# Patient Record
Sex: Female | Born: 1980 | Race: Black or African American | Hispanic: No | Marital: Single | Smoking: Former smoker
Health system: Southern US, Community
[De-identification: ages and names within clinical notes are randomized; demographics above are authoritative.]

## PROBLEM LIST (undated history)

## (undated) ENCOUNTER — Emergency Department: Admission: EM | Payer: Managed Care, Other (non HMO) | Source: Home / Self Care

## (undated) DIAGNOSIS — E785 Hyperlipidemia, unspecified: Secondary | ICD-10-CM

## (undated) DIAGNOSIS — F419 Anxiety disorder, unspecified: Secondary | ICD-10-CM

## (undated) DIAGNOSIS — R87619 Unspecified abnormal cytological findings in specimens from cervix uteri: Secondary | ICD-10-CM

## (undated) DIAGNOSIS — F32A Depression, unspecified: Secondary | ICD-10-CM

## (undated) DIAGNOSIS — J189 Pneumonia, unspecified organism: Secondary | ICD-10-CM

## (undated) DIAGNOSIS — I10 Essential (primary) hypertension: Secondary | ICD-10-CM

## (undated) DIAGNOSIS — E559 Vitamin D deficiency, unspecified: Secondary | ICD-10-CM

## (undated) DIAGNOSIS — R7303 Prediabetes: Secondary | ICD-10-CM

## (undated) DIAGNOSIS — D509 Iron deficiency anemia, unspecified: Secondary | ICD-10-CM

## (undated) HISTORY — DX: Unspecified abnormal cytological findings in specimens from cervix uteri: R87.619

## (undated) HISTORY — PX: NO PAST SURGERIES: SHX2092

## (undated) HISTORY — DX: Essential (primary) hypertension: I10

---

## 2004-10-06 ENCOUNTER — Other Ambulatory Visit: Admission: RE | Admit: 2004-10-06 | Discharge: 2004-10-06 | Payer: Self-pay | Admitting: Family Medicine

## 2007-10-27 ENCOUNTER — Emergency Department (HOSPITAL_COMMUNITY): Admission: EM | Admit: 2007-10-27 | Discharge: 2007-10-27 | Payer: Self-pay | Admitting: Family Medicine

## 2010-01-06 ENCOUNTER — Emergency Department (HOSPITAL_COMMUNITY): Admission: EM | Admit: 2010-01-06 | Discharge: 2010-01-06 | Payer: Self-pay | Admitting: Emergency Medicine

## 2011-02-25 LAB — POCT PREGNANCY, URINE
Operator id: 247071
Preg Test, Ur: NEGATIVE

## 2014-11-19 ENCOUNTER — Ambulatory Visit: Payer: Self-pay | Admitting: Family Medicine

## 2014-11-29 ENCOUNTER — Other Ambulatory Visit: Payer: Self-pay | Admitting: Family Medicine

## 2014-11-29 MED ORDER — AMLODIPINE BESYLATE 10 MG PO TABS: 10.0000 mg | ORAL_TABLET | Freq: Every day | ORAL | Status: DC

## 2014-11-29 MED ORDER — CHLORTHALIDONE 25 MG PO TABS: 25.0000 mg | ORAL_TABLET | Freq: Every day | ORAL | Status: DC

## 2014-11-29 NOTE — Telephone Encounter (Signed)
Routing to provider  

## 2014-11-29 NOTE — Telephone Encounter (Signed)
Pt called stated she needs a refill on Chlorthalidone and Amlodipine. [facility] [place]

## 2014-12-14 ENCOUNTER — Ambulatory Visit (INDEPENDENT_AMBULATORY_CARE_PROVIDER_SITE_OTHER): Payer: 59 | Admitting: Family Medicine

## 2014-12-14 ENCOUNTER — Encounter: Payer: Self-pay | Admitting: Family Medicine

## 2014-12-14 VITALS — BP 143/87 | HR 92 | Temp 98.8°F | Ht 66.75 in | Wt 327.0 lb

## 2014-12-14 DIAGNOSIS — Z0000 Encounter for general adult medical examination without abnormal findings: Secondary | ICD-10-CM

## 2014-12-14 DIAGNOSIS — I10 Essential (primary) hypertension: Secondary | ICD-10-CM | POA: Diagnosis not present

## 2014-12-14 DIAGNOSIS — Z23 Encounter for immunization: Secondary | ICD-10-CM

## 2014-12-14 DIAGNOSIS — R87810 Cervical high risk human papillomavirus (HPV) DNA test positive: Secondary | ICD-10-CM

## 2014-12-14 MED ORDER — CHLORTHALIDONE 25 MG PO TABS: 37.5000 mg | ORAL_TABLET | Freq: Every day | ORAL | Status: DC

## 2014-12-14 MED ORDER — TETANUS-DIPHTH-ACELL PERTUSSIS 5-2.5-18.5 LF-MCG/0.5 IM SUSP
0.5000 mL | Freq: Once | INTRAMUSCULAR | Status: AC
  Administered 2014-12-14: 0.5 mL via INTRAMUSCULAR

## 2014-12-14 MED ORDER — AMLODIPINE BESYLATE 10 MG PO TABS: 10.0000 mg | ORAL_TABLET | Freq: Every day | ORAL | Status: DC

## 2014-12-14 NOTE — Progress Notes (Signed)
BP 143/87 mmHg  Pulse 92  Temp(Src) 98.8 F (37.1 C)  Ht 5' 6.75" (1.695 m)  Wt 327 lb (148.326 kg)  BMI 51.63 kg/m2  SpO2 99%  LMP 11/25/2014 (Approximate)   Subjective:    Patient ID: Nancy Garza, female    DOB: Mar 15, 1981, 34 y.o.   MRN: [id number]  HPI: Nancy Garza is a 34 y.o. female  Chief Complaint  Patient presents with  . Annual Exam  abnormal pap smear; HPV positive; had LEEP in August 2013; none since then She is losing weight; up to 357 pounds and goes to the gym four days per week; drinking water now Tries to bake things; used to indulge in fried chicken, now just a special occasion now, maybe just on a Sunday She cannot recall her last tetanus  Depression screen PHQ 2/9 12/14/2014  Decreased Interest 0  Down, Depressed, Hopeless 0  PHQ - 2 Score 0   Relevant past medical, surgical, family and social history reviewed and updated as indicated. Interim medical history since our last visit reviewed. Allergies and medications reviewed and updated.  Review of Systems  Constitutional: Negative for fever and unexpected weight change.  HENT: Negative for sore throat.   Respiratory: Negative for shortness of breath.   Cardiovascular: Negative for chest pain.  Gastrointestinal: Negative for nausea, diarrhea and blood in stool.  Endocrine: Negative for cold intolerance, heat intolerance, polydipsia, polyphagia and polyuria.  Genitourinary: Negative for dysuria, frequency, hematuria, vaginal bleeding, vaginal discharge and vaginal pain.  Neurological: Negative for headaches.  Hematological: Does not bruise/bleed easily.   Per HPI unless specifically indicated above     Objective:    BP 143/87 mmHg  Pulse 92  Temp(Src) 98.8 F (37.1 C)  Ht 5' 6.75" (1.695 m)  Wt 327 lb (148.326 kg)  BMI 51.63 kg/m2  SpO2 99%  LMP 11/25/2014 (Approximate)  Wt Readings from Last 3 Encounters:  12/14/14 327 lb (148.326 kg)  10/05/14 347 lb (157.398 kg)     Physical Exam  Constitutional: She appears well-developed and well-nourished.  HENT:  Head: Normocephalic and atraumatic.  Eyes: Conjunctivae and EOM are normal. Right eye exhibits no hordeolum. Left eye exhibits no hordeolum. No scleral icterus.  Neck: Carotid bruit is not present. No thyromegaly present.  Cardiovascular: Normal rate, regular rhythm, S1 normal, S2 normal and normal heart sounds.   No extrasystoles are present.  Pulmonary/Chest: Effort normal and breath sounds normal. No respiratory distress. Right breast exhibits no inverted nipple, no mass, no nipple discharge, no skin change and no tenderness. Left breast exhibits no inverted nipple, no mass, no nipple discharge, no skin change and no tenderness. Breasts are symmetrical.  Abdominal: Soft. Normal appearance and bowel sounds are normal. She exhibits no distension, no abdominal bruit, no pulsatile midline mass and no mass. There is no hepatosplenomegaly. There is no tenderness. No hernia.  Genitourinary: Uterus normal. Pelvic exam was performed with patient prone. There is no rash or lesion on the right labia. There is no rash or lesion on the left labia. Cervix exhibits no motion tenderness. Right adnexum displays no mass, no tenderness and no fullness. Left adnexum displays no mass, no tenderness and no fullness.  Musculoskeletal: Normal range of motion. She exhibits no edema.  Lymphadenopathy:       Head (right side): No submandibular adenopathy present.       Head (left side): No submandibular adenopathy present.    She has no cervical adenopathy.    She  has no axillary adenopathy.  Neurological: She is alert. She displays no tremor. No cranial nerve deficit. She exhibits normal muscle tone. Gait normal.  Skin: Skin is warm and dry. No bruising and no ecchymosis noted. No cyanosis. No pallor.  Psychiatric: Her speech is normal and behavior is normal. Thought content normal. Her mood appears not anxious. She does not exhibit a  depressed mood.      Assessment & Plan:   Problem List Items Addressed This Visit      Cardiovascular and Mediastinum   Benign hypertension   Relevant Medications   chlorthalidone (HYGROTON) 25 MG tablet   amLODipine (NORVASC) 10 MG tablet     Other   Morbid obesity    Praised patient for her weight loss efforts, encouragement given       Other Visit Diagnoses    Health maintenance examination    -  Primary    healthy living encouraged; age-appropriate guidance, counseling    Relevant Medications    Tdap (BOOSTRIX) injection 0.5 mL (Completed)    Need for prophylactic vaccination with combined diphtheria-tetanus-pertussis (DTP) vaccine        vaccine given; tetanus good for up to 10 years; pertussis good for life per current ACIP guidelines    Relevant Medications    Tdap (BOOSTRIX) injection 0.5 mL (Completed)    Cervical high risk HPV (human papillomavirus) test positive        thin prep collected    Relevant Orders    Pap liquid-based and HPV (high risk)       Follow up plan: Return in about 1 month (around 01/14/2015) for high blood pressure and fasting labs; return in 12+ months for physical.  Meds ordered this encounter  Medications  . Tdap (BOOSTRIX) injection 0.5 mL    Sig:   . DISCONTD: chlorthalidone (HYGROTON) 25 MG tablet    Sig: Take 1.5 tablets (37.5 mg total) by mouth daily.    Dispense:  45 tablet    Refill:  2  . chlorthalidone (HYGROTON) 25 MG tablet    Sig: Take 1.5 tablets (37.5 mg total) by mouth daily.    Dispense:  135 tablet    Refill:  1  . amLODipine (NORVASC) 10 MG tablet    Sig: Take 1 tablet (10 mg total) by mouth daily.    Dispense:  90 tablet    Refill:  3

## 2014-12-14 NOTE — Patient Instructions (Addendum)
You received the tetanus-diphtheria-pertussis vaccine today The tetanus-diphtheria portion is good for up to 10 years The pertussis component should be good for life Let's increase the chlorthalidone to 1.5 pills daily (37.5 mg) Return for recheck of blood pressure and fasting labs in 1 month Return in 12 months for next physical  Health Maintenance Adopting a healthy lifestyle and getting preventive care can go a long way to promote health and wellness. Talk with your health care provider about what schedule of regular examinations is right for you. This is a good chance for you to check in with your provider about disease prevention and staying healthy. In between checkups, there are plenty of things you can do on your own. Experts have done a lot of research about which lifestyle changes and preventive measures are most likely to keep you healthy. Ask your health care provider for more information. WEIGHT AND DIET  Eat a healthy diet  Be sure to include plenty of vegetables, fruits, low-fat dairy products, and lean protein.  Do not eat a lot of foods high in solid fats, added sugars, or salt.  Get regular exercise. This is one of the most important things you can do for your health.  Most adults should exercise for at least 150 minutes each week. The exercise should increase your heart rate and make you sweat (moderate-intensity exercise).  Most adults should also do strengthening exercises at least twice a week. This is in addition to the moderate-intensity exercise.  Maintain a healthy weight  Body mass index (BMI) is a measurement that can be used to identify possible weight problems. It estimates body fat based on height and weight. Your health care provider can help determine your BMI and help you achieve or maintain a healthy weight.  For females 68 years of age and older:   A BMI below 18.5 is considered underweight.  A BMI of 18.5 to 24.9 is normal.  A BMI of 25 to 29.9  is considered overweight.  A BMI of 30 and above is considered obese.  Watch levels of cholesterol and blood lipids  You should start having your blood tested for lipids and cholesterol at 33 years of age, then have this test every 5 years.  You may need to have your cholesterol levels checked more often if:  Your lipid or cholesterol levels are high.  You are older than 33 years of age.  You are at high risk for heart disease.  CANCER SCREENING   Lung Cancer  Lung cancer screening is recommended for adults 73-76 years old who are at high risk for lung cancer because of a history of smoking.  A yearly low-dose CT scan of the lungs is recommended for people who:  Currently smoke.  Have quit within the past 15 years.  Have at least a 30-pack-year history of smoking. A pack year is smoking an average of one pack of cigarettes a day for 1 year.  Yearly screening should continue until it has been 15 years since you quit.  Yearly screening should stop if you develop a health problem that would prevent you from having lung cancer treatment.  Breast Cancer  Practice breast self-awareness. This means understanding how your breasts normally appear and feel.  It also means doing regular breast self-exams. Let your health care provider know about any changes, no matter how small.  If you are in your 20s or 30s, you should have a clinical breast exam (CBE) by a health care provider  every 1-3 years as part of a regular health exam.  If you are 31 or older, have a CBE every year. Also consider having a breast X-ray (mammogram) every year.  If you have a family history of breast cancer, talk to your health care provider about genetic screening.  If you are at high risk for breast cancer, talk to your health care provider about having an MRI and a mammogram every year.  Breast cancer gene (BRCA) assessment is recommended for women who have family members with BRCA-related cancers.  BRCA-related cancers include:  Breast.  Ovarian.  Tubal.  Peritoneal cancers.  Results of the assessment will determine the need for genetic counseling and BRCA1 and BRCA2 testing. Cervical Cancer Routine pelvic examinations to screen for cervical cancer are no longer recommended for nonpregnant women who are considered low risk for cancer of the pelvic organs (ovaries, uterus, and vagina) and who do not have symptoms. A pelvic examination may be necessary if you have symptoms including those associated with pelvic infections. Ask your health care provider if a screening pelvic exam is right for you.   The Pap test is the screening test for cervical cancer for women who are considered at risk.  If you had a hysterectomy for a problem that was not cancer or a condition that could lead to cancer, then you no longer need Pap tests.  If you are older than 65 years, and you have had normal Pap tests for the past 10 years, you no longer need to have Pap tests.  If you have had past treatment for cervical cancer or a condition that could lead to cancer, you need Pap tests and screening for cancer for at least 20 years after your treatment.  If you no longer get a Pap test, assess your risk factors if they change (such as having a new sexual partner). This can affect whether you should start being screened again.  Some women have medical problems that increase their chance of getting cervical cancer. If this is the case for you, your health care provider may recommend more frequent screening and Pap tests.  The human papillomavirus (HPV) test is another test that may be used for cervical cancer screening. The HPV test looks for the virus that can cause cell changes in the cervix. The cells collected during the Pap test can be tested for HPV.  The HPV test can be used to screen women 81 years of age and older. Getting tested for HPV can extend the interval between normal Pap tests from three to  five years.  An HPV test also should be used to screen women of any age who have unclear Pap test results.  After 33 years of age, women should have HPV testing as often as Pap tests.  Colorectal Cancer  This type of cancer can be detected and often prevented.  Routine colorectal cancer screening usually begins at 33 years of age and continues through 33 years of age.  Your health care provider may recommend screening at an earlier age if you have risk factors for colon cancer.  Your health care provider may also recommend using home test kits to check for hidden blood in the stool.  A small camera at the end of a tube can be used to examine your colon directly (sigmoidoscopy or colonoscopy). This is done to check for the earliest forms of colorectal cancer.  Routine screening usually begins at age 72.  Direct examination of the colon should  be repeated every 5-10 years through 33 years of age. However, you may need to be screened more often if early forms of precancerous polyps or small growths are found. Skin Cancer  Check your skin from head to toe regularly.  Tell your health care provider about any new moles or changes in moles, especially if there is a change in a mole's shape or color.  Also tell your health care provider if you have a mole that is larger than the size of a pencil eraser.  Always use sunscreen. Apply sunscreen liberally and repeatedly throughout the day.  Protect yourself by wearing long sleeves, pants, a wide-brimmed hat, and sunglasses whenever you are outside. HEART DISEASE, DIABETES, AND HIGH BLOOD PRESSURE   Have your blood pressure checked at least every 1-2 years. High blood pressure causes heart disease and increases the risk of stroke.  If you are between 62 years and 52 years old, ask your health care provider if you should take aspirin to prevent strokes.  Have regular diabetes screenings. This involves taking a blood sample to check your  fasting blood sugar level.  If you are at a normal weight and have a low risk for diabetes, have this test once every three years after 33 years of age.  If you are overweight and have a high risk for diabetes, consider being tested at a younger age or more often. PREVENTING INFECTION  Hepatitis B  If you have a higher risk for hepatitis B, you should be screened for this virus. You are considered at high risk for hepatitis B if:  You were born in a country where hepatitis B is common. Ask your health care provider which countries are considered high risk.  Your parents were born in a high-risk country, and you have not been immunized against hepatitis B (hepatitis B vaccine).  You have HIV or AIDS.  You use needles to inject street drugs.  You live with someone who has hepatitis B.  You have had sex with someone who has hepatitis B.  You get hemodialysis treatment.  You take certain medicines for conditions, including cancer, organ transplantation, and autoimmune conditions. Hepatitis C  Blood testing is recommended for:  Everyone born from [birth year] through 1964.  Anyone with known risk factors for hepatitis C. Sexually transmitted infections (STIs)  You should be screened for sexually transmitted infections (STIs) including gonorrhea and chlamydia if:  You are sexually active and are younger than 33 years of age.  You are older than 33 years of age and your health care provider tells you that you are at risk for this type of infection.  Your sexual activity has changed since you were last screened and you are at an increased risk for chlamydia or gonorrhea. Ask your health care provider if you are at risk.  If you do not have HIV, but are at risk, it may be recommended that you take a prescription medicine daily to prevent HIV infection. This is called pre-exposure prophylaxis (PrEP). You are considered at risk if:  You are sexually active and do not regularly use condoms or  know the HIV status of your partner(s).  You take drugs by injection.  You are sexually active with a partner who has HIV. Talk with your health care provider about whether you are at high risk of being infected with HIV. If you choose to begin PrEP, you should first be tested for HIV. You should then be tested every 3 months for as  long as you are taking PrEP.  PREGNANCY   If you are premenopausal and you may become pregnant, ask your health care provider about preconception counseling.  If you may become pregnant, take 400 to 800 micrograms (mcg) of folic acid every day.  If you want to prevent pregnancy, talk to your health care provider about birth control (contraception). OSTEOPOROSIS AND MENOPAUSE   Osteoporosis is a disease in which the bones lose minerals and strength with aging. This can result in serious bone fractures. Your risk for osteoporosis can be identified using a bone density scan.  If you are 24 years of age or older, or if you are at risk for osteoporosis and fractures, ask your health care provider if you should be screened.  Ask your health care provider whether you should take a calcium or vitamin D supplement to lower your risk for osteoporosis.  Menopause may have certain physical symptoms and risks.  Hormone replacement therapy may reduce some of these symptoms and risks. Talk to your health care provider about whether hormone replacement therapy is right for you.  HOME CARE INSTRUCTIONS   Schedule regular health, dental, and eye exams.  Stay current with your immunizations.   Do not use any tobacco products including cigarettes, chewing tobacco, or electronic cigarettes.  If you are pregnant, do not drink alcohol.  If you are breastfeeding, limit how much and how often you drink alcohol.  Limit alcohol intake to no more than 1 drink per day for nonpregnant women. One drink equals 12 ounces of beer, 5 ounces of wine, or 1 ounces of hard liquor.  Do  not use street drugs.  Do not share needles.  Ask your health care provider for help if you need support or information about quitting drugs.  Tell your health care provider if you often feel depressed.  Tell your health care provider if you have ever been abused or do not feel safe at home. Document Released: 12/01/2010 Document Revised: 10/02/2013 Document Reviewed: 04/19/2013 [facility] Patient Information 2015 [place], [place]. This information is not intended to replace advice given to you by your health care provider. Make sure you discuss any questions you have with your health care provider.

## 2014-12-16 DIAGNOSIS — I10 Essential (primary) hypertension: Secondary | ICD-10-CM | POA: Insufficient documentation

## 2014-12-16 NOTE — Assessment & Plan Note (Signed)
Praised patient for her weight loss efforts, encouragement given

## 2014-12-19 LAB — PAP LB AND HPV HIGH-RISK
HPV, high-risk: NEGATIVE
PAP Smear Comment: 0

## 2015-01-01 ENCOUNTER — Other Ambulatory Visit: Payer: Self-pay | Admitting: Family Medicine

## 2015-01-01 MED ORDER — CHLORTHALIDONE 25 MG PO TABS: 37.5000 mg | ORAL_TABLET | Freq: Every day | ORAL | Status: DC

## 2015-01-01 MED ORDER — AMLODIPINE BESYLATE 10 MG PO TABS: 10.0000 mg | ORAL_TABLET | Freq: Every day | ORAL | Status: DC

## 2015-01-01 NOTE — Telephone Encounter (Signed)
Pt called needs refills Amlodipine and hygroton. Pharm is [facility]. [facility] stated [facility]es with no response. Thanks.

## 2015-01-01 NOTE — Telephone Encounter (Signed)
She had rx's sent to local pharmacy, but they need [facility] with [facility] supply.

## 2015-01-02 ENCOUNTER — Telehealth: Payer: Self-pay

## 2015-01-02 NOTE — Telephone Encounter (Signed)
Pt called stated her last visit was supposed to be coded as a physical but it was coded as  R87.810. Could you please look into this for this pt and call her @ [phone]. Thanks.

## 2015-01-18 ENCOUNTER — Ambulatory Visit: Payer: 59 | Admitting: Family Medicine

## 2015-01-25 ENCOUNTER — Encounter: Payer: Self-pay | Admitting: Family Medicine

## 2015-01-25 ENCOUNTER — Ambulatory Visit (INDEPENDENT_AMBULATORY_CARE_PROVIDER_SITE_OTHER): Payer: 59 | Admitting: Family Medicine

## 2015-01-25 VITALS — BP 141/83 | HR 83 | Temp 97.7°F | Wt 314.0 lb

## 2015-01-25 DIAGNOSIS — Z0000 Encounter for general adult medical examination without abnormal findings: Secondary | ICD-10-CM | POA: Insufficient documentation

## 2015-01-25 DIAGNOSIS — I10 Essential (primary) hypertension: Secondary | ICD-10-CM | POA: Diagnosis not present

## 2015-01-25 NOTE — Progress Notes (Signed)
BP 141/83 mmHg  Pulse 83  Temp(Src) 97.7 F (36.5 C)  Wt 314 lb (142.429 kg)  SpO2 100%  LMP 01/04/2015 (Approximate)   Subjective:    Patient ID: Nancy Garza, female    DOB: 08-23-1980, 34 y.o.   MRN: [id number]  HPI: Nancy Garza is a 34 y.o. female  Chief Complaint  Patient presents with  . Hypertension  . Obesity    She has lost 13 pounds since her last appointment here!   She is eating right, trying to cut down on things; not depriving herself She doesn't eat fried chicken like she used to; she is allowing herself to have a little something; learning that it's how much, and really limiting portions; she not telling people she is on a diet She is working out 3-4 times a week; no limitations, no chest pain Had a rough day yesterday, all she does is work, works 8-5, gets off at 7 sometimes if she does overtime; goes to [employer], gets ready for the next day, like a constant cycle; she thought this is mental and working on herself physically and mentally  Relevant past medical, surgical, family and social history reviewed and updated as indicated. Interim medical history since our last visit reviewed. Allergies and medications reviewed and updated.  Review of Systems  Constitutional: Negative for fever and unexpected weight change (the weight change is all expected and hard-earned).  Respiratory: Negative for shortness of breath.   Cardiovascular: Negative for chest pain and leg swelling.  Endocrine: Negative for polydipsia.  Psychiatric/Behavioral: The patient is not nervous/anxious and is not hyperactive.    Per HPI unless specifically indicated above     Objective:    BP 141/83 mmHg  Pulse 83  Temp(Src) 97.7 F (36.5 C)  Wt 314 lb (142.429 kg)  SpO2 100%  LMP 01/04/2015 (Approximate)  Wt Readings from Last 3 Encounters:  01/25/15 314 lb (142.429 kg)  12/14/14 327 lb (148.326 kg)  10/05/14 347 lb (157.398 kg)    Physical Exam  Constitutional: She  appears well-developed and well-nourished.  Morbidly obese  HENT:  Mouth/Throat: Mucous membranes are normal.  Eyes: EOM are normal. No scleral icterus.  Cardiovascular: Normal rate and regular rhythm.   Pulmonary/Chest: Effort normal and breath sounds normal.  Psychiatric: She has a normal mood and affect. Her behavior is normal.      Assessment & Plan:   Problem List Items Addressed This Visit      Cardiovascular and Mediastinum   Benign hypertension    Continue current meds; weight loss will definitely help; monitor BP at home and if over 140, then call and we'll increase the chlorthalidone from 37.5 to 50 mg daily, but I think she can do this on her own        Other   Morbid obesity    So excited at her efforts for eating better and losing weight and exercising; continue weight loss journey, physical and mental process discussed; weight loss should also help lower pressures      Preventative health care - Primary    Just entered for lab orders      Relevant Orders   Comprehensive metabolic panel   CBC with Differential/Platelet   TSH   Lipid Panel w/o Chol/HDL Ratio      Follow up plan: Return in about 3 months (around 04/27/2015) for HTN.  Orders Placed This Encounter  Procedures  . Comprehensive metabolic panel  . CBC with Differential/Platelet  .  TSH  . Lipid Panel w/o Chol/HDL Ratio

## 2015-01-25 NOTE — Assessment & Plan Note (Signed)
Continue current meds; weight loss will definitely help; monitor BP at home and if over 140, then call and we'll increase the chlorthalidone from 37.5 to 50 mg daily, but I think she can do this on her own

## 2015-01-25 NOTE — Patient Instructions (Signed)
I am so proud of your efforts We'll check labs today Check your blood pressure maybe once or twice a week, and call me if not consistently under 140 Keep trying to follow the DASH guidelines Return in 3 months, but call sooner if needed

## 2015-01-25 NOTE — Assessment & Plan Note (Signed)
Just entered for lab orders

## 2015-01-25 NOTE — Assessment & Plan Note (Signed)
So excited at her efforts for eating better and losing weight and exercising; continue weight loss journey, physical and mental process discussed; weight loss should also help lower pressures

## 2015-01-26 LAB — CBC WITH DIFFERENTIAL/PLATELET
BASOS: 0 %
Basophils Absolute: 0 10*3/uL (ref 0.0–0.2)
EOS (ABSOLUTE): 0.1 10*3/uL (ref 0.0–0.4)
EOS: 2 %
HEMATOCRIT: 37.4 % (ref 34.0–46.6)
HEMOGLOBIN: 12.5 g/dL (ref 11.1–15.9)
IMMATURE GRANS (ABS): 0 10*3/uL (ref 0.0–0.1)
Immature Granulocytes: 0 %
LYMPHS: 33 %
Lymphocytes Absolute: 2 10*3/uL (ref 0.7–3.1)
MCH: 29.5 pg (ref 26.6–33.0)
MCHC: 33.4 g/dL (ref 31.5–35.7)
MCV: 88 fL (ref 79–97)
Monocytes Absolute: 0.4 10*3/uL (ref 0.1–0.9)
Monocytes: 7 %
NEUTROS ABS: 3.5 10*3/uL (ref 1.4–7.0)
Neutrophils: 58 %
Platelets: 406 10*3/uL — ABNORMAL HIGH (ref 150–379)
RBC: 4.24 x10E6/uL (ref 3.77–5.28)
RDW: 16.3 % — ABNORMAL HIGH (ref 12.3–15.4)
WBC: 6 10*3/uL (ref 3.4–10.8)

## 2015-01-26 LAB — COMPREHENSIVE METABOLIC PANEL
A/G RATIO: 1.4 (ref 1.1–2.5)
ALT: 20 IU/L (ref 0–32)
AST: 19 IU/L (ref 0–40)
Albumin: 4.4 g/dL (ref 3.5–5.5)
Alkaline Phosphatase: 53 IU/L (ref 39–117)
BUN/Creatinine Ratio: 16 (ref 8–20)
BUN: 14 mg/dL (ref 6–20)
Bilirubin Total: 0.4 mg/dL (ref 0.0–1.2)
CALCIUM: 9.8 mg/dL (ref 8.7–10.2)
CO2: 28 mmol/L (ref 18–29)
Chloride: 92 mmol/L — ABNORMAL LOW (ref 97–108)
Creatinine, Ser: 0.87 mg/dL (ref 0.57–1.00)
GFR, EST AFRICAN AMERICAN: 102 mL/min/{1.73_m2} (ref >59)
GFR, EST NON AFRICAN AMERICAN: 88 mL/min/{1.73_m2} (ref >59)
GLOBULIN, TOTAL: 3.1 g/dL (ref 1.5–4.5)
Glucose: 92 mg/dL (ref 65–99)
POTASSIUM: 3.7 mmol/L (ref 3.5–5.2)
Sodium: 137 mmol/L (ref 134–144)
TOTAL PROTEIN: 7.5 g/dL (ref 6.0–8.5)

## 2015-01-26 LAB — LIPID PANEL W/O CHOL/HDL RATIO
Cholesterol, Total: 215 mg/dL — ABNORMAL HIGH (ref 100–199)
HDL: 33 mg/dL — AB (ref >39)
LDL CALC: 154 mg/dL — AB (ref 0–99)
Triglycerides: 139 mg/dL (ref 0–149)
VLDL CHOLESTEROL CAL: 28 mg/dL (ref 5–40)

## 2015-01-26 LAB — TSH: TSH: 2.11 u[IU]/mL (ref 0.450–4.500)

## 2015-01-28 ENCOUNTER — Encounter: Payer: Self-pay | Admitting: Family Medicine

## 2015-01-29 ENCOUNTER — Telehealth: Payer: Self-pay | Admitting: Family Medicine

## 2015-01-29 NOTE — Telephone Encounter (Signed)
Pt called stated pharmacy has not received RX for 30 day supply of Chlorthalidone and amlodopine. [facility] [phone]. Thanks. Please send ASAP.

## 2015-01-30 NOTE — Telephone Encounter (Signed)
I called in rx's to [facility].

## 2015-01-30 NOTE — Telephone Encounter (Signed)
Per our records, [facility] did reciece rx's. Left message for patient to call.

## 2015-02-06 ENCOUNTER — Other Ambulatory Visit: Payer: Self-pay | Admitting: Family Medicine

## 2015-03-18 ENCOUNTER — Other Ambulatory Visit: Payer: Self-pay | Admitting: Family Medicine

## 2015-03-18 NOTE — Telephone Encounter (Signed)
Your patient 

## 2015-03-18 NOTE — Telephone Encounter (Signed)
Next appt late Nov; Rxs approved

## 2015-05-03 ENCOUNTER — Ambulatory Visit: Payer: 59 | Admitting: Family Medicine

## 2015-06-11 ENCOUNTER — Telehealth: Payer: Self-pay | Admitting: Family Medicine

## 2015-06-11 NOTE — Telephone Encounter (Signed)
Spoke with patient and stated that she will call us back to schedule f/u appt with fasting labs because she will have to look at her work schedule first, thanks.

## 2015-06-11 NOTE — Telephone Encounter (Signed)
Please let Mahlon GammonMunirah K Allmon know that I'd like to see patient for an appointment here in the office for:  Obesity, hypertension Please schedule a visit with me  in the next: few weeks Fasting?  Yes please Thank you, Dr. Sherie DonLada She no showed for her appt in December

## 2015-06-18 ENCOUNTER — Encounter: Payer: Self-pay | Admitting: Family Medicine

## 2015-12-11 ENCOUNTER — Telehealth: Payer: Self-pay | Admitting: Family Medicine

## 2015-12-11 NOTE — Telephone Encounter (Signed)
I'm going to suggest she has an appt, either with me here or urgent care or GYN; I can't really diagnose this over the phone; sorry

## 2015-12-11 NOTE — Telephone Encounter (Signed)
Pt called with concerns: pt states when she wipes she is seeing blood. It should not be time for her menstrual. Pt is scard and would like to be advised.

## 2015-12-11 NOTE — Telephone Encounter (Signed)
Called to get more info.  She states it is not coming from rectum and denies any pain not even burning with urination.  She states it is only when she wipes and is bright red.  She put a pad on and it is not on the pad she states it has been on and off since Monday? She is not suppost to start period until the 16th

## 2015-12-11 NOTE — Telephone Encounter (Signed)
Pt will be seen Monday for an appt

## 2015-12-16 ENCOUNTER — Ambulatory Visit: Payer: 59 | Admitting: Family Medicine

## 2016-01-07 ENCOUNTER — Encounter: Payer: 59 | Admitting: Family Medicine

## 2016-04-13 ENCOUNTER — Encounter: Payer: 59 | Admitting: Family Medicine

## 2017-01-18 ENCOUNTER — Encounter: Payer: 59 | Admitting: Family Medicine

## 2017-01-20 ENCOUNTER — Encounter: Payer: 59 | Admitting: Family Medicine

## 2017-01-27 ENCOUNTER — Encounter: Payer: Self-pay | Admitting: Family Medicine

## 2017-01-27 ENCOUNTER — Ambulatory Visit (INDEPENDENT_AMBULATORY_CARE_PROVIDER_SITE_OTHER): Payer: 59 | Admitting: Family Medicine

## 2017-01-27 VITALS — BP 168/112 | HR 97 | Ht 68.0 in | Wt 359.0 lb

## 2017-01-27 DIAGNOSIS — Z1329 Encounter for screening for other suspected endocrine disorder: Secondary | ICD-10-CM | POA: Diagnosis not present

## 2017-01-27 DIAGNOSIS — R635 Abnormal weight gain: Secondary | ICD-10-CM | POA: Diagnosis not present

## 2017-01-27 DIAGNOSIS — Z0000 Encounter for general adult medical examination without abnormal findings: Secondary | ICD-10-CM

## 2017-01-27 DIAGNOSIS — Z114 Encounter for screening for human immunodeficiency virus [HIV]: Secondary | ICD-10-CM

## 2017-01-27 DIAGNOSIS — Z13220 Encounter for screening for lipoid disorders: Secondary | ICD-10-CM | POA: Diagnosis not present

## 2017-01-27 DIAGNOSIS — I10 Essential (primary) hypertension: Secondary | ICD-10-CM

## 2017-01-27 DIAGNOSIS — Z131 Encounter for screening for diabetes mellitus: Secondary | ICD-10-CM

## 2017-01-27 LAB — URINALYSIS, ROUTINE W REFLEX MICROSCOPIC
Bilirubin, UA: NEGATIVE
GLUCOSE, UA: NEGATIVE
Ketones, UA: NEGATIVE
LEUKOCYTES UA: NEGATIVE
Nitrite, UA: NEGATIVE
RBC, UA: NEGATIVE
Specific Gravity, UA: 1.015 (ref 1.005–1.030)
UUROB: 0.2 mg/dL (ref 0.2–1.0)
pH, UA: 7 (ref 5.0–7.5)

## 2017-01-27 LAB — MICROSCOPIC EXAMINATION: BACTERIA UA: NONE SEEN

## 2017-01-27 MED ORDER — AMLODIPINE BESYLATE 10 MG PO TABS: 10.0000 mg | ORAL_TABLET | Freq: Every day | ORAL | 0 refills | Status: DC

## 2017-01-27 MED ORDER — CHLORTHALIDONE 25 MG PO TABS: ORAL_TABLET | ORAL | 0 refills | Status: DC

## 2017-01-27 NOTE — Progress Notes (Signed)
BP (!) 168/112   Pulse 97   Ht 5\' 8"  (1.727 m)   Wt (!) 359 lb (162.8 kg)   LMP 01/21/2017 (Exact Date)   SpO2 98%   BMI 54.59 kg/m    Subjective:    Patient ID: Nancy Garza, female    DOB: May 25, 1981, 36 y.o.   MRN: [id number]  HPI: Nancy Garza is a 36 y.o. female presenting on 01/27/2017 for comprehensive medical examination. Current medical complaints include:weight concerns. A year or so ago she was regularly exercising and eating a very healthy diet, but has since fallen off track and gained about 60 lb. Wanting very much to make a change, and has a friend on phentermine who is doing well.   Also notes hx of HTN and leg swelling. Never came back for her follow up back in 2016, but was previously under good control on amlodipine 10 mg and hygroton 25mg . Knows BPs have been high since being off medicines. Also aware she needs to make some lifestyle changes.   Fasting for labs today.   She currently lives with: fiance  Depression Screen done today and results listed below:  Depression screen [facility] 2/9 01/27/2017 12/14/2014  Decreased Interest 0 0  Down, Depressed, Hopeless 0 0  PHQ - 2 Score 0 0    The patient does not have a history of falls. I did not complete a risk assessment for falls. A plan of care for falls was not documented.   Past Medical History:  Past Medical History:  Diagnosis Date  . Abnormal Pap smear of cervix    LEEP procedure  . Hypertension     Surgical History:  No past surgical history on file.  Medications:  No current outpatient prescriptions on file prior to visit.   No current facility-administered medications on file prior to visit.     Allergies:  No Known Allergies  Social History:  Social History   Social History  . Marital status: Single    Spouse name: N/A  . Number of children: N/A  . Years of education: N/A   Occupational History  . Not on file.   Social History Main Topics  . Smoking status: Current Some  Day Smoker  . Smokeless tobacco: Never Used  . Alcohol use No     Comment: on the weekends  . Drug use: Yes    Types: Marijuana     Comment: history of marijuana use  . Sexual activity: Not on file   Other Topics Concern  . Not on file   Social History Narrative  . No narrative on file   History  Smoking Status  . Current Some Day Smoker  Smokeless Tobacco  . Never Used   History  Alcohol Use No    Comment: on the weekends    Family History:  Family History  Problem Relation Age of Onset  . Diabetes Mother   . Hypertension Mother   . Stroke Mother   . COPD Mother   . Hypertension Father   . Diabetes Maternal Grandmother   . Hypertension Maternal Grandmother   . Stroke Maternal Grandmother   . Diabetes Paternal Grandmother   . Hypertension Paternal Grandmother   . Stroke Paternal Grandmother     Past medical history, surgical history, medications, allergies, family history and social history reviewed with patient today and changes made to appropriate areas of the chart.   Review of Systems - General ROS: positive for  - weight gain  Psychological ROS: negative Ophthalmic ROS: negative ENT ROS: negative Breast ROS: negative for breast lumps Respiratory ROS: no cough, shortness of breath, or wheezing Cardiovascular ROS: no chest pain or dyspnea on exertion Gastrointestinal ROS: no abdominal pain, change in bowel habits, or black or bloody stools Genito-Urinary ROS: no dysuria, trouble voiding, or hematuria Musculoskeletal ROS: negative Neurological ROS: no TIA or stroke symptoms Dermatological ROS: negative All other ROS negative except what is listed above and in the HPI.      Objective:    BP (!) 168/112   Pulse 97   Ht 5\' 8"  (1.727 m)   Wt (!) 359 lb (162.8 kg)   LMP 01/21/2017 (Exact Date)   SpO2 98%   BMI 54.59 kg/m   Wt Readings from Last 3 Encounters:  01/27/17 (!) 359 lb (162.8 kg)  01/25/15 (!) 314 lb (142.4 kg)  12/14/14 (!) 327 lb (148.3  kg)    Physical Exam  Constitutional: She is oriented to person, place, and time. She appears well-developed. No distress.  HENT:  Head: Atraumatic.  Right Ear: External ear normal.  Left Ear: External ear normal.  Nose: Nose normal.  Mouth/Throat: Oropharynx is clear and moist. No oropharyngeal exudate.  Eyes: Pupils are equal, round, and reactive to light. Conjunctivae are normal. No scleral icterus.  Neck: Normal range of motion. Neck supple. No thyromegaly present.  Cardiovascular: Normal rate, regular rhythm, normal heart sounds and intact distal pulses.   Pulmonary/Chest: Effort normal and breath sounds normal. No respiratory distress. Right breast exhibits no mass, no nipple discharge, no skin change and no tenderness. Left breast exhibits no mass, no skin change and no tenderness.  Abdominal: Soft. Bowel sounds are normal. She exhibits no mass. There is no tenderness.  Musculoskeletal: Normal range of motion. She exhibits no edema or tenderness.  Lymphadenopathy:    She has no axillary adenopathy.  Neurological: She is alert and oriented to person, place, and time. No cranial nerve deficit.  Skin: Skin is warm and dry. No rash noted.  Psychiatric: She has a normal mood and affect. Her behavior is normal.  Nursing note and vitals reviewed.  Results for orders placed or performed in visit on 01/27/17  Microscopic Examination  Result Value Ref Range   WBC, UA 0-5 0 - 5 /hpf   RBC, UA 0-2 0 - 2 /hpf   Epithelial Cells (non renal) 0-10 0 - 10 /hpf   Bacteria, UA None seen None seen/Few  CBC with Differential/Platelet  Result Value Ref Range   WBC 5.5 3.4 - 10.8 x10E3/uL   RBC 4.10 3.77 - 5.28 x10E6/uL   Hemoglobin 11.0 (L) 11.1 - 15.9 g/dL   Hematocrit 16.1 (L) 09.6 - 46.6 %   MCV 82 79 - 97 fL   MCH 26.8 26.6 - 33.0 pg   MCHC 32.6 31.5 - 35.7 g/dL   RDW 04.5 (H) 40.9 - 81.1 %   Platelets 437 (H) 150 - 379 x10E3/uL   Neutrophils 52 Not Estab. %   Lymphs 35 Not Estab. %    Monocytes 6 Not Estab. %   Eos 6 Not Estab. %   Basos 1 Not Estab. %   Neutrophils Absolute 2.8 1.4 - 7.0 x10E3/uL   Lymphocytes Absolute 1.9 0.7 - 3.1 x10E3/uL   Monocytes Absolute 0.3 0.1 - 0.9 x10E3/uL   EOS (ABSOLUTE) 0.3 0.0 - 0.4 x10E3/uL   Basophils Absolute 0.0 0.0 - 0.2 x10E3/uL   Immature Granulocytes 0 Not Estab. %   Immature  Grans (Abs) 0.0 0.0 - 0.1 x10E3/uL  Comprehensive metabolic panel  Result Value Ref Range   Glucose 91 65 - 99 mg/dL   BUN 15 6 - 20 mg/dL   Creatinine, Ser 1.61 0.57 - 1.00 mg/dL   GFR calc non Af Amer 94 >59 mL/min/1.73   GFR calc Af Amer 108 >59 mL/min/1.73   BUN/Creatinine Ratio 18 9 - 23   Sodium 138 134 - 144 mmol/L   Potassium 4.4 3.5 - 5.2 mmol/L   Chloride 99 96 - 106 mmol/L   CO2 26 20 - 29 mmol/L   Calcium 9.8 8.7 - 10.2 mg/dL   Total Protein 7.6 6.0 - 8.5 g/dL   Albumin 4.2 3.5 - 5.5 g/dL   Globulin, Total 3.4 1.5 - 4.5 g/dL   Albumin/Globulin Ratio 1.2 1.2 - 2.2   Bilirubin Total <0.2 0.0 - 1.2 mg/dL   Alkaline Phosphatase 60 39 - 117 IU/L   AST 18 0 - 40 IU/L   ALT 19 0 - 32 IU/L  Lipid panel  Result Value Ref Range   Cholesterol, Total 174 100 - 199 mg/dL   Triglycerides 096 0 - 149 mg/dL   HDL 38 (L) >04 mg/dL   VLDL Cholesterol Cal 28 5 - 40 mg/dL   LDL Calculated 540 (H) 0 - 99 mg/dL   Chol/HDL Ratio 4.6 (H) 0.0 - 4.4 ratio  TSH  Result Value Ref Range   TSH 2.690 0.450 - 4.500 uIU/mL  Urinalysis, Routine w reflex microscopic  Result Value Ref Range   Specific Gravity, UA 1.015 1.005 - 1.030   pH, UA 7.0 5.0 - 7.5   Color, UA Yellow Yellow   Appearance Ur Clear Clear   Leukocytes, UA Negative Negative   Protein, UA 1+ (A) Negative/Trace   Glucose, UA Negative Negative   Ketones, UA Negative Negative   RBC, UA Negative Negative   Bilirubin, UA Negative Negative   Urobilinogen, Ur 0.2 0.2 - 1.0 mg/dL   Nitrite, UA Negative Negative   Microscopic Examination See below:   HIV antibody  Result Value Ref Range    HIV Screen 4th Generation wRfx Non Reactive Non Reactive      Assessment & Plan:   Problem List Items Addressed This Visit      Cardiovascular and Mediastinum   Benign hypertension    Restart previous regimen, DASH diet, weight loss. Compression stockings and elevation for leg swelling       Relevant Medications   amLODipine (NORVASC) 10 MG tablet   chlorthalidone (HYGROTON) 25 MG tablet   Other Relevant Orders   CBC with Differential/Platelet (Completed)    Other Visit Diagnoses    Annual physical exam    -  Primary   Await fasting lab results   Screening for diabetes mellitus (DM)       Relevant Orders   Comprehensive metabolic panel (Completed)   Urinalysis, Routine w reflex microscopic (Completed)   Screening cholesterol level       Relevant Orders   Lipid panel (Completed)   Thyroid disorder screen       Relevant Orders   TSH (Completed)   Encounter for screening for HIV       Relevant Orders   HIV antibody (Completed)   Weight gain       Long discussion about lifestyle changes. Offered referral to lifestyle center or OBGYN for phentermine. Pt opting to try on her own first     Wanting to think about a Lifestyle  Center referral   Follow up plan: Return in about 4 weeks (around 02/24/2017) for BP check.   LABORATORY TESTING:  - Pap smear: up to date, hx of abnormal pap but last pap came back normal in 2016 with negative HPV  IMMUNIZATIONS:   - Tdap: Tetanus vaccination status reviewed: last tetanus booster within 10 years. - Influenza: Postponed to flu season  PATIENT COUNSELING:   Advised to take 1 mg of folate supplement per day if capable of pregnancy.   Sexuality: Discussed sexually transmitted diseases, partner selection, use of condoms, avoidance of unintended pregnancy  and contraceptive alternatives.   Advised to avoid cigarette smoking.  I discussed with the patient that most people either abstain from alcohol or drink within safe limits  (<=14/week and <=4 drinks/occasion for males, <=7/weeks and <= 3 drinks/occasion for females) and that the risk for alcohol disorders and other health effects rises proportionally with the number of drinks per week and how often a drinker exceeds daily limits.  Discussed cessation/primary prevention of drug use and availability of treatment for abuse.   Diet: Encouraged to adjust caloric intake to maintain  or achieve ideal body weight, to reduce intake of dietary saturated fat and total fat, to limit sodium intake by avoiding high sodium foods and not adding table salt, and to maintain adequate dietary potassium and calcium preferably from fresh fruits, vegetables, and low-fat dairy products.    stressed the importance of regular exercise  Injury prevention: Discussed safety belts, safety helmets, smoke detector, smoking near bedding or upholstery.   Dental health: Discussed importance of regular tooth brushing, flossing, and dental visits.    NEXT PREVENTATIVE PHYSICAL DUE IN 1 YEAR. Return in about 4 weeks (around 02/24/2017) for BP check.

## 2017-01-28 LAB — COMPREHENSIVE METABOLIC PANEL
A/G RATIO: 1.2 (ref 1.2–2.2)
ALT: 19 IU/L (ref 0–32)
AST: 18 IU/L (ref 0–40)
Albumin: 4.2 g/dL (ref 3.5–5.5)
Alkaline Phosphatase: 60 IU/L (ref 39–117)
BUN/Creatinine Ratio: 18 (ref 9–23)
BUN: 15 mg/dL (ref 6–20)
Bilirubin Total: <0.2 mg/dL (ref 0.0–1.2)
CALCIUM: 9.8 mg/dL (ref 8.7–10.2)
CO2: 26 mmol/L (ref 20–29)
Chloride: 99 mmol/L (ref 96–106)
Creatinine, Ser: 0.82 mg/dL (ref 0.57–1.00)
GFR calc Af Amer: 108 mL/min/{1.73_m2} (ref >59)
GFR, EST NON AFRICAN AMERICAN: 94 mL/min/{1.73_m2} (ref >59)
Globulin, Total: 3.4 g/dL (ref 1.5–4.5)
Glucose: 91 mg/dL (ref 65–99)
POTASSIUM: 4.4 mmol/L (ref 3.5–5.2)
Sodium: 138 mmol/L (ref 134–144)
Total Protein: 7.6 g/dL (ref 6.0–8.5)

## 2017-01-28 LAB — LIPID PANEL
CHOL/HDL RATIO: 4.6 ratio — AB (ref 0.0–4.4)
CHOLESTEROL TOTAL: 174 mg/dL (ref 100–199)
HDL: 38 mg/dL — ABNORMAL LOW (ref >39)
LDL Calculated: 108 mg/dL — ABNORMAL HIGH (ref 0–99)
TRIGLYCERIDES: 140 mg/dL (ref 0–149)
VLDL Cholesterol Cal: 28 mg/dL (ref 5–40)

## 2017-01-28 LAB — CBC WITH DIFFERENTIAL/PLATELET
Basophils Absolute: 0 10*3/uL (ref 0.0–0.2)
Basos: 1 %
EOS (ABSOLUTE): 0.3 10*3/uL (ref 0.0–0.4)
Eos: 6 %
Hematocrit: 33.7 % — ABNORMAL LOW (ref 34.0–46.6)
Hemoglobin: 11 g/dL — ABNORMAL LOW (ref 11.1–15.9)
IMMATURE GRANULOCYTES: 0 %
Immature Grans (Abs): 0 10*3/uL (ref 0.0–0.1)
Lymphocytes Absolute: 1.9 10*3/uL (ref 0.7–3.1)
Lymphs: 35 %
MCH: 26.8 pg (ref 26.6–33.0)
MCHC: 32.6 g/dL (ref 31.5–35.7)
MCV: 82 fL (ref 79–97)
MONOS ABS: 0.3 10*3/uL (ref 0.1–0.9)
Monocytes: 6 %
NEUTROS PCT: 52 %
Neutrophils Absolute: 2.8 10*3/uL (ref 1.4–7.0)
PLATELETS: 437 10*3/uL — AB (ref 150–379)
RBC: 4.1 x10E6/uL (ref 3.77–5.28)
RDW: 17.5 % — AB (ref 12.3–15.4)
WBC: 5.5 10*3/uL (ref 3.4–10.8)

## 2017-01-28 LAB — TSH: TSH: 2.69 u[IU]/mL (ref 0.450–4.500)

## 2017-01-28 LAB — HIV ANTIBODY (ROUTINE TESTING W REFLEX): HIV Screen 4th Generation wRfx: NONREACTIVE

## 2017-01-29 ENCOUNTER — Telehealth: Payer: Self-pay | Admitting: Family Medicine

## 2017-01-29 NOTE — Telephone Encounter (Addendum)
Called patient. No answer. Will try later.  

## 2017-01-29 NOTE — Telephone Encounter (Signed)
Please call and let her know all of her labs came back normal other than a very mildly elevated cholesterol which can be improved with all the lifestyle modifications we discussed

## 2017-01-29 NOTE — Patient Instructions (Signed)
Follow-up for B/P check

## 2017-01-29 NOTE — Telephone Encounter (Signed)
Spoke with patient. Gave her the information per Rachel's note.  She said she is going to be working on the [employer] Rachel discussed at the visit regarding lifestyle changes to help with her mildly elevated cholesterol.  Just FYI  Thanks

## 2017-01-29 NOTE — Assessment & Plan Note (Signed)
Restart previous regimen, DASH diet, weight loss. Compression stockings and elevation for leg swelling

## 2017-03-01 ENCOUNTER — Ambulatory Visit: Payer: 59 | Admitting: Family Medicine

## 2017-03-12 ENCOUNTER — Ambulatory Visit: Payer: 59 | Admitting: Family Medicine

## 2017-03-17 ENCOUNTER — Ambulatory Visit: Payer: Self-pay | Admitting: Family Medicine

## 2017-03-23 ENCOUNTER — Other Ambulatory Visit: Payer: Self-pay | Admitting: Family Medicine

## 2017-03-23 NOTE — Telephone Encounter (Signed)
Copied from CRM #[id number]. Topic: General - Other >> Mar 23, 2017  2:34 PM Louie BunPalacios Medina, Rosey Batheresa D wrote: Patient needs medication refill but is having personal problems and want to know if they can be refill without her coming in to the office. Please call patient back, thanks.

## 2017-03-24 ENCOUNTER — Ambulatory Visit: Payer: Self-pay | Admitting: Family Medicine

## 2017-03-25 MED ORDER — AMLODIPINE BESYLATE 10 MG PO TABS: 10.0000 mg | ORAL_TABLET | Freq: Every day | ORAL | 1 refills | Status: DC

## 2017-03-25 MED ORDER — CHLORTHALIDONE 25 MG PO TABS: ORAL_TABLET | ORAL | 1 refills | Status: DC

## 2017-03-25 NOTE — Telephone Encounter (Signed)
Routing to provider.  90 day supply requested.  Moving to [place].

## 2017-03-25 NOTE — Telephone Encounter (Signed)
Amoodipine 10 ml  Besylate  Chlorchalidone 25 ml wants to see if can get 90 day supply. she is moving to [place] leaving Saturday 10/27 and needs to have medication until can establish a dr there.

## 2017-03-25 NOTE — Telephone Encounter (Signed)
Rxs sent

## 2017-03-26 NOTE — Telephone Encounter (Signed)
Pt is aware.  

## 2018-06-09 ENCOUNTER — Other Ambulatory Visit: Payer: Self-pay

## 2018-06-09 ENCOUNTER — Emergency Department
Admission: EM | Admit: 2018-06-09 | Discharge: 2018-06-09 | Disposition: A | Discharge status: Home / Self Care | Payer: Managed Care, Other (non HMO) | Attending: Emergency Medicine | Admitting: Emergency Medicine

## 2018-06-09 ENCOUNTER — Encounter: Payer: Self-pay | Admitting: *Deleted

## 2018-06-09 DIAGNOSIS — E669 Obesity, unspecified: Secondary | ICD-10-CM | POA: Insufficient documentation

## 2018-06-09 DIAGNOSIS — F17210 Nicotine dependence, cigarettes, uncomplicated: Secondary | ICD-10-CM | POA: Diagnosis not present

## 2018-06-09 DIAGNOSIS — I10 Essential (primary) hypertension: Secondary | ICD-10-CM

## 2018-06-09 DIAGNOSIS — M25551 Pain in right hip: Secondary | ICD-10-CM | POA: Diagnosis present

## 2018-06-09 DIAGNOSIS — M5431 Sciatica, right side: Secondary | ICD-10-CM | POA: Diagnosis not present

## 2018-06-09 MED ORDER — METHYLPREDNISOLONE 4 MG PO TABS: ORAL_TABLET | ORAL | 0 refills | Status: DC

## 2018-06-09 MED ORDER — AMLODIPINE BESYLATE 10 MG PO TABS: 10.0000 mg | ORAL_TABLET | Freq: Every day | ORAL | 0 refills | Status: DC

## 2018-06-09 NOTE — ED Triage Notes (Addendum)
PT to ED reporting right hip pain since last week. Pt remembers last week she felt a sudden sharp pain in his right hip after moving her right leg. Pt reports the pain in her hip has traveled down into her right calf, shin and foot. Foot is not numb but pt reports it has been feeling "weird." No swelling noted. Pt verbalized concern of blood clot. No smoking, No birth control, no long trips or extended periods of sitting.   Pt has chronic HTN and has not been taking medications since September. Pt reports insurance recently kicked in and she will now be able to  Fill BP medications but does not have an appointment yet. Pt was taking amlodipine and is requesting if the doctor could give her some pills here until she can follow up.

## 2018-06-09 NOTE — ED Provider Notes (Signed)
[facility] Emergency Department Provider Note  ____________________________________________  Time seen: Approximately 4:12 PM  I have reviewed the triage vital signs and the nursing notes.   HISTORY  Chief Complaint Hip Pain    HPI Nancy Garza is a 38 y.o. female with a history of HTN not on medications, obesity, presenting for right hip pain.  The patient reports that last week, she was crossing her right leg over the left leg when she had an acute sharp pain in the right hip.  Since then, she has continued to have pain in the hip, which radiates down the buttock, thigh, with some associated numbness on the medial aspect of the lower tibia.  She has not had any saddle anesthesia, urinary or fecal incontinence or retention, or difficulty walking.  He denies any low back pain.  In addition, the patient is found to be markedly hypertensive with a blood pressure of 177/95 today; she states that she has not taken any of her blood pressure medications due to insurance issues but is planning to make an appoint with her primary care physician next week.  She denies any associated symptoms.  Past Medical History:  Diagnosis Date  . Abnormal Pap smear of cervix    LEEP procedure  . Hypertension     Patient Active Problem List   Diagnosis Date Noted  . Preventative health care 01/25/2015  . Morbid obesity (HCC) 12/16/2014  . Benign hypertension 12/16/2014    History reviewed. No pertinent surgical history.  Current Outpatient Rx  . Order #: [id number] Class: Print  . Order #: [id number] Class: Normal  . Order #: [id number] Class: Print    Allergies Patient has no known allergies.  Family History  Problem Relation Age of Onset  . Diabetes Mother   . Hypertension Mother   . Stroke Mother   . COPD Mother   . Hypertension Father   . Diabetes Maternal Grandmother   . Hypertension Maternal Grandmother   . Stroke Maternal Grandmother   . Diabetes Paternal  Grandmother   . Hypertension Paternal Grandmother   . Stroke Paternal Grandmother     Social History Social History   Tobacco Use  . Smoking status: Current Some Day Smoker  . Smokeless tobacco: Never Used  Substance Use Topics  . Alcohol use: No    Comment: on the weekends  . Drug use: Yes    Types: Marijuana    Comment: history of marijuana use    Review of Systems Constitutional: No fever/chills.  Lightheadedness or syncope.  No trauma. Eyes: No visual changes. ENT: No sore throat. No congestion or rhinorrhea. Cardiovascular: Denies chest pain. Denies palpitations.  Positive hypertension. Respiratory: Denies shortness of breath.  No cough. Gastrointestinal: No abdominal pain.  No nausea, no vomiting.  No diarrhea.  No constipation. Genitourinary: Negative for dysuria. Musculoskeletal: Negative for back pain.  Acute episode of right hip pain, gluteal pain and posterior thigh pain. Skin: Negative for rash. Neurological: Negative for headaches. No focal , tingling or weakness.  Numbness on the medial aspect of the  the distal tibia, mild.    ____________________________________________   PHYSICAL EXAM:  VITAL SIGNS: ED Triage Vitals  Enc Vitals Group     BP 06/09/18 1208 (!) 177/95     Pulse Rate 06/09/18 1206 (!) 103     Resp --      Temp 06/09/18 1206 98.6 F (37 C)     Temp Source 06/09/18 1206 Oral     SpO2 06/09/18  1206 99 %     Weight 06/09/18 1206 (!) 334 lb (151.5 kg)     Height 06/09/18 1206 5\' 7"  (1.702 m)     Head Circumference --      Peak Flow --      Pain Score 06/09/18 1212 5     Pain Loc --      Pain Edu? --      Excl. in GC? --     Constitutional: Alert and oriented. Answers questions appropriately. Eyes: Conjunctivae are normal.  EOMI. No scleral icterus. Head: Atraumatic. Nose: No congestion/rhinnorhea. Mouth/Throat: Mucous membranes are moist.  Neck: No stridor.  Supple.  No JVD.  No meningismus. Cardiovascular: Normal rate, regular  rhythm. No murmurs, rubs or gallops.  Respiratory: Normal respiratory effort.  No accessory muscle use or retractions. Lungs CTAB.  No wheezes, rales or ronchi. Gastrointestinal: Orbitally obese.  Soft, nontender and nondistended.  No guarding or rebound.  No peritoneal signs. Musculoskeletal: No midline C, T or L-spine tenderness to palpation, step-offs or deformities.  The patient does have reproducible tenderness over the right buttock.  She has full range of motion of the right hip, knee and ankle without significant pain.  She has normal DP and PT pulses in the right leg.  SHe does have isolated decrease sensation to light touch on the medial aspect of the distal tibia around the sock area without any overlying skin changes.  No LE edema. No ttp in the calves or palpable cords.  Negative Homan's sign.  Gait without ataxia or difficulty walking. Neurologic:  A&Ox3.  Speech is clear.  Face and smile are symmetric.  EOMI.  Moves all extremities well. Skin:  Skin is warm, dry and intact. No rash noted. Psychiatric: Mood and affect are normal. Speech and behavior are normal.  Normal judgement.  ____________________________________________   LABS (all labs ordered are listed, but only abnormal results are displayed)  Labs Reviewed - No data to display ____________________________________________  EKG ED ECG REPORT I, Anne-Caroline Sharma CovertNorman, the attending physician, personally viewed and interpreted this ECG.   Date: 06/09/2018  EKG Time: 1610  Rate: 94  Rhythm: normal sinus rhythm  Axis: leftward  Intervals:none  ST&T Change: No STEMI  ____________________________________________  RADIOLOGY  No results found.  ____________________________________________   PROCEDURES  Procedure(s) performed: None  Procedures  Critical Care performed: No ____________________________________________   INITIAL IMPRESSION / ASSESSMENT AND PLAN / ED COURSE  Pertinent labs & imaging results  that were available during my care of the patient were reviewed by me and considered in my medical decision making (see chart for details).  38 y.o. female with morbid obesity presenting with an acute onset of right hip pain after crossing her legs.  Overall, the patient symptoms are most concerning for nerve impingement or inflammation, even possibly sciatica although the patient does not have any low back pain.  I do not suspect spinal cord compression or cauda equina.  The patient does have hypertension, so aortic pathology is considered but very unlikely.  I will plan to treat the patient with a Medrol Dosepak, cryotherapy and heat therapy as needed, and continued Motrin and Tylenol for her pain.  She will follow-up with her primary care physician next week for reevaluation.  The patient is concerned she might have a DVT, and I am not seeing any evidence of this.  I have encouraged her to have a follow-up ultrasound if her symptoms are not improving by next week.  The patient will  be given a month supply of amlodipine here, but I have given her instructions about recording her daily blood pressures and bring in the record with her to her primary care physician's office.  I have also warned her that the prednisone and the Medrol Dosepak may elevate her blood sugars, although she states she has never been diagnosed with diabetes in the past.  This time, the patient is safe for discharge home.  Return precautions as well as follow-up instructions were discussed peer  ____________________________________________  FINAL CLINICAL IMPRESSION(S) / ED DIAGNOSES  Final diagnoses:  Essential hypertension  Sciatica of right side         NEW MEDICATIONS STARTED DURING THIS VISIT:  New Prescriptions   METHYLPREDNISOLONE (MEDROL) 4 MG TABLET    Day 1: 8 mg PO before breakfast, 4 mg after lunch and after dinner, and 8 mg at bedtime Day 2: 4 mg PO before breakfast, after lunch, and after dinner and 8 mg at  bedtime Day 3: 4 mg PO before breakfast, after lunch, after dinner, and at bedtime Day 4: 4 mg PO before breakfast, after lunch, and at bedtime Day 5: 4 mg PO before breakfast and at bedtime Day 6: 4 mg PO before breakfast      Rockne Menghini, MD 06/09/18 1617

## 2018-06-09 NOTE — Discharge Instructions (Addendum)
Restart your amlodipine, and monitor your daily blood pressures and heart rate.  Please bring the record of your daily readings to your primary care physician's appointment next week.  Please take the steroid medication to decrease inflammation and help your hip and leg pain.  Please continue Motrin and Tylenol as needed for your pain.  In addition, you may use a heating pad or ice on your hip or low back for 15 minutes every 2 hours to decrease pain.  Continue to stay active, but do not "overdo it" to prevent pain.  Return to the emergency department if you develop severe pain, lightheadedness or fainting, changes in your bowel bladder function, numbness tingling or weakness, or any other symptoms concerning to you.

## 2018-06-09 NOTE — ED Notes (Signed)
Pt alert and oriented X4, active, cooperative, pt in NAD. RR even and unlabored, color WNL.  Pt informed to return if any life threatening symptoms occur.  Discharge and followup instructions reviewed. Ambulates safely. 

## 2018-09-05 ENCOUNTER — Telehealth: Payer: Self-pay | Admitting: Family Medicine

## 2018-09-05 NOTE — Telephone Encounter (Signed)
That works thank you  Copied from [employer] [id number]. Topic: Appointment Scheduling - Scheduling Inquiry for Clinic >> Sep 05, 2018  9:07 AM Nancy Garza wrote: Reason for CRM: Nancy Garza called and stated that she would like a call back about scheduling. Nancy Garza was seen in 2018 but no pcp in chart. Please advise. >> Sep 05, 2018  3:56 PM Nancy Garza wrote: Nancy Garza with Nancy Garza and she stated that she is out of BP meds and she also had intercourse and it was painful. I have scheduled Nancy Garza for in office visit Thursday is this ok?

## 2018-09-08 ENCOUNTER — Ambulatory Visit: Payer: Managed Care, Other (non HMO) | Admitting: Family Medicine

## 2018-09-08 ENCOUNTER — Other Ambulatory Visit (HOSPITAL_COMMUNITY)
Admission: RE | Admit: 2018-09-08 | Discharge: 2018-09-08 | Disposition: A | Discharge status: Home / Self Care | Payer: Managed Care, Other (non HMO) | Source: Ambulatory Visit | Attending: Family Medicine | Admitting: Family Medicine

## 2018-09-08 ENCOUNTER — Other Ambulatory Visit: Payer: Self-pay

## 2018-09-08 ENCOUNTER — Encounter: Payer: Self-pay | Admitting: Family Medicine

## 2018-09-08 VITALS — BP 170/133 | HR 91 | Temp 98.4°F | Ht 67.0 in | Wt 338.0 lb

## 2018-09-08 DIAGNOSIS — N941 Unspecified dyspareunia: Secondary | ICD-10-CM | POA: Insufficient documentation

## 2018-09-08 DIAGNOSIS — I10 Essential (primary) hypertension: Secondary | ICD-10-CM

## 2018-09-08 DIAGNOSIS — N760 Acute vaginitis: Secondary | ICD-10-CM | POA: Diagnosis not present

## 2018-09-08 DIAGNOSIS — B9689 Other specified bacterial agents as the cause of diseases classified elsewhere: Secondary | ICD-10-CM

## 2018-09-08 DIAGNOSIS — R102 Pelvic and perineal pain: Secondary | ICD-10-CM | POA: Diagnosis not present

## 2018-09-08 LAB — UA/M W/RFLX CULTURE, ROUTINE
Bilirubin, UA: NEGATIVE
Glucose, UA: NEGATIVE
Ketones, UA: NEGATIVE
Leukocytes,UA: NEGATIVE
Nitrite, UA: NEGATIVE
RBC, UA: NEGATIVE
Specific Gravity, UA: 1.02 (ref 1.005–1.030)
Urobilinogen, Ur: 0.2 mg/dL (ref 0.2–1.0)
pH, UA: 6 (ref 5.0–7.5)

## 2018-09-08 LAB — MICROSCOPIC EXAMINATION: RBC: NONE SEEN /hpf (ref 0–2)

## 2018-09-08 LAB — WET PREP FOR TRICH, YEAST, CLUE
Clue Cell Exam: POSITIVE — AB
Trichomonas Exam: NEGATIVE
Yeast Exam: NEGATIVE

## 2018-09-08 MED ORDER — AMLODIPINE BESYLATE 10 MG PO TABS: 10.0000 mg | ORAL_TABLET | Freq: Every day | ORAL | 0 refills | Status: DC

## 2018-09-08 MED ORDER — CLINDAMYCIN HCL 150 MG PO CAPS: 150.0000 mg | ORAL_CAPSULE | Freq: Two times a day (BID) | ORAL | 0 refills | Status: DC

## 2018-09-08 MED ORDER — CHLORTHALIDONE 25 MG PO TABS: ORAL_TABLET | ORAL | 0 refills | Status: DC

## 2018-09-08 NOTE — Patient Instructions (Signed)
Probiotics - get one with "female health" or mention of lactobacillus if you can  Boric acid supplements (vaginal suppositories) - can look online for these

## 2018-09-08 NOTE — Progress Notes (Signed)
BP (!) 170/133   Pulse 91   Temp 98.4 F (36.9 C) (Oral)   Ht 5\' 7"  (1.702 m)   Wt (!) 338 lb (153.3 kg)   SpO2 100%   BMI 52.94 kg/m    Subjective:    Patient ID: [name], female    DOB: 15-Jan-1981, 38 y.o.   MRN: [id number]  HPI: Nancy Garza is a 38 y.o. female  Chief Complaint  Patient presents with  . Hypertension    amlodipine, hygroton refill  . Vaginal Pain    pt states has had paind and felt a bump on her vagina left side. ongoing x 4 days. pt states that the bump seems like is draning   Here today for HTN f/u and vaginal issues.   Has been off of her BP medications for about a month or so. Was previously on amlodipine and chlorthalidone. Notes it was still elevated even while on it. Does have some headaches, but no CP, SOB, palpitations, dizziness.   Had a painful episode of intercourse last week where she felt swollen afterward and now having dysuria. Felt like she was holding something in her vagina. Several days later she noted a lot of white discharge that resembled pus. No odor, lesions, bleeding, fevers, pelvic pain, abdominal pain, N/V/D. Not trying anything OTC for sxs. No known exposures to STIs.   Relevant past medical, surgical, family and social history reviewed and updated as indicated. Interim medical history since our last visit reviewed. Allergies and medications reviewed and updated.  Review of Systems  Per HPI unless specifically indicated above     Objective:    BP (!) 170/133   Pulse 91   Temp 98.4 F (36.9 C) (Oral)   Ht 5\' 7"  (1.702 m)   Wt (!) 338 lb (153.3 kg)   SpO2 100%   BMI 52.94 kg/m   Wt Readings from Last 3 Encounters:  09/08/18 (!) 338 lb (153.3 kg)  06/09/18 (!) 334 lb (151.5 kg)  01/27/17 (!) 359 lb (162.8 kg)    Physical Exam Vitals signs and nursing note reviewed.  Constitutional:      Appearance: Normal appearance. She is not ill-appearing.  HENT:     Head: Atraumatic.  Eyes:   Extraocular Movements: Extraocular movements intact.     Conjunctiva/sclera: Conjunctivae normal.  Neck:     Musculoskeletal: Normal range of motion and neck supple.  Cardiovascular:     Rate and Rhythm: Normal rate and regular rhythm.     Heart sounds: Normal heart sounds.  Pulmonary:     Effort: Pulmonary effort is normal.     Breath sounds: Normal breath sounds.  Abdominal:     General: Bowel sounds are normal.     Palpations: Abdomen is soft.     Tenderness: There is no abdominal tenderness. There is no right CVA tenderness or left CVA tenderness.  Genitourinary:    General: Normal vulva.     Vagina: Vaginal discharge present.  Musculoskeletal: Normal range of motion.  Skin:    General: Skin is warm and dry.  Neurological:     Mental Status: She is alert and oriented to person, place, and time.  Psychiatric:        Mood and Affect: Mood normal.        Thought Content: Thought content normal.        Judgment: Judgment normal.     Results for orders placed or performed in visit on 09/08/18  WET  PREP FOR TRICH, YEAST, CLUE  Result Value Ref Range   Trichomonas Exam Negative Negative   Yeast Exam Negative Negative   Clue Cell Exam Positive (A) Negative  GC/Chlamydia Probe Amp  Result Value Ref Range   Chlamydia trachomatis, NAA Negative Negative   Neisseria Gonorrhoeae by PCR Negative Negative  Microscopic Examination  Result Value Ref Range   WBC, UA 0-5 0 - 5 /hpf   RBC None seen 0 - 2 /hpf   Epithelial Cells (non renal) 0-10 0 - 10 /hpf   Bacteria, UA Few (A) None seen/Few  UA/M w/rflx Culture, Routine  Result Value Ref Range   Specific Gravity, UA 1.020 1.005 - 1.030   pH, UA 6.0 5.0 - 7.5   Color, UA Yellow Yellow   Appearance Ur Clear Clear   Leukocytes,UA Negative Negative   Protein,UA 1+ (A) Negative/Trace   Glucose, UA Negative Negative   Ketones, UA Negative Negative   RBC, UA Negative Negative   Bilirubin, UA Negative Negative   Urobilinogen, Ur 0.2  0.2 - 1.0 mg/dL   Nitrite, UA Negative Negative   Microscopic Examination See below:       Assessment & Plan:   Problem List Items Addressed This Visit      Cardiovascular and Mediastinum   Benign hypertension - Primary    Restart medications, f/u in 2 weeks for recheck and adjustments. DASH diet, exercise reviewed. Log home readings as able      Relevant Medications   chlorthalidone (HYGROTON) 25 MG tablet   amLODipine (NORVASC) 10 MG tablet    Other Visit Diagnoses    BV (bacterial vaginosis)       Tx with clindamycin, probiotics, and good vaginal hygiene. F/u if not improving   Relevant Medications   clindamycin (CLEOCIN) 150 MG capsule   Other Relevant Orders   WET PREP FOR TRICH, YEAST, CLUE (Completed)   GC/Chlamydia Probe Amp (Completed)   Vaginal pain       Relevant Orders   UA/M w/rflx Culture, Routine (Completed)   Dyspareunia in female       Await gc chlamydia testing, pap smear done   Relevant Orders   Cytology - PAP       Follow up plan: Return in about 2 weeks (around 09/22/2018) for Virtual HTN f/u.

## 2018-09-10 LAB — GC/CHLAMYDIA PROBE AMP
Chlamydia trachomatis, NAA: NEGATIVE
Neisseria Gonorrhoeae by PCR: NEGATIVE

## 2018-09-12 ENCOUNTER — Telehealth: Payer: Self-pay | Admitting: General Practice

## 2018-09-12 NOTE — Telephone Encounter (Signed)
Reviewed lab results and physician's note with patient. She will phone at a later date for the pap results.

## 2018-09-12 NOTE — Assessment & Plan Note (Signed)
Restart medications, f/u in 2 weeks for recheck and adjustments. DASH diet, exercise reviewed. Log home readings as able

## 2018-09-13 LAB — CYTOLOGY - PAP
Adequacy: ABSENT
Diagnosis: NEGATIVE
HPV: NOT DETECTED

## 2018-09-22 ENCOUNTER — Ambulatory Visit: Payer: Managed Care, Other (non HMO) | Admitting: Family Medicine

## 2018-10-06 ENCOUNTER — Other Ambulatory Visit: Payer: Self-pay | Admitting: Family Medicine

## 2018-10-06 ENCOUNTER — Ambulatory Visit (INDEPENDENT_AMBULATORY_CARE_PROVIDER_SITE_OTHER): Payer: Managed Care, Other (non HMO) | Admitting: Family Medicine

## 2018-10-06 ENCOUNTER — Encounter: Payer: Self-pay | Admitting: Family Medicine

## 2018-10-06 ENCOUNTER — Other Ambulatory Visit: Payer: Self-pay

## 2018-10-06 VITALS — BP 138/90 | HR 87

## 2018-10-06 DIAGNOSIS — I10 Essential (primary) hypertension: Secondary | ICD-10-CM

## 2018-10-06 MED ORDER — METOPROLOL SUCCINATE ER 25 MG PO TB24: 25.0000 mg | ORAL_TABLET | Freq: Every day | ORAL | 0 refills | Status: DC

## 2018-10-06 NOTE — Assessment & Plan Note (Signed)
Add metoprolol, continue current regimen. Work hard on [employer], exercise, weight loss. Continue monitoring at home and call with persistent abnormal readings. F/u for recheck in 2 weeks

## 2018-10-06 NOTE — Progress Notes (Signed)
BP 138/90   Pulse 87   LMP 09/23/2018 (Exact Date)    Subjective:    Patient ID: Nancy Garza, female    DOB: 1980-06-10, 38 y.o.   MRN: [id number]  HPI: Nancy ShirtsMunirah K Lad is a 38 y.o. female  Chief Complaint  Patient presents with  . Hypertension    . This visit was completed via WebEx due to the restrictions of the COVID-19 pandemic. All issues as above were discussed and addressed. Physical exam was done as above through visual confirmation on WebEx. If it was felt that the patient should be evaluated in the office, they were directed there. The patient verbally consented to this visit. . Location of the patient: home . Location of the provider: work . Those involved with this call:  . Provider: Roosvelt Maserachel , PA-C . CMA: Wilhemena DurieBrittany Russell, CMA . Front Desk/Registration: Harriet PhoJoliza Johnson  . Time spent on call: 15 minutes with patient face to face via video conference. More than 50% of this time was spent in counseling and coordination of care. 5 minutes total spent in review of patient's record and preparation of their chart. I verified patient identity using two factors (patient name and date of birth). Patient consents verbally to being seen via telemedicine visit today.   Here today for 2 week BP f/u after restarting previous BP regimen of amlodipine and hygroton. Tolerating medicines well and taking faithfully. Home BPs have been 150s-160s/90s-106 with heart rates in the 90s, today is first normal reading she's had. Denies CP, SOB, HAs, dizziness. States she has not changed her diet and has not been active lately due to quarantine for COVID. Just went and bought healthy groceries and plans to make some major changes.   Relevant past medical, surgical, family and social history reviewed and updated as indicated. Interim medical history since our last visit reviewed. Allergies and medications reviewed and updated.  Review of Systems  Per HPI unless specifically indicated  above     Objective:    BP 138/90   Pulse 87   LMP 09/23/2018 (Exact Date)   Wt Readings from Last 3 Encounters:  09/08/18 (!) 338 lb (153.3 kg)  06/09/18 (!) 334 lb (151.5 kg)  01/27/17 (!) 359 lb (162.8 kg)    Physical Exam Vitals signs and nursing note reviewed.  Constitutional:      General: She is not in acute distress.    Appearance: Normal appearance.  HENT:     Head: Atraumatic.     Right Ear: External ear normal.     Left Ear: External ear normal.     Nose: Nose normal. No congestion.     Mouth/Throat:     Mouth: Mucous membranes are moist.     Pharynx: Oropharynx is clear. No posterior oropharyngeal erythema.  Eyes:     Extraocular Movements: Extraocular movements intact.     Conjunctiva/sclera: Conjunctivae normal.  Neck:     Musculoskeletal: Normal range of motion.  Cardiovascular:     Comments: Unable to assess via virtual visit Pulmonary:     Effort: Pulmonary effort is normal. No respiratory distress.  Musculoskeletal: Normal range of motion.  Skin:    General: Skin is dry.     Findings: No erythema.  Neurological:     Mental Status: She is alert and oriented to person, place, and time.  Psychiatric:        Mood and Affect: Mood normal.        Thought Content: Thought content normal.  Judgment: Judgment normal.     Results for orders placed or performed in visit on 09/08/18  WET PREP FOR TRICH, YEAST, CLUE  Result Value Ref Range   Trichomonas Exam Negative Negative   Yeast Exam Negative Negative   Clue Cell Exam Positive (A) Negative  GC/Chlamydia Probe Amp  Result Value Ref Range   Chlamydia trachomatis, NAA Negative Negative   Neisseria Gonorrhoeae by PCR Negative Negative  Microscopic Examination  Result Value Ref Range   WBC, UA 0-5 0 - 5 /hpf   RBC None seen 0 - 2 /hpf   Epithelial Cells (non renal) 0-10 0 - 10 /hpf   Bacteria, UA Few (A) None seen/Few  UA/M w/rflx Culture, Routine  Result Value Ref Range   Specific Gravity,  UA 1.020 1.005 - 1.030   pH, UA 6.0 5.0 - 7.5   Color, UA Yellow Yellow   Appearance Ur Clear Clear   Leukocytes,UA Negative Negative   Protein,UA 1+ (A) Negative/Trace   Glucose, UA Negative Negative   Ketones, UA Negative Negative   RBC, UA Negative Negative   Bilirubin, UA Negative Negative   Urobilinogen, Ur 0.2 0.2 - 1.0 mg/dL   Nitrite, UA Negative Negative   Microscopic Examination See below:   Cytology - PAP  Result Value Ref Range   Adequacy      Satisfactory for evaluation  endocervical/transformation zone component ABSENT.   Diagnosis      NEGATIVE FOR INTRAEPITHELIAL LESIONS OR MALIGNANCY.   Diagnosis SHIFT IN [place] SUGGESTIVE OF BACTERIAL VAGINOSIS.    HPV NOT DETECTED    Material Submitted CervicoVaginal Pap [ThinPrep Imaged]    CYTOLOGY - PAP PAP RESULT       Assessment & Plan:   Problem List Items Addressed This Visit      Cardiovascular and Mediastinum   Benign hypertension - Primary    Add metoprolol, continue current regimen. Work hard on [employer], exercise, weight loss. Continue monitoring at home and call with persistent abnormal readings. F/u for recheck in 2 weeks      Relevant Medications   metoprolol succinate (TOPROL-XL) 25 MG 24 hr tablet     Other   Morbid obesity (HCC)    Discussed good diet and exercise changes. Pt devoted to making these changes. Continue to monitor          Follow up plan: Return in about 2 weeks (around 10/20/2018) for BP.

## 2018-10-06 NOTE — Assessment & Plan Note (Signed)
Discussed good diet and exercise changes. Pt devoted to making these changes. Continue to monitor

## 2018-10-12 ENCOUNTER — Telehealth: Payer: Self-pay | Admitting: Family Medicine

## 2018-10-12 MED ORDER — AMLODIPINE BESYLATE 10 MG PO TABS: ORAL_TABLET | ORAL | 0 refills | Status: DC

## 2018-10-12 NOTE — Telephone Encounter (Signed)
Rx sent for 90 day supply  Copied from CRM [id number]. Topic: General - Other >> Oct 12, 2018  9:58 AM Herby Abraham C wrote: Reason for CRM: pt received a  day supply of her medication amLODipine (NORVASC) 10 MG tablet . Pt says that her pharmacy will not fill for her because her insurance will only cover a 90 day supply. Pt says that she is completely out of her medication. Pt would like to know if a 90 day supply could be sent in to pharmacy as soon as possible.

## 2018-10-12 NOTE — Telephone Encounter (Signed)
Called pt to let her know that prescriptions were called in, no answer, left voicemail.

## 2018-10-20 ENCOUNTER — Encounter: Payer: Self-pay | Admitting: Family Medicine

## 2018-10-20 ENCOUNTER — Ambulatory Visit (INDEPENDENT_AMBULATORY_CARE_PROVIDER_SITE_OTHER): Payer: Managed Care, Other (non HMO) | Admitting: Family Medicine

## 2018-10-20 ENCOUNTER — Other Ambulatory Visit: Payer: Self-pay

## 2018-10-20 VITALS — BP 146/91 | HR 92

## 2018-10-20 DIAGNOSIS — I10 Essential (primary) hypertension: Secondary | ICD-10-CM | POA: Diagnosis not present

## 2018-10-20 MED ORDER — METOPROLOL SUCCINATE ER 50 MG PO TB24: 50.0000 mg | ORAL_TABLET | Freq: Every day | ORAL | 0 refills | Status: DC

## 2018-10-20 NOTE — Progress Notes (Signed)
BP (!) 146/91 Comment: pt reported- virtual visit  Pulse 92   LMP 09/23/2018 (Exact Date)    Subjective:    Patient ID: Nancy Garza, female    DOB: 1980/11/14, 38 y.o.   MRN: [id number]  HPI: Nancy Garza is a 38 y.o. female  Chief Complaint  Patient presents with  . Hypertension    2 week f/up    . This visit was completed via telephone due to the restrictions of the COVID-19 pandemic. All issues as above were discussed and addressed but no physical exam was performed. If it was felt that the patient should be evaluated in the office, they were directed there. The patient verbally consented to this visit. Patient was unable to complete an audio/visual visit due to Technical difficulties,Lack of internet. Due to the catastrophic nature of the COVID-19 pandemic, this visit was done through audio contact only. . Location of the patient: home . Location of the provider: work . Those involved with this call:  . Provider: Roosvelt Maser, PA-C . CMA: Wilhemena Durie, CMA . Front Desk/Registration: Harriet Pho  . Time spent on call: 15 minutes on the phone discussing health concerns. 5 minutes total spent in review of patient's record and preparation of their chart. I verified patient identity using two factors (patient name and date of birth). Patient consents verbally to being seen via telemedicine visit today.   Here today for 2 week BP f/u after adding 25 mg metoprolol XL. Significant improvement on the medicine per patient, readings around 140/90 with HR of 75. Working on lifestyle changes with diet and exercise. Feeling well, no CP, SOB, dizziness, HAs, side effects noted.   Relevant past medical, surgical, family and social history reviewed and updated as indicated. Interim medical history since our last visit reviewed. Allergies and medications reviewed and updated.  Review of Systems  Per HPI unless specifically indicated above     Objective:    BP (!) 146/91  Comment: pt reported- virtual visit  Pulse 92   LMP 09/23/2018 (Exact Date)   Wt Readings from Last 3 Encounters:  09/08/18 (!) 338 lb (153.3 kg)  06/09/18 (!) 334 lb (151.5 kg)  01/27/17 (!) 359 lb (162.8 kg)    Physical Exam  Unable to perform PE due to technical difficulties with video technology  Results for orders placed or performed in visit on 09/08/18  WET PREP FOR TRICH, YEAST, CLUE  Result Value Ref Range   Trichomonas Exam Negative Negative   Yeast Exam Negative Negative   Clue Cell Exam Positive (A) Negative  GC/Chlamydia Probe Amp  Result Value Ref Range   Chlamydia trachomatis, NAA Negative Negative   Neisseria Gonorrhoeae by PCR Negative Negative  Microscopic Examination  Result Value Ref Range   WBC, UA 0-5 0 - 5 /hpf   RBC None seen 0 - 2 /hpf   Epithelial Cells (non renal) 0-10 0 - 10 /hpf   Bacteria, UA Few (A) None seen/Few  UA/M w/rflx Culture, Routine  Result Value Ref Range   Specific Gravity, UA 1.020 1.005 - 1.030   pH, UA 6.0 5.0 - 7.5   Color, UA Yellow Yellow   Appearance Ur Clear Clear   Leukocytes,UA Negative Negative   Protein,UA 1+ (A) Negative/Trace   Glucose, UA Negative Negative   Ketones, UA Negative Negative   RBC, UA Negative Negative   Bilirubin, UA Negative Negative   Urobilinogen, Ur 0.2 0.2 - 1.0 mg/dL   Nitrite, UA Negative Negative  Microscopic Examination See below:   Cytology - PAP  Result Value Ref Range   Adequacy      Satisfactory for evaluation  endocervical/transformation zone component ABSENT.   Diagnosis      NEGATIVE FOR INTRAEPITHELIAL LESIONS OR MALIGNANCY.   Diagnosis SHIFT IN [place] SUGGESTIVE OF BACTERIAL VAGINOSIS.    HPV NOT DETECTED    Material Submitted CervicoVaginal Pap [ThinPrep Imaged]    CYTOLOGY - PAP PAP RESULT       Assessment & Plan:   Problem List Items Addressed This Visit      Cardiovascular and Mediastinum   Benign hypertension - Primary    Improved but not quite to goal, will  increase metoprolol to 50 mg XL and continue good diet and exercise changes. Monitor home readings and f/u in 1 month for recheck      Relevant Medications   metoprolol succinate (TOPROL-XL) 50 MG 24 hr tablet       Follow up plan: Return in about 4 weeks (around 11/17/2018) for HTN.

## 2018-10-20 NOTE — Assessment & Plan Note (Signed)
Improved but not quite to goal, will increase metoprolol to 50 mg XL and continue good diet and exercise changes. Monitor home readings and f/u in 1 month for recheck

## 2018-11-15 ENCOUNTER — Other Ambulatory Visit: Payer: Self-pay | Admitting: Family Medicine

## 2018-11-16 ENCOUNTER — Other Ambulatory Visit: Payer: Self-pay

## 2018-11-16 MED ORDER — CHLORTHALIDONE 25 MG PO TABS: 25.0000 mg | ORAL_TABLET | Freq: Every day | ORAL | 0 refills | Status: DC

## 2018-11-16 NOTE — Telephone Encounter (Signed)
Pharmacy sent a fax stating that the patient's insurance requires a 90 day supply. Please advise.

## 2018-11-16 NOTE — Telephone Encounter (Signed)
90 day supply sent  Copied from [place] [id number]. Topic: General - Other >> Nov 16, 2018 10:54 AM Carolyn Stare wrote: Pt insurance company said the below med has to be a 90 day supply   chlorthalidone (HYGROTON) 25 MG tablet  Pharmacy  [facility]

## 2018-11-24 ENCOUNTER — Ambulatory Visit: Payer: Self-pay | Admitting: Family Medicine

## 2018-12-06 ENCOUNTER — Other Ambulatory Visit: Payer: Self-pay | Admitting: Family Medicine

## 2018-12-09 ENCOUNTER — Other Ambulatory Visit: Payer: Self-pay

## 2018-12-09 ENCOUNTER — Encounter: Payer: Self-pay | Admitting: Family Medicine

## 2018-12-09 ENCOUNTER — Other Ambulatory Visit: Payer: Self-pay | Admitting: Family Medicine

## 2018-12-09 ENCOUNTER — Ambulatory Visit (INDEPENDENT_AMBULATORY_CARE_PROVIDER_SITE_OTHER): Payer: Managed Care, Other (non HMO) | Admitting: Family Medicine

## 2018-12-09 VITALS — BP 131/82 | HR 76 | Temp 98.7°F | Ht 67.0 in | Wt 344.0 lb

## 2018-12-09 DIAGNOSIS — I10 Essential (primary) hypertension: Secondary | ICD-10-CM | POA: Diagnosis not present

## 2018-12-09 DIAGNOSIS — M7989 Other specified soft tissue disorders: Secondary | ICD-10-CM

## 2018-12-09 MED ORDER — METOPROLOL SUCCINATE ER 100 MG PO TB24: 100.0000 mg | ORAL_TABLET | Freq: Every day | ORAL | 1 refills | Status: DC

## 2018-12-09 MED ORDER — AMLODIPINE BESYLATE 5 MG PO TABS: 5.0000 mg | ORAL_TABLET | Freq: Every day | ORAL | 1 refills | Status: DC

## 2018-12-09 NOTE — Assessment & Plan Note (Signed)
Decrease amlodipine to 5 mg and increase metoprolol to 100 mg XL dose. Continue chlorthalidone 25 mg. Monitor home readings closely and continue excellent lifestyle habit modifications/weight loss. F/u in 1 month for recheck and bmp

## 2018-12-09 NOTE — Assessment & Plan Note (Signed)
Continue walking program that she's recently started, eating better. Down 10 lb, congratulated success

## 2018-12-09 NOTE — Progress Notes (Signed)
BP 131/82 Comment: left lower arm  Pulse 76   Temp 98.7 F (37.1 C) (Oral)   Ht 5\' 7"  (1.702 m)   Wt (!) 344 lb (156 kg)   BMI 53.88 kg/m    Subjective:    Patient ID: Nancy Garza, female    DOB: 05-06-1981, 38 y.o.   MRN: [id number]  HPI: Nancy Garza is a 38 y.o. female  Chief Complaint  Patient presents with  . Hypertension    f/u   Here today for 1 month HTN f/u. Home BP readings have been around 130s/80s on average. Does note that her ankles have been swelling since increasing the amlodipine to 10 mg. Otherwise, tolerating medicines well. Almost a month into eating well, and just started working out this week. Has lost about 10 lb already. Denies CP, SOB, dizziness, HAs.   Relevant past medical, surgical, family and social history reviewed and updated as indicated. Interim medical history since our last visit reviewed. Allergies and medications reviewed and updated.  Review of Systems  Per HPI unless specifically indicated above     Objective:    BP 131/82 Comment: left lower arm  Pulse 76   Temp 98.7 F (37.1 C) (Oral)   Ht 5\' 7"  (1.702 m)   Wt (!) 344 lb (156 kg)   BMI 53.88 kg/m   Wt Readings from Last 3 Encounters:  12/09/18 (!) 344 lb (156 kg)  09/08/18 (!) 338 lb (153.3 kg)  06/09/18 (!) 334 lb (151.5 kg)    Physical Exam Vitals signs and nursing note reviewed.  Constitutional:      Appearance: Normal appearance. She is not ill-appearing.  HENT:     Head: Atraumatic.  Eyes:     Extraocular Movements: Extraocular movements intact.     Conjunctiva/sclera: Conjunctivae normal.  Neck:     Musculoskeletal: Normal range of motion and neck supple.  Cardiovascular:     Rate and Rhythm: Normal rate and regular rhythm.     Heart sounds: Normal heart sounds.  Pulmonary:     Effort: Pulmonary effort is normal.     Breath sounds: Normal breath sounds.  Musculoskeletal: Normal range of motion.        General: Swelling (trace edema b/l ankles)  present.  Skin:    General: Skin is warm and dry.  Neurological:     Mental Status: She is alert and oriented to person, place, and time.  Psychiatric:        Mood and Affect: Mood normal.        Thought Content: Thought content normal.        Judgment: Judgment normal.     Results for orders placed or performed in visit on 09/08/18  WET PREP FOR [place], YEAST, CLUE   Specimen: Urine   URINE  Result Value Ref Range   Trichomonas Exam Negative Negative   Yeast Exam Negative Negative   Clue Cell Exam Positive (A) Negative  GC/Chlamydia Probe Amp   Specimen: Vaginal Fluid   UR  Result Value Ref Range   Chlamydia trachomatis, NAA Negative Negative   Neisseria Gonorrhoeae by PCR Negative Negative  Microscopic Examination   URINE  Result Value Ref Range   WBC, UA 0-5 0 - 5 /hpf   RBC None seen 0 - 2 /hpf   Epithelial Cells (non renal) 0-10 0 - 10 /hpf   Bacteria, UA Few (A) None seen/Few  UA/M w/rflx Culture, Routine   Specimen: Urine   URINE  Result Value Ref Range   Specific Gravity, UA 1.020 1.005 - 1.030   pH, UA 6.0 5.0 - 7.5   Color, UA Yellow Yellow   Appearance Ur Clear Clear   Leukocytes,UA Negative Negative   Protein,UA 1+ (A) Negative/Trace   Glucose, UA Negative Negative   Ketones, UA Negative Negative   RBC, UA Negative Negative   Bilirubin, UA Negative Negative   Urobilinogen, Ur 0.2 0.2 - 1.0 mg/dL   Nitrite, UA Negative Negative   Microscopic Examination See below:   Cytology - PAP  Result Value Ref Range   Adequacy      Satisfactory for evaluation  endocervical/transformation zone component ABSENT.   Diagnosis      NEGATIVE FOR INTRAEPITHELIAL LESIONS OR MALIGNANCY.   Diagnosis SHIFT IN [place] SUGGESTIVE OF BACTERIAL VAGINOSIS.    HPV NOT DETECTED    Material Submitted CervicoVaginal Pap [ThinPrep Imaged]    CYTOLOGY - PAP PAP RESULT       Assessment & Plan:   Problem List Items Addressed This Visit      Cardiovascular and Mediastinum    Benign hypertension - Primary    Decrease amlodipine to 5 mg and increase metoprolol to 100 mg XL dose. Continue chlorthalidone 25 mg. Monitor home readings closely and continue excellent lifestyle habit modifications/weight loss. F/u in 1 month for recheck and bmp      Relevant Medications   amLODipine (NORVASC) 5 MG tablet   metoprolol succinate (TOPROL-XL) 100 MG 24 hr tablet     Other   Morbid obesity (HCC)    Continue walking program that she's recently started, eating better. Down 10 lb, congratulated success       Other Visit Diagnoses    Leg swelling       Suspect related to the 10 mg amlodipine. Will reduce to 5 mg and monitor for improvement. Salt restriction, push fluids, compression stockings prn       Follow up plan: Return in about 4 weeks (around 01/06/2019) for HTN, bmp.

## 2019-01-23 ENCOUNTER — Telehealth: Payer: Self-pay | Admitting: Family Medicine

## 2019-01-23 NOTE — Telephone Encounter (Signed)
Pt states she was treated for BV a few weeks ago and is exp some of the same sx.  Pt is not sure if infection was entirely cleared with 1st abx treatment. Please let pt know if she needs to come in for re-check or if she can simply take another round of abx.    [phone]

## 2019-01-23 NOTE — Telephone Encounter (Signed)
Pt called back but did not want to schedule an appt. She would just like meds to be sent in. Advised pt that generally we see the pt for a recheck but pt declined and would like a call back getting more antibiotics called in.

## 2019-01-23 NOTE — Telephone Encounter (Signed)
I have not seen her for this issue since 08/2018 so she may have gone somewhere else several weeks ago but it was not here. She will need to come in for evaluation prior to any medications. It looks like she is already scheduled for a BP recheck soon if she would like to do both things in one visit or she can make another appointment.

## 2019-01-23 NOTE — Telephone Encounter (Signed)
LVM for pt to call back.

## 2019-01-24 ENCOUNTER — Other Ambulatory Visit: Payer: Self-pay | Admitting: Family Medicine

## 2019-01-24 ENCOUNTER — Ambulatory Visit (INDEPENDENT_AMBULATORY_CARE_PROVIDER_SITE_OTHER): Payer: Managed Care, Other (non HMO) | Admitting: Family Medicine

## 2019-01-24 ENCOUNTER — Other Ambulatory Visit: Payer: Self-pay

## 2019-01-24 ENCOUNTER — Encounter: Payer: Self-pay | Admitting: Family Medicine

## 2019-01-24 VITALS — BP 137/86 | HR 64 | Temp 99.6°F | Ht 66.0 in | Wt 343.0 lb

## 2019-01-24 DIAGNOSIS — I10 Essential (primary) hypertension: Secondary | ICD-10-CM

## 2019-01-24 DIAGNOSIS — Z720 Tobacco use: Secondary | ICD-10-CM

## 2019-01-24 DIAGNOSIS — N898 Other specified noninflammatory disorders of vagina: Secondary | ICD-10-CM | POA: Diagnosis not present

## 2019-01-24 DIAGNOSIS — N760 Acute vaginitis: Secondary | ICD-10-CM

## 2019-01-24 DIAGNOSIS — B9689 Other specified bacterial agents as the cause of diseases classified elsewhere: Secondary | ICD-10-CM

## 2019-01-24 LAB — UA/M W/RFLX CULTURE, ROUTINE
Bilirubin, UA: NEGATIVE
Glucose, UA: NEGATIVE
Ketones, UA: NEGATIVE
Leukocytes,UA: NEGATIVE
Nitrite, UA: NEGATIVE
Protein,UA: NEGATIVE
RBC, UA: NEGATIVE
Specific Gravity, UA: 1.015 (ref 1.005–1.030)
Urobilinogen, Ur: 0.2 mg/dL (ref 0.2–1.0)
pH, UA: 6 (ref 5.0–7.5)

## 2019-01-24 LAB — MICROALBUMIN, URINE WAIVED
Creatinine, Urine Waived: 100 mg/dL (ref 10–300)
Microalb, Ur Waived: 10 mg/L (ref 0–19)
Microalb/Creat Ratio: <30 mg/g (ref <30)

## 2019-01-24 LAB — WET PREP FOR TRICH, YEAST, CLUE
Clue Cell Exam: POSITIVE — AB
Trichomonas Exam: NEGATIVE
Yeast Exam: NEGATIVE

## 2019-01-24 LAB — BAYER DCA HB A1C WAIVED: HB A1C (BAYER DCA - WAIVED): 5.7 % (ref <7.0)

## 2019-01-24 MED ORDER — METRONIDAZOLE 500 MG PO TABS: 500.0000 mg | ORAL_TABLET | Freq: Two times a day (BID) | ORAL | 0 refills | Status: DC

## 2019-01-24 NOTE — Progress Notes (Signed)
BP 137/86   Pulse 64   Temp 99.6 F (37.6 C) (Oral)   Ht 5\' 6"  (1.676 m)   Wt (!) 343 lb (155.6 kg)   SpO2 100%   BMI 55.36 kg/m    Subjective:    Patient ID: Nancy Garza, female    DOB: Jan 17, 1981, 38 y.o.   MRN: [id number]  HPI: Nancy Garza is a 38 y.o. female  Chief Complaint  Patient presents with  . Vaginal issues    strong smell during intercurse last Saturday   VAGINAL SMELL Duration: 1x 3-4 days ago, during intercourse Discharge description: thin white about a week ago  Pruritus: no Dysuria: no Malodorous: yes Urinary frequency: no Fevers: no Abdominal pain: no  Sexual activity: practicing safe sex History of sexually transmitted diseases: no Recent antibiotic use: no Context: better  Treatments attempted: none  HYPERTENSION Hypertension status: stable  Satisfied with current treatment? yes Duration of hypertension: chronic BP monitoring frequency:  not checking BP medication side effects:  no Medication compliance: excellent compliance Previous BP meds: amlodipine, chlorthalidone, metoprol Aspirin: no Recurrent headaches: no Visual changes: no Palpitations: no Dyspnea: no Chest pain: no Lower extremity edema: no Dizzy/lightheaded: no  Relevant past medical, surgical, family and social history reviewed and updated as indicated. Interim medical history since our last visit reviewed. Allergies and medications reviewed and updated.  Review of Systems  Constitutional: Negative.   Respiratory: Negative.   Cardiovascular: Negative.   Gastrointestinal: Negative.   Genitourinary: Positive for vaginal discharge. Negative for decreased urine volume, difficulty urinating, dyspareunia, dysuria, enuresis, flank pain, frequency, genital sores, hematuria, menstrual problem, pelvic pain, urgency, vaginal bleeding and vaginal pain.  Neurological: Negative.   Psychiatric/Behavioral: Negative.     Per HPI unless specifically indicated above   Objective:    BP 137/86   Pulse 64   Temp 99.6 F (37.6 C) (Oral)   Ht 5\' 6"  (1.676 m)   Wt (!) 343 lb (155.6 kg)   SpO2 100%   BMI 55.36 kg/m   Wt Readings from Last 3 Encounters:  01/24/19 (!) 343 lb (155.6 kg)  12/09/18 (!) 344 lb (156 kg)  09/08/18 (!) 338 lb (153.3 kg)    Physical Exam Vitals signs and nursing note reviewed.  Constitutional:      General: She is not in acute distress.    Appearance: Normal appearance. She is not ill-appearing, toxic-appearing or diaphoretic.  HENT:     Head: Normocephalic and atraumatic.     Right Ear: External ear normal.     Left Ear: External ear normal.     Nose: Nose normal.     Mouth/Throat:     Mouth: Mucous membranes are moist.     Pharynx: Oropharynx is clear.  Eyes:     General: No scleral icterus.       Right eye: No discharge.        Left eye: No discharge.     Extraocular Movements: Extraocular movements intact.     Conjunctiva/sclera: Conjunctivae normal.     Pupils: Pupils are equal, round, and reactive to light.  Neck:     Musculoskeletal: Normal range of motion and neck supple.  Cardiovascular:     Rate and Rhythm: Normal rate and regular rhythm.     Pulses: Normal pulses.     Heart sounds: Normal heart sounds. No murmur. No friction rub. No gallop.   Pulmonary:     Effort: Pulmonary effort is normal. No respiratory distress.  Breath sounds: Normal breath sounds. No stridor. No wheezing, rhonchi or rales.  Chest:     Chest wall: No tenderness.  Musculoskeletal: Normal range of motion.  Skin:    General: Skin is warm and dry.     Capillary Refill: Capillary refill takes less than 2 seconds.     Coloration: Skin is not jaundiced or pale.     Findings: No bruising, erythema, lesion or rash.  Neurological:     General: No focal deficit present.     Mental Status: She is alert and oriented to person, place, and time. Mental status is at baseline.  Psychiatric:        Mood and Affect: Mood normal.         Behavior: Behavior normal.        Thought Content: Thought content normal.        Judgment: Judgment normal.     Results for orders placed or performed in visit on 09/08/18  WET PREP FOR [place], YEAST, CLUE   Specimen: Urine   URINE  Result Value Ref Range   Trichomonas Exam Negative Negative   Yeast Exam Negative Negative   Clue Cell Exam Positive (A) Negative  GC/Chlamydia Probe Amp   Specimen: Vaginal Fluid   UR  Result Value Ref Range   Chlamydia trachomatis, NAA Negative Negative   Neisseria Gonorrhoeae by PCR Negative Negative  Microscopic Examination   URINE  Result Value Ref Range   WBC, UA 0-5 0 - 5 /hpf   RBC None seen 0 - 2 /hpf   Epithelial Cells (non renal) 0-10 0 - 10 /hpf   Bacteria, UA Few (A) None seen/Few  UA/M w/rflx Culture, Routine   Specimen: Urine   URINE  Result Value Ref Range   Specific Gravity, UA 1.020 1.005 - 1.030   pH, UA 6.0 5.0 - 7.5   Color, UA Yellow Yellow   Appearance Ur Clear Clear   Leukocytes,UA Negative Negative   Protein,UA 1+ (A) Negative/Trace   Glucose, UA Negative Negative   Ketones, UA Negative Negative   RBC, UA Negative Negative   Bilirubin, UA Negative Negative   Urobilinogen, Ur 0.2 0.2 - 1.0 mg/dL   Nitrite, UA Negative Negative   Microscopic Examination See below:   Cytology - PAP  Result Value Ref Range   Adequacy      Satisfactory for evaluation  endocervical/transformation zone component ABSENT.   Diagnosis      NEGATIVE FOR INTRAEPITHELIAL LESIONS OR MALIGNANCY.   Diagnosis SHIFT IN [place] SUGGESTIVE OF BACTERIAL VAGINOSIS.    HPV NOT DETECTED    Material Submitted CervicoVaginal Pap [ThinPrep Imaged]    CYTOLOGY - PAP PAP RESULT       Assessment & Plan:   Problem List Items Addressed This Visit      Cardiovascular and Mediastinum   Benign hypertension    Continue current regimen. Checking labs today. Will get her back for physical before she runs out of meds. Not due for refill today.       Relevant Orders   Comprehensive metabolic panel   Microalbumin, Urine Waived   CBC with Differential/Platelet   Bayer DCA Hb A1c Waived     Other   Morbid obesity ([place])    Congratulated patient on losing another pound! Will check labs today. Await results.       Relevant Orders   Comprehensive metabolic panel   Lipid Panel w/o Chol/HDL Ratio   TSH   Bayer DCA Hb A1c  Waived    Other Visit Diagnoses    Vaginal odor    -  Primary   + clue cells   Relevant Orders   UA/M w/rflx Culture, Routine   WET PREP FOR TRICH, YEAST, CLUE   Tobacco abuse       Labs drawn today. Await results.    Relevant Orders   CBC with Differential/Platelet   BV (bacterial vaginosis)       Will treat with flagyl. Call with any concerns.    Relevant Medications   metroNIDAZOLE (FLAGYL) 500 MG tablet       Follow up plan: Return Before 10/10 for physical with Fleet Contras.

## 2019-01-24 NOTE — Telephone Encounter (Signed)
Requested medication (s) are due for refill today: yes  Requested medication (s) are on the active medication list: yes  Last refill:  12/09/2018  Future visit scheduled: yes  Notes to clinic:  The original prescription was discontinued on 12/09/2018 by Volney American, PA-C for the following reason: Dose change. Renewing this prescription may not be appropriate   Requested Prescriptions  Pending Prescriptions Disp Refills   amLODipine (NORVASC) 10 MG tablet [Pharmacy Med Name: AMLODIPINE BESYLATE 10MG  TABLETS] 90 tablet 0    Sig: TAKE 1 TABLET(10 MG) BY MOUTH DAILY     Cardiovascular:  Calcium Channel Blockers Passed - 01/24/2019  2:55 PM      Passed - Last BP in normal range    BP Readings from Last 1 Encounters:  01/24/19 137/86         Passed - Valid encounter within last 6 months    Recent Outpatient Visits          Today Vaginal odor   [place], [place], DO   1 month ago Benign hypertension   [place], [place], [place]   3 months ago Benign hypertension   [place], [place], [place]   3 months ago Benign hypertension   [place], [place], [place]   4 months ago Benign hypertension   [place], Lilia [place], [place]      Future Appointments            In 1 month Orene Desanctis, [place], [place], [place]

## 2019-01-24 NOTE — Assessment & Plan Note (Signed)
Continue current regimen. Checking labs today. Will get her back for physical before she runs out of meds. Not due for refill today.

## 2019-01-24 NOTE — Progress Notes (Deleted)
There were no vitals taken for this visit.   Subjective:    Patient ID: Nancy Garza, female    DOB: 03/08/1981, 38 y.o.   MRN: [id number]  HPI: Nancy Garza is a 38 y.o. female presenting on 01/24/2019 for comprehensive medical examination. Current medical complaints include:  HYPERTENSION Hypertension status: {Blank single:19197::"controlled","uncontrolled","better","worse","exacerbated","stable"}  Satisfied with current treatment? {Blank single:19197::"yes","no"} Duration of hypertension: {Blank single:19197::"chronic","months","years"} BP monitoring frequency:  {Blank single:19197::"not checking","rarely","daily","weekly","monthly","a few times a day","a few times a week","a few times a month"} BP range:  BP medication side effects:  {Blank single:19197::"yes","no"} Medication compliance: {Blank single:19197::"excellent compliance","good compliance","fair compliance","poor compliance"} Previous BP meds:{Blank multiple:19196::"none","amlodipine","amlodipine/benazepril","atenolol","benazepril","benazepril/HCTZ","bisoprolol (bystolic)","carvedilol","chlorthalidone","clonidine","diltiazem","exforge HCT","HCTZ","irbesartan (avapro)","labetalol","lisinopril","lisinopril-HCTZ","losartan (cozaar)","methyldopa","nifedipine","olmesartan (benicar)","olmesartan-HCTZ","quinapril","ramipril","spironalactone","tekturna","valsartan","valsartan-HCTZ","verapamil"} Aspirin: {Blank single:19197::"yes","no"} Recurrent headaches: {Blank single:19197::"yes","no"} Visual changes: {Blank single:19197::"yes","no"} Palpitations: {Blank single:19197::"yes","no"} Dyspnea: {Blank single:19197::"yes","no"} Chest pain: {Blank single:19197::"yes","no"} Lower extremity edema: {Blank single:19197::"yes","no"} Dizzy/lightheaded: {Blank single:19197::"yes","no"}  Menopausal Symptoms: {Blank single:19197::"yes","no"}  Depression Screen done today and results listed below:  Depression screen [facility] 2/9 01/27/2017  12/14/2014  Decreased Interest 0 0  Down, Depressed, Hopeless 0 0  PHQ - 2 Score 0 0    Past Medical History:  Past Medical History:  Diagnosis Date  . Abnormal Pap smear of cervix    LEEP procedure  . Hypertension     Surgical History:  No past surgical history on file.  Medications:  Current Outpatient Medications on File Prior to Visit  Medication Sig  . amLODipine (NORVASC) 5 MG tablet Take 1 tablet (5 mg total) by mouth daily. KEEP UPCOMING APPT  . chlorthalidone (HYGROTON) 25 MG tablet Take 1 tablet (25 mg total) by mouth daily.  . metoprolol succinate (TOPROL-XL) 100 MG 24 hr tablet Take 1 tablet (100 mg total) by mouth daily. KEEP UPCOMING APPT   No current facility-administered medications on file prior to visit.     Allergies:  No Known Allergies  Social History:  Social History   Socioeconomic History  . Marital status: Single    Spouse name: Not on file  . Number of children: Not on file  . Years of education: Not on file  . Highest education level: Not on file  Occupational History  . Not on file  Social Needs  . Financial resource strain: Not on file  . Food insecurity    Worry: Not on file    Inability: Not on file  . Transportation needs    Medical: Not on file    Non-medical: Not on file  Tobacco Use  . Smoking status: Current Every Day Smoker  . Smokeless tobacco: Never Used  Substance and Sexual Activity  . Alcohol use: Yes    Alcohol/week: 3.0 standard drinks    Types: 3 Shots of liquor per week    Comment: on the weekends  . Drug use: Yes    Types: Marijuana    Comment: history of marijuana use  . Sexual activity: Not on file  Lifestyle  . Physical activity    Days per week: Not on file    Minutes per session: Not on file  . Stress: Not on file  Relationships  . Social Musician on phone: Not on file    Gets together: Not on file    Attends religious service: Not on file    Active member of club or organization: Not  on file    Attends meetings of clubs or organizations: Not on file    Relationship status: Not on file  . Intimate partner violence    Fear  of current or ex partner: Not on file    Emotionally abused: Not on file    Physically abused: Not on file    Forced sexual activity: Not on file  Other Topics Concern  . Not on file  Social History Narrative  . Not on file   Social History   Tobacco Use  Smoking Status Current Every Day Smoker  Smokeless Tobacco Never Used   Social History   Substance and Sexual Activity  Alcohol Use Yes  . Alcohol/week: 3.0 standard drinks  . Types: 3 Shots of liquor per week   Comment: on the weekends    Family History:  Family History  Problem Relation Age of Onset  . Diabetes Mother   . Hypertension Mother   . Stroke Mother   . COPD Mother   . Hypertension Father   . Diabetes Maternal Grandmother   . Hypertension Maternal Grandmother   . Stroke Maternal Grandmother   . Diabetes Paternal Grandmother   . Hypertension Paternal Grandmother   . Stroke Paternal Grandmother     Past medical history, surgical history, medications, allergies, family history and social history reviewed with patient today and changes made to appropriate areas of the chart.   ROS  All other ROS negative except what is listed above and in the HPI.      Objective:    There were no vitals taken for this visit.  Wt Readings from Last 3 Encounters:  12/09/18 (!) 344 lb (156 kg)  09/08/18 (!) 338 lb (153.3 kg)  06/09/18 (!) 334 lb (151.5 kg)    Physical Exam  Results for orders placed or performed in visit on 09/08/18  WET PREP FOR TRICH, YEAST, CLUE   Specimen: Urine   URINE  Result Value Ref Range   Trichomonas Exam Negative Negative   Yeast Exam Negative Negative   Clue Cell Exam Positive (A) Negative  GC/Chlamydia Probe Amp   Specimen: Vaginal Fluid   UR  Result Value Ref Range   Chlamydia trachomatis, NAA Negative Negative   Neisseria Gonorrhoeae  by PCR Negative Negative  Microscopic Examination   URINE  Result Value Ref Range   WBC, UA 0-5 0 - 5 /hpf   RBC None seen 0 - 2 /hpf   Epithelial Cells (non renal) 0-10 0 - 10 /hpf   Bacteria, UA Few (A) None seen/Few  UA/M w/rflx Culture, Routine   Specimen: Urine   URINE  Result Value Ref Range   Specific Gravity, UA 1.020 1.005 - 1.030   pH, UA 6.0 5.0 - 7.5   Color, UA Yellow Yellow   Appearance Ur Clear Clear   Leukocytes,UA Negative Negative   Protein,UA 1+ (A) Negative/Trace   Glucose, UA Negative Negative   Ketones, UA Negative Negative   RBC, UA Negative Negative   Bilirubin, UA Negative Negative   Urobilinogen, Ur 0.2 0.2 - 1.0 mg/dL   Nitrite, UA Negative Negative   Microscopic Examination See below:   Cytology - PAP  Result Value Ref Range   Adequacy      Satisfactory for evaluation  endocervical/transformation zone component ABSENT.   Diagnosis      NEGATIVE FOR INTRAEPITHELIAL LESIONS OR MALIGNANCY.   Diagnosis SHIFT IN [place] SUGGESTIVE OF BACTERIAL VAGINOSIS.    HPV NOT DETECTED    Material Submitted CervicoVaginal Pap [ThinPrep Imaged]    CYTOLOGY - PAP PAP RESULT       Assessment & Plan:   Problem List Items Addressed This Visit  Cardiovascular and Mediastinum   Benign hypertension    Other Visit Diagnoses    Routine general medical examination at a health care facility    -  Primary       Follow up plan: No follow-ups on file.   LABORATORY TESTING:  - Pap smear: up to date  IMMUNIZATIONS:   - Tdap: Tetanus vaccination status reviewed: last tetanus booster within 10 years. - Influenza: {Blank single:19197::"Up to date","Administered today","Postponed to flu season","Refused","Given elsewhere"} - Pneumovax: {Blank single:19197::"Up to date","Administered today","Not applicable","Refused","Given elsewhere"}  PATIENT COUNSELING:   Advised to take 1 mg of folate supplement per day if capable of pregnancy.   Sexuality: Discussed  sexually transmitted diseases, partner selection, use of condoms, avoidance of unintended pregnancy  and contraceptive alternatives.   Advised to avoid cigarette smoking.  I discussed with the patient that most people either abstain from alcohol or drink within safe limits (<=14/week and <=4 drinks/occasion for males, <=7/weeks and <= 3 drinks/occasion for females) and that the risk for alcohol disorders and other health effects rises proportionally with the number of drinks per week and how often a drinker exceeds daily limits.  Discussed cessation/primary prevention of drug use and availability of treatment for abuse.   Diet: Encouraged to adjust caloric intake to maintain  or achieve ideal body weight, to reduce intake of dietary saturated fat and total fat, to limit sodium intake by avoiding high sodium foods and not adding table salt, and to maintain adequate dietary potassium and calcium preferably from fresh fruits, vegetables, and low-fat dairy products.    stressed the importance of regular exercise  Injury prevention: Discussed safety belts, safety helmets, smoke detector, smoking near bedding or upholstery.   Dental health: Discussed importance of regular tooth brushing, flossing, and dental visits.    NEXT PREVENTATIVE PHYSICAL DUE IN 1 YEAR. No follow-ups on file.

## 2019-01-24 NOTE — Assessment & Plan Note (Signed)
Congratulated patient on losing another pound! Will check labs today. Await results.

## 2019-01-25 ENCOUNTER — Telehealth: Payer: Self-pay | Admitting: Family Medicine

## 2019-01-25 DIAGNOSIS — D649 Anemia, unspecified: Secondary | ICD-10-CM | POA: Insufficient documentation

## 2019-01-25 LAB — CBC WITH DIFFERENTIAL/PLATELET
Basophils Absolute: 0.1 10*3/uL (ref 0.0–0.2)
Basos: 1 %
EOS (ABSOLUTE): 0.2 10*3/uL (ref 0.0–0.4)
Eos: 3 %
Hematocrit: 30 % — ABNORMAL LOW (ref 34.0–46.6)
Hemoglobin: 8.9 g/dL — ABNORMAL LOW (ref 11.1–15.9)
Immature Grans (Abs): 0 10*3/uL (ref 0.0–0.1)
Immature Granulocytes: 0 %
Lymphocytes Absolute: 2.2 10*3/uL (ref 0.7–3.1)
Lymphs: 34 %
MCH: 22.1 pg — ABNORMAL LOW (ref 26.6–33.0)
MCHC: 29.7 g/dL — ABNORMAL LOW (ref 31.5–35.7)
MCV: 75 fL — ABNORMAL LOW (ref 79–97)
Monocytes Absolute: 0.6 10*3/uL (ref 0.1–0.9)
Monocytes: 9 %
Neutrophils Absolute: 3.4 10*3/uL (ref 1.4–7.0)
Neutrophils: 53 %
Platelets: 397 10*3/uL (ref 150–450)
RBC: 4.02 x10E6/uL (ref 3.77–5.28)
RDW: 18.6 % — ABNORMAL HIGH (ref 11.7–15.4)
WBC: 6.4 10*3/uL (ref 3.4–10.8)

## 2019-01-25 LAB — COMPREHENSIVE METABOLIC PANEL
ALT: 12 IU/L (ref 0–32)
AST: 17 IU/L (ref 0–40)
Albumin/Globulin Ratio: 1.3 (ref 1.2–2.2)
Albumin: 4.2 g/dL (ref 3.8–4.8)
Alkaline Phosphatase: 49 IU/L (ref 39–117)
BUN/Creatinine Ratio: 18 (ref 9–23)
BUN: 13 mg/dL (ref 6–20)
Bilirubin Total: 0.2 mg/dL (ref 0.0–1.2)
CO2: 27 mmol/L (ref 20–29)
Calcium: 9.1 mg/dL (ref 8.7–10.2)
Chloride: 92 mmol/L — ABNORMAL LOW (ref 96–106)
Creatinine, Ser: 0.71 mg/dL (ref 0.57–1.00)
GFR calc Af Amer: 127 mL/min/{1.73_m2} (ref >59)
GFR calc non Af Amer: 110 mL/min/{1.73_m2} (ref >59)
Globulin, Total: 3.3 g/dL (ref 1.5–4.5)
Glucose: 92 mg/dL (ref 65–99)
Potassium: 3.8 mmol/L (ref 3.5–5.2)
Sodium: 135 mmol/L (ref 134–144)
Total Protein: 7.5 g/dL (ref 6.0–8.5)

## 2019-01-25 LAB — TSH: TSH: 1.44 u[IU]/mL (ref 0.450–4.500)

## 2019-01-25 LAB — LIPID PANEL W/O CHOL/HDL RATIO
Cholesterol, Total: 185 mg/dL (ref 100–199)
HDL: 36 mg/dL — ABNORMAL LOW (ref >39)
LDL Calculated: 115 mg/dL — ABNORMAL HIGH (ref 0–99)
Triglycerides: 170 mg/dL — ABNORMAL HIGH (ref 0–149)
VLDL Cholesterol Cal: 34 mg/dL (ref 5–40)

## 2019-01-25 MED ORDER — FERROUS SULFATE 324 (65 FE) MG PO TBEC: 1.0000 | DELAYED_RELEASE_TABLET | Freq: Three times a day (TID) | ORAL | 3 refills | Status: DC

## 2019-01-25 NOTE — Telephone Encounter (Signed)
Please let her know that her labs look good, but she's really anemic! I'd like her to start taking an iron pill 3x a day and then we'll recheck it when she comes back to see Apolonio Schneiders

## 2019-01-25 NOTE — Telephone Encounter (Signed)
Patient notified

## 2019-01-27 ENCOUNTER — Ambulatory Visit: Payer: Managed Care, Other (non HMO) | Admitting: Family Medicine

## 2019-02-01 ENCOUNTER — Telehealth: Payer: Self-pay

## 2019-02-01 NOTE — Telephone Encounter (Signed)
Refill on chlorthalidone 25mg 

## 2019-02-02 MED ORDER — CHLORTHALIDONE 25 MG PO TABS: 25.0000 mg | ORAL_TABLET | Freq: Every day | ORAL | 0 refills | Status: DC

## 2019-02-02 NOTE — Telephone Encounter (Signed)
Rx sent 

## 2019-03-03 ENCOUNTER — Ambulatory Visit (INDEPENDENT_AMBULATORY_CARE_PROVIDER_SITE_OTHER): Payer: Managed Care, Other (non HMO) | Admitting: Family Medicine

## 2019-03-03 ENCOUNTER — Encounter: Payer: Self-pay | Admitting: Family Medicine

## 2019-03-03 ENCOUNTER — Other Ambulatory Visit: Payer: Self-pay

## 2019-03-03 VITALS — BP 129/83 | HR 74 | Temp 98.5°F | Ht 66.77 in | Wt 344.0 lb

## 2019-03-03 DIAGNOSIS — I10 Essential (primary) hypertension: Secondary | ICD-10-CM | POA: Diagnosis not present

## 2019-03-03 DIAGNOSIS — Z23 Encounter for immunization: Secondary | ICD-10-CM | POA: Diagnosis not present

## 2019-03-03 DIAGNOSIS — Z0000 Encounter for general adult medical examination without abnormal findings: Secondary | ICD-10-CM

## 2019-03-03 DIAGNOSIS — D509 Iron deficiency anemia, unspecified: Secondary | ICD-10-CM | POA: Diagnosis not present

## 2019-03-03 LAB — UA/M W/RFLX CULTURE, ROUTINE
Bilirubin, UA: NEGATIVE
Glucose, UA: NEGATIVE
Ketones, UA: NEGATIVE
Leukocytes,UA: NEGATIVE
Nitrite, UA: NEGATIVE
Protein,UA: NEGATIVE
RBC, UA: NEGATIVE
Specific Gravity, UA: 1.02 (ref 1.005–1.030)
Urobilinogen, Ur: 0.2 mg/dL (ref 0.2–1.0)
pH, UA: 6.5 (ref 5.0–7.5)

## 2019-03-03 NOTE — Progress Notes (Signed)
BP 129/83   Pulse 74   Temp 98.5 F (36.9 C)   Ht 5' 6.77" (1.696 m)   Wt (!) 344 lb (156 kg)   SpO2 98%   BMI 54.25 kg/m    Subjective:    Patient ID: Nancy Garza, female    DOB: [birth year], 38 y.o.   MRN: [id number]  HPI: Nancy Garza is a 38 y.o. female presenting on 03/03/2019 for comprehensive medical examination. Current medical complaints include:see below  Has not been checking home BPs lately, was previously getting 130s/80s when checked. Tolerating her medication well without issue. Notes some improvement in her swelling lately. Denies CP, HAs, dizziness.   Has been feeling fatigued, down and depressed, having some swelling and discoloration, some mild SOB. Was found in August to be anemic and since has been on OTC iron supplementation. Notes sxs did improve some since starting this regimen. No known hx of anemia, bleeding or bruising sources noted, known kidney or liver dz.   She currently lives with: Menopausal Symptoms: no  Depression Screen done today and results listed below:  Depression screen [facility] 2/9 03/03/2019 01/27/2017 12/14/2014  Decreased Interest 0 0 0  Down, Depressed, Hopeless 0 0 0  PHQ - 2 Score 0 0 0    The patient does not have a history of falls. I did complete a risk assessment for falls. A plan of care for falls was documented.   Past Medical History:  Past Medical History:  Diagnosis Date  . Abnormal Pap smear of cervix    LEEP procedure  . Hypertension     Surgical History:  History reviewed. No pertinent surgical history.  Medications:  Current Outpatient Medications on File Prior to Visit  Medication Sig  . amLODipine (NORVASC) 5 MG tablet Take 1 tablet (5 mg total) by mouth daily. KEEP UPCOMING APPT  . chlorthalidone (HYGROTON) 25 MG tablet Take 1 tablet (25 mg total) by mouth daily.  . ferrous sulfate 324 (65 Fe) MG TBEC Take 1 tablet (325 mg total) by mouth 3 (three) times daily.  . metoprolol succinate (TOPROL-XL)  100 MG 24 hr tablet Take 1 tablet (100 mg total) by mouth daily. KEEP UPCOMING APPT   No current facility-administered medications on file prior to visit.     Allergies:  No Known Allergies  Social History:  Social History   Socioeconomic History  . Marital status: Single    Spouse name: Not on file  . Number of children: Not on file  . Years of education: Not on file  . Highest education level: Not on file  Occupational History  . Not on file  Social Needs  . Financial resource strain: Not on file  . Food insecurity    Worry: Not on file    Inability: Not on file  . Transportation needs    Medical: Not on file    Non-medical: Not on file  Tobacco Use  . Smoking status: Current Every Day Smoker  . Smokeless tobacco: Never Used  Substance and Sexual Activity  . Alcohol use: Yes    Alcohol/week: 3.0 standard drinks    Types: 3 Shots of liquor per week    Comment: on the weekends  . Drug use: Yes    Types: Marijuana    Comment: history of marijuana use  . Sexual activity: Not on file  Lifestyle  . Physical activity    Days per week: Not on file    Minutes per session: Not on  file  . Stress: Not on file  Relationships  . Social Musician on phone: Not on file    Gets together: Not on file    Attends religious service: Not on file    Active member of club or organization: Not on file    Attends meetings of clubs or organizations: Not on file    Relationship status: Not on file  . Intimate partner violence    Fear of current or ex partner: Not on file    Emotionally abused: Not on file    Physically abused: Not on file    Forced sexual activity: Not on file  Other Topics Concern  . Not on file  Social History Narrative  . Not on file   Social History   Tobacco Use  Smoking Status Current Every Day Smoker  Smokeless Tobacco Never Used   Social History   Substance and Sexual Activity  Alcohol Use Yes  . Alcohol/week: 3.0 standard drinks  .  Types: 3 Shots of liquor per week   Comment: on the weekends    Family History:  Family History  Problem Relation Age of Onset  . Diabetes Mother   . Hypertension Mother   . Stroke Mother   . COPD Mother   . Hypertension Father   . Diabetes Maternal Grandmother   . Hypertension Maternal Grandmother   . Stroke Maternal Grandmother   . Diabetes Paternal Grandmother   . Hypertension Paternal Grandmother   . Stroke Paternal Grandmother     Past medical history, surgical history, medications, allergies, family history and social history reviewed with patient today and changes made to appropriate areas of the chart.   Review of Systems - General ROS: positive for  - fatigue Psychological ROS: negative Ophthalmic ROS: negative ENT ROS: negative Allergy and Immunology ROS: negative Hematological and Lymphatic ROS: positive for - fatigue Endocrine ROS: negative Breast ROS: negative for breast lumps Respiratory ROS: no cough, shortness of breath, or wheezing Cardiovascular ROS: no chest pain or dyspnea on exertion Gastrointestinal ROS: no abdominal pain, change in bowel habits, or black or bloody stools Genito-Urinary ROS: no dysuria, trouble voiding, or hematuria Musculoskeletal ROS: negative Neurological ROS: no TIA or stroke symptoms Dermatological ROS: negative All other ROS negative except what is listed above and in the HPI.      Objective:    BP 129/83   Pulse 74   Temp 98.5 F (36.9 C)   Ht 5' 6.77" (1.696 m)   Wt (!) 344 lb (156 kg)   SpO2 98%   BMI 54.25 kg/m   Wt Readings from Last 3 Encounters:  03/03/19 (!) 344 lb (156 kg)  01/24/19 (!) 343 lb (155.6 kg)  12/09/18 (!) 344 lb (156 kg)    Physical Exam Vitals signs and nursing note reviewed.  Constitutional:      General: She is not in acute distress.    Appearance: She is well-developed.  HENT:     Head: Atraumatic.     Right Ear: External ear normal.     Left Ear: External ear normal.     Nose: Nose  normal.     Mouth/Throat:     Pharynx: No oropharyngeal exudate.  Eyes:     General: No scleral icterus.    Conjunctiva/sclera: Conjunctivae normal.     Pupils: Pupils are equal, round, and reactive to light.  Neck:     Musculoskeletal: Normal range of motion and neck supple.  Thyroid: No thyromegaly.  Cardiovascular:     Rate and Rhythm: Normal rate and regular rhythm.     Heart sounds: Normal heart sounds.  Pulmonary:     Effort: Pulmonary effort is normal. No respiratory distress.     Breath sounds: Normal breath sounds.  Chest:     Breasts:        Right: No mass, skin change or tenderness.        Left: No mass, nipple discharge or tenderness.  Abdominal:     General: Bowel sounds are normal.     Palpations: Abdomen is soft. There is no mass.     Tenderness: There is no abdominal tenderness.  Genitourinary:    Comments: GU exam declined today Musculoskeletal: Normal range of motion.        General: No tenderness.  Lymphadenopathy:     Cervical: No cervical adenopathy.     Upper Body:     Right upper body: No axillary adenopathy.     Left upper body: No axillary adenopathy.  Skin:    General: Skin is warm and dry.     Findings: No rash.  Neurological:     Mental Status: She is alert and oriented to person, place, and time.     Cranial Nerves: No cranial nerve deficit.  Psychiatric:        Behavior: Behavior normal.     Results for orders placed or performed in visit on 03/03/19  CBC with Differential/Platelet  Result Value Ref Range   WBC 6.7 3.4 - 10.8 x10E3/uL   RBC 4.21 3.77 - 5.28 x10E6/uL   Hemoglobin 9.6 (L) 11.1 - 15.9 g/dL   Hematocrit 16.133.0 (L) 09.634.0 - 46.6 %   MCV 78 (L) 79 - 97 fL   MCH 22.8 (L) 26.6 - 33.0 pg   MCHC 29.1 (L) 31.5 - 35.7 g/dL   RDW 04.521.5 (H) 40.911.7 - 81.115.4 %   Platelets 427 150 - 450 x10E3/uL   Neutrophils 53 Not Estab. %   Lymphs 33 Not Estab. %   Monocytes 7 Not Estab. %   Eos 6 Not Estab. %   Basos 1 Not Estab. %   Neutrophils  Absolute 3.6 1.4 - 7.0 x10E3/uL   Lymphocytes Absolute 2.2 0.7 - 3.1 x10E3/uL   Monocytes Absolute 0.4 0.1 - 0.9 x10E3/uL   EOS (ABSOLUTE) 0.4 0.0 - 0.4 x10E3/uL   Basophils Absolute 0.0 0.0 - 0.2 x10E3/uL   Immature Granulocytes 0 Not Estab. %   Immature Grans (Abs) 0.0 0.0 - 0.1 x10E3/uL  Comprehensive metabolic panel  Result Value Ref Range   Glucose 86 65 - 99 mg/dL   BUN 16 6 - 20 mg/dL   Creatinine, Ser 9.140.84 0.57 - 1.00 mg/dL   GFR calc non Af Amer 90 >59 mL/min/1.73   GFR calc Af Amer 103 >59 mL/min/1.73   BUN/Creatinine Ratio 19 9 - 23   Sodium 138 134 - 144 mmol/L   Potassium 4.4 3.5 - 5.2 mmol/L   Chloride 97 96 - 106 mmol/L   CO2 26 20 - 29 mmol/L   Calcium 9.4 8.7 - 10.2 mg/dL   Total Protein 7.4 6.0 - 8.5 g/dL   Albumin 4.1 3.8 - 4.8 g/dL   Globulin, Total 3.3 1.5 - 4.5 g/dL   Albumin/Globulin Ratio 1.2 1.2 - 2.2   Bilirubin Total <0.2 0.0 - 1.2 mg/dL   Alkaline Phosphatase 59 39 - 117 IU/L   AST 15 0 - 40 IU/L   ALT  13 0 - 32 IU/L  Lipid Panel w/o Chol/HDL Ratio  Result Value Ref Range   Cholesterol, Total 192 100 - 199 mg/dL   Triglycerides 409 0 - 149 mg/dL   HDL 37 (L) >81 mg/dL   VLDL Cholesterol Cal 24 5 - 40 mg/dL   LDL Chol Calc (NIH) 191 (H) 0 - 99 mg/dL  UA/M w/rflx Culture, Routine   Specimen: Urine   URINE  Result Value Ref Range   Specific Gravity, UA 1.020 1.005 - 1.030   pH, UA 6.5 5.0 - 7.5   Color, UA Yellow Yellow   Appearance Ur Clear Clear   Leukocytes,UA Negative Negative   Protein,UA Negative Negative/Trace   Glucose, UA Negative Negative   Ketones, UA Negative Negative   RBC, UA Negative Negative   Bilirubin, UA Negative Negative   Urobilinogen, Ur 0.2 0.2 - 1.0 mg/dL   Nitrite, UA Negative Negative  TSH  Result Value Ref Range   TSH 2.900 0.450 - 4.500 uIU/mL      Assessment & Plan:   Problem List Items Addressed This Visit      Cardiovascular and Mediastinum   Benign hypertension    BPs stable and WNL, continue  current regimen. Continue working on [employer], weight loss      Relevant Orders   CBC with Differential/Platelet (Completed)   Comprehensive metabolic panel (Completed)   UA/M w/rflx Culture, Routine (Completed)   TSH (Completed)     Other   Iron deficiency anemia    Will recheck CBC and adjust as needed. Has symptomatically improved on OTC supplementation. Continue working on increasing good dietary intake of iron       Other Visit Diagnoses    Annual physical exam    -  Primary   Relevant Orders   Lipid Panel w/o Chol/HDL Ratio (Completed)   Flu vaccine need       Relevant Orders   Flu Vaccine QUAD 36+ mos IM (Completed)       Follow up plan: Return in about 6 months (around 09/01/2019) for 6 month f/u.   LABORATORY TESTING:  - Pap smear: up to date  IMMUNIZATIONS:   - Tdap: Tetanus vaccination status reviewed: last tetanus booster within 10 years. - Influenza: Administered today  PATIENT COUNSELING:   Advised to take 1 mg of folate supplement per day if capable of pregnancy.   Sexuality: Discussed sexually transmitted diseases, partner selection, use of condoms, avoidance of unintended pregnancy  and contraceptive alternatives.   Advised to avoid cigarette smoking.  I discussed with the patient that most people either abstain from alcohol or drink within safe limits (<=14/week and <=4 drinks/occasion for males, <=7/weeks and <= 3 drinks/occasion for females) and that the risk for alcohol disorders and other health effects rises proportionally with the number of drinks per week and how often a drinker exceeds daily limits.  Discussed cessation/primary prevention of drug use and availability of treatment for abuse.   Diet: Encouraged to adjust caloric intake to maintain  or achieve ideal body weight, to reduce intake of dietary saturated fat and total fat, to limit sodium intake by avoiding high sodium foods and not adding table salt, and to maintain adequate dietary  potassium and calcium preferably from fresh fruits, vegetables, and low-fat dairy products.    stressed the importance of regular exercise  Injury prevention: Discussed safety belts, safety helmets, smoke detector, smoking near bedding or upholstery.   Dental health: Discussed importance of regular tooth brushing, flossing,  and dental visits.    NEXT PREVENTATIVE PHYSICAL DUE IN 1 YEAR. Return in about 6 months (around 09/01/2019) for 6 month f/u.

## 2019-03-03 NOTE — Patient Instructions (Addendum)
Hibiclens solution 1-2 times daily to affected area, can do warm compresses and neosporin. Pick up antibiotic if not getting better    Hidradenitis Suppurativa Hidradenitis suppurativa is a long-term (chronic) skin disease. It is similar to a severe form of acne, but it affects areas of the body where acne would be unusual, especially areas of the body where skin rubs against skin and becomes moist. These include:  Underarms.  Groin.  Genital area.  Buttocks.  Upper thighs.  Breasts. Hidradenitis suppurativa may start out as small lumps or pimples caused by blocked sweat glands or hair follicles. Pimples may develop into deep sores that break open (rupture) and drain pus. Over time, affected areas of skin may thicken and become scarred. This condition is rare and does not spread from person to person (non-contagious). What are the causes? The exact cause of this condition is not known. It may be related to:  Female and female hormones.  An overactive disease-fighting system (immune system). The immune system may over-react to blocked hair follicles or sweat glands and cause swelling and pus-filled sores. What increases the risk? You are more likely to develop this condition if you:  Are female.  Are 63-48 years old.  Have a family history of hidradenitis suppurativa.  Have a personal history of acne.  Are overweight.  Smoke.  Take the medicine lithium. What are the signs or symptoms? The first symptoms are usually painful bumps in the skin, similar to pimples. The condition may get worse over time (progress), or it may only cause mild symptoms. If the disease progresses, symptoms may include:  Skin bumps getting bigger and growing deeper into the skin.  Bumps rupturing and draining pus.  Itchy, infected skin.  Skin getting thicker and scarred.  Tunnels under the skin (fistulas) where pus drains from a bump.  Pain during daily activities, such as pain during walking  if your groin area is affected.  Emotional problems, such as stress or depression. This condition may affect your appearance and your ability or willingness to wear certain clothes or do certain activities. How is this diagnosed? This condition is diagnosed by a health care provider who specializes in skin diseases (dermatologist). You may be diagnosed based on:  Your symptoms and medical history.  A physical exam.  Testing a pus sample for infection.  Blood tests. How is this treated? Your treatment will depend on how severe your symptoms are. The same treatment will not work for everybody with this condition. You may need to try several treatments to find what works best for you. Treatment may include:  Cleaning and bandaging (dressing) your wounds as needed.  Lifestyle changes, such as new skin care routines.  Taking medicines, such as: ? Antibiotics. ? Acne medicines. ? Medicines to reduce the activity of the immune system. ? A diabetes medicine (metformin). ? Birth control pills, for women. ? Steroids to reduce swelling and pain.  Working with a mental health care provider, if you experience emotional distress due to this condition. If you have severe symptoms that do not get better with medicine, you may need surgery. Surgery may involve:  Using a laser to clear the skin and remove hair follicles.  Opening and draining deep sores.  Removing the areas of skin that are diseased and scarred. Follow these instructions at home: Medicines   Take over-the-counter and prescription medicines only as told by your health care provider.  If you were prescribed an antibiotic medicine, take it as told by  your health care provider. Do not stop taking the antibiotic even if your condition improves. Skin care  If you have open wounds, cover them with a clean dressing as told by your health care provider. Keep wounds clean by washing them gently with soap and water when you  bathe.  Do not shave the areas where you get hidradenitis suppurativa.  Do not wear deodorant.  Wear loose-fitting clothes.  Try to avoid getting overheated or sweaty. If you get sweaty or wet, change into clean, dry clothes as soon as you can.  To help relieve pain and itchiness, cover sore areas with a warm, clean washcloth (warm compress) for 5-10 minutes as often as needed.  If told by your health care provider, take a bleach bath twice a week: ? Fill your bathtub halfway with water. ? Pour in  cup of unscented household bleach. ? Soak in the tub for 5-10 minutes. ? Only soak from the neck down. Avoid water on your face and hair. ? Shower to rinse off the bleach from your skin. General instructions  Learn as much as you can about your disease so that you have an active role in your treatment. Work closely with your health care provider to find treatments that work for you.  If you are overweight, work with your health care provider to lose weight as recommended.  Do not use any products that contain nicotine or tobacco, such as cigarettes and e-cigarettes. If you need help quitting, ask your health care provider.  If you struggle with living with this condition, talk with your health care provider or work with a mental health care provider as recommended.  Keep all follow-up visits as told by your health care provider. This is important. Where to find more information  Hidradenitis Wilson.: https://www.hs-foundation.org/ Contact a health care provider if you have:  A flare-up of hidradenitis suppurativa.  A fever or chills.  Trouble controlling your symptoms at home.  Trouble doing your daily activities because of your symptoms.  Trouble dealing with emotional problems related to your condition. Summary  Hidradenitis suppurativa is a long-term (chronic) skin disease. It is similar to a severe form of acne, but it affects areas of the body where  acne would be unusual.  The first symptoms are usually painful bumps in the skin, similar to pimples. The condition may get worse over time (progress), or it may only cause mild symptoms.  If you have open wounds, cover them with a clean dressing as told by your health care provider. Keep wounds clean by washing them gently with soap and water when you bathe.  Besides skin care, treatment may include medicines, laser treatment, and surgery. This information is not intended to replace advice given to you by your health care provider. Make sure you discuss any questions you have with your health care provider. Document Released: 12/31/2003 Document Revised: 05/26/2017 Document Reviewed: 05/26/2017 Elsevier Patient Education  2020 [employer].

## 2019-03-04 LAB — CBC WITH DIFFERENTIAL/PLATELET
Basophils Absolute: 0 10*3/uL (ref 0.0–0.2)
Basos: 1 %
EOS (ABSOLUTE): 0.4 10*3/uL (ref 0.0–0.4)
Eos: 6 %
Hematocrit: 33 % — ABNORMAL LOW (ref 34.0–46.6)
Hemoglobin: 9.6 g/dL — ABNORMAL LOW (ref 11.1–15.9)
Immature Grans (Abs): 0 10*3/uL (ref 0.0–0.1)
Immature Granulocytes: 0 %
Lymphocytes Absolute: 2.2 10*3/uL (ref 0.7–3.1)
Lymphs: 33 %
MCH: 22.8 pg — ABNORMAL LOW (ref 26.6–33.0)
MCHC: 29.1 g/dL — ABNORMAL LOW (ref 31.5–35.7)
MCV: 78 fL — ABNORMAL LOW (ref 79–97)
Monocytes Absolute: 0.4 10*3/uL (ref 0.1–0.9)
Monocytes: 7 %
Neutrophils Absolute: 3.6 10*3/uL (ref 1.4–7.0)
Neutrophils: 53 %
Platelets: 427 10*3/uL (ref 150–450)
RBC: 4.21 x10E6/uL (ref 3.77–5.28)
RDW: 21.5 % — ABNORMAL HIGH (ref 11.7–15.4)
WBC: 6.7 10*3/uL (ref 3.4–10.8)

## 2019-03-04 LAB — LIPID PANEL W/O CHOL/HDL RATIO
Cholesterol, Total: 192 mg/dL (ref 100–199)
HDL: 37 mg/dL — ABNORMAL LOW (ref >39)
LDL Chol Calc (NIH): 131 mg/dL — ABNORMAL HIGH (ref 0–99)
Triglycerides: 133 mg/dL (ref 0–149)
VLDL Cholesterol Cal: 24 mg/dL (ref 5–40)

## 2019-03-04 LAB — COMPREHENSIVE METABOLIC PANEL
ALT: 13 IU/L (ref 0–32)
AST: 15 IU/L (ref 0–40)
Albumin/Globulin Ratio: 1.2 (ref 1.2–2.2)
Albumin: 4.1 g/dL (ref 3.8–4.8)
Alkaline Phosphatase: 59 IU/L (ref 39–117)
BUN/Creatinine Ratio: 19 (ref 9–23)
BUN: 16 mg/dL (ref 6–20)
Bilirubin Total: <0.2 mg/dL (ref 0.0–1.2)
CO2: 26 mmol/L (ref 20–29)
Calcium: 9.4 mg/dL (ref 8.7–10.2)
Chloride: 97 mmol/L (ref 96–106)
Creatinine, Ser: 0.84 mg/dL (ref 0.57–1.00)
GFR calc Af Amer: 103 mL/min/{1.73_m2} (ref >59)
GFR calc non Af Amer: 90 mL/min/{1.73_m2} (ref >59)
Globulin, Total: 3.3 g/dL (ref 1.5–4.5)
Glucose: 86 mg/dL (ref 65–99)
Potassium: 4.4 mmol/L (ref 3.5–5.2)
Sodium: 138 mmol/L (ref 134–144)
Total Protein: 7.4 g/dL (ref 6.0–8.5)

## 2019-03-04 LAB — TSH: TSH: 2.9 u[IU]/mL (ref 0.450–4.500)

## 2019-03-08 DIAGNOSIS — D509 Iron deficiency anemia, unspecified: Secondary | ICD-10-CM | POA: Insufficient documentation

## 2019-03-08 NOTE — Assessment & Plan Note (Signed)
BPs stable and WNL, continue current regimen. Continue working on [employer], weight loss

## 2019-03-08 NOTE — Assessment & Plan Note (Signed)
Will recheck CBC and adjust as needed. Has symptomatically improved on OTC supplementation. Continue working on increasing good dietary intake of iron

## 2019-04-17 ENCOUNTER — Telehealth: Payer: Self-pay | Admitting: Family Medicine

## 2019-04-17 NOTE — Telephone Encounter (Signed)
  Requesting Flagyl- Patient stated vaginal odor came back and would like a callback once medication has been sent to pharmacy.)

## 2019-04-17 NOTE — Telephone Encounter (Signed)
Copied from [place] [id number]. Topic: Quick Communication - Rx Refill/Question >> Apr 17, 2019  9:56 AM Rainey Pines A wrote: Medication: metroNIDAZOLE (FLAGYL) 500 MG tablet (Patient stated vaginal odor came back and would like a callback once medication has been sent to pharmacy.)  Has the patient contacted their pharmacy? {Yes (Agent: If no, request that the patient contact the pharmacy for the refill.) (Agent: If yes, when and what did the pharmacy advise?)Contact PCP  Preferred Pharmacy (with phone number or street name): [id number] - [place], [address] [place] [phone] (Phone) [phone] (Fax)    Agent: Please be advised that RX refills may take up to 3 business days. We ask that you follow-up with your pharmacy.

## 2019-04-18 NOTE — Telephone Encounter (Signed)
Called pt scheduled for tomorrow °

## 2019-04-18 NOTE — Telephone Encounter (Signed)
Needs appt, looks like she hasn't been seen for BV since August and will need to be re-tested

## 2019-04-19 ENCOUNTER — Encounter: Payer: Self-pay | Admitting: Family Medicine

## 2019-04-19 ENCOUNTER — Ambulatory Visit (INDEPENDENT_AMBULATORY_CARE_PROVIDER_SITE_OTHER): Payer: Managed Care, Other (non HMO) | Admitting: Family Medicine

## 2019-04-19 ENCOUNTER — Other Ambulatory Visit: Payer: Self-pay

## 2019-04-19 VITALS — BP 141/82 | HR 58 | Temp 98.4°F | Ht 66.7 in | Wt 338.0 lb

## 2019-04-19 DIAGNOSIS — N760 Acute vaginitis: Secondary | ICD-10-CM | POA: Diagnosis not present

## 2019-04-19 DIAGNOSIS — B9689 Other specified bacterial agents as the cause of diseases classified elsewhere: Secondary | ICD-10-CM

## 2019-04-19 LAB — WET PREP FOR TRICH, YEAST, CLUE
Clue Cell Exam: POSITIVE — AB
Trichomonas Exam: NEGATIVE
Yeast Exam: NEGATIVE

## 2019-04-19 MED ORDER — CLINDAMYCIN HCL 150 MG PO CAPS: 150.0000 mg | ORAL_CAPSULE | Freq: Two times a day (BID) | ORAL | 0 refills | Status: DC

## 2019-04-19 NOTE — Progress Notes (Signed)
BP (!) 141/82   Pulse (!) 58   Temp 98.4 F (36.9 C) (Oral)   Ht 5' 6.7" (1.694 m)   Wt (!) 338 lb (153.3 kg)   SpO2 100%   BMI 53.42 kg/m    Subjective:    Patient ID: Nancy Garza, female    DOB: 12-06-1980, 38 y.o.   MRN: [id number]  HPI: Nancy Garza is a 38 y.o. female  Chief Complaint  Patient presents with  . Vaginal problem    pt states that she has had vaginal odor when she has sexual intercourse since last Sunday   Vaginal odor and discharge x 4 days. Seems to have flared up after having intercourse at that time. Recent hx of BV infection, states this feels similar. Denies dysuria, rashes, concern for STIs, abdominal pain, fevers. Not trying anything OTC for sxs.   Relevant past medical, surgical, family and social history reviewed and updated as indicated. Interim medical history since our last visit reviewed. Allergies and medications reviewed and updated.  Review of Systems  Per HPI unless specifically indicated above     Objective:    BP (!) 141/82   Pulse (!) 58   Temp 98.4 F (36.9 C) (Oral)   Ht 5' 6.7" (1.694 m)   Wt (!) 338 lb (153.3 kg)   SpO2 100%   BMI 53.42 kg/m   Wt Readings from Last 3 Encounters:  04/19/19 (!) 338 lb (153.3 kg)  03/03/19 (!) 344 lb (156 kg)  01/24/19 (!) 343 lb (155.6 kg)    Physical Exam Vitals signs and nursing note reviewed.  Constitutional:      Appearance: Normal appearance. She is not ill-appearing.  HENT:     Head: Atraumatic.  Eyes:     Extraocular Movements: Extraocular movements intact.     Conjunctiva/sclera: Conjunctivae normal.  Neck:     Musculoskeletal: Normal range of motion and neck supple.  Cardiovascular:     Rate and Rhythm: Normal rate and regular rhythm.     Heart sounds: Normal heart sounds.  Pulmonary:     Effort: Pulmonary effort is normal.     Breath sounds: Normal breath sounds.  Abdominal:     General: Bowel sounds are normal.     Palpations: Abdomen is soft.   Tenderness: There is no abdominal tenderness. There is no right CVA tenderness, left CVA tenderness or guarding.  Genitourinary:    Vagina: Vaginal discharge present.  Musculoskeletal: Normal range of motion.  Skin:    General: Skin is warm and dry.  Neurological:     Mental Status: She is alert and oriented to person, place, and time.  Psychiatric:        Mood and Affect: Mood normal.        Thought Content: Thought content normal.        Judgment: Judgment normal.     Results for orders placed or performed in visit on 04/19/19  WET PREP FOR TRICH, YEAST, CLUE   Specimen: Vaginal Fluid   VAGINAL FLUI  Result Value Ref Range   Trichomonas Exam Negative Negative   Yeast Exam Negative Negative   Clue Cell Exam Positive (A) Negative      Assessment & Plan:   Problem List Items Addressed This Visit    None    Visit Diagnoses    BV (bacterial vaginosis)    -  Primary   Will tx with clindamycin, probiotics, continued good vaginal hygiene. F/u if not improving  Relevant Medications   clindamycin (CLEOCIN) 150 MG capsule   Other Relevant Orders   WET PREP FOR [place], YEAST, CLUE (Completed)       Follow up plan: Return if symptoms worsen or fail to improve.

## 2019-05-04 ENCOUNTER — Other Ambulatory Visit: Payer: Self-pay | Admitting: Family Medicine

## 2019-05-04 MED ORDER — AMLODIPINE BESYLATE 5 MG PO TABS: 5.0000 mg | ORAL_TABLET | Freq: Every day | ORAL | 0 refills | Status: DC

## 2019-05-04 MED ORDER — CHLORTHALIDONE 25 MG PO TABS: 25.0000 mg | ORAL_TABLET | Freq: Every day | ORAL | 0 refills | Status: DC

## 2019-05-04 NOTE — Telephone Encounter (Signed)
Pt states her insurance is requesting a 90 day in order to fill her Rx.  Pt request refill , 90 days  amLODipine (NORVASC) 5 MG tablet   chlorthalidone (HYGROTON) 25 MG tablet   Pt would also like a refill of the clindamycin (CLEOCIN) 150 MG capsule  Pt states the vaginal infection is coming back.  [facility] [place], [place] [facility] [address] Shari Prows [phone] (Phone) [phone] (Fax)

## 2019-05-04 NOTE — Telephone Encounter (Signed)
Requested medication (s) are due for refill today: no  Requested medication (s) are on the active medication list: yes  Last refill:  04/19/2019  Future visit scheduled: no  Notes to clinic:  Review for refill   Requested Prescriptions  Pending Prescriptions Disp Refills   clindamycin (CLEOCIN) 150 MG capsule 14 capsule 0    Sig: Take 1 capsule (150 mg total) by mouth 2 (two) times daily.     Off-Protocol Failed - 05/04/2019  1:17 PM      Failed - Medication not assigned to a protocol, review manually.      Passed - Valid encounter within last 12 months    Recent Outpatient Visits          2 weeks ago BV (bacterial vaginosis)   [facility] Particia Nearing, [place]   2 months ago Annual physical exam   [facility] Particia Nearing, [place]   3 months ago Vaginal odor   [facility] [place], [place], [place]   4 months ago Benign hypertension   [facility] Roosvelt Maser [place], [place]   6 months ago Benign hypertension   [facility] Roosvelt Maser [place], [place]             Signed Prescriptions Disp Refills   chlorthalidone (HYGROTON) 25 MG tablet 90 tablet 0    Sig: Take 1 tablet (25 mg total) by mouth daily.     Cardiovascular: Diuretics - Thiazide Failed - 05/04/2019  1:17 PM      Failed - Last BP in normal range    BP Readings from Last 1 Encounters:  04/19/19 (!) 141/82         Passed - Ca in normal range and within 360 days    Calcium  Date Value Ref Range Status  03/03/2019 9.4 8.7 - 10.2 mg/dL Final         Passed - Cr in normal range and within 360 days    Creatinine, Ser  Date Value Ref Range Status  03/03/2019 0.84 0.57 - 1.00 mg/dL Final         Passed - K in normal range and within 360 days    Potassium  Date Value Ref Range Status  03/03/2019 4.4 3.5 - 5.2 mmol/L Final         Passed - Na in normal range and within 360 days    Sodium  Date Value Ref Range Status   03/03/2019 138 134 - 144 mmol/L Final         Passed - Valid encounter within last 6 months    Recent Outpatient Visits          2 weeks ago BV (bacterial vaginosis)   [facility] Particia Nearing, [place]   2 months ago Annual physical exam   [facility] Particia Nearing, [place]   3 months ago Vaginal odor   [facility] [place], [place], [place]   4 months ago Benign hypertension   [facility] Roosvelt Maser [place], [place]   6 months ago Benign hypertension   [facility] Particia Nearing, [place]              amLODipine (NORVASC) 5 MG tablet 90 tablet 0    Sig: Take 1 tablet (5 mg total) by mouth daily. KEEP UPCOMING APPT     Cardiovascular:  Calcium Channel Blockers Failed - 05/04/2019  1:17 PM      Failed -  Last BP in normal range    BP Readings from Last 1 Encounters:  04/19/19 (!) 141/82         Passed - Valid encounter within last 6 months    Recent Outpatient Visits          2 weeks ago BV (bacterial vaginosis)   [facility] Volney American, [place]   2 months ago Annual physical exam   [facility] Volney American, [place]   3 months ago Vaginal odor   [place], [place], [place]   4 months ago Benign hypertension   [place], [place], [place]   6 months ago Benign hypertension   [place], [place], [place]

## 2019-05-04 NOTE — Telephone Encounter (Signed)
Routing to provider  

## 2019-06-05 ENCOUNTER — Other Ambulatory Visit: Payer: Self-pay | Admitting: Family Medicine

## 2019-06-05 MED ORDER — CHLORTHALIDONE 25 MG PO TABS: 25.0000 mg | ORAL_TABLET | Freq: Every day | ORAL | 0 refills | Status: DC

## 2019-06-05 NOTE — Telephone Encounter (Signed)
Requested medication (s) are due for refill today: no  Requested medication (s) are on the active medication list: yes  Last refill:  04/19/2019  Future visit scheduled: no  Notes to clinic:    Medication not assigned to a protocol, review manually    Requested Prescriptions  Pending Prescriptions Disp Refills   clindamycin (CLEOCIN) 150 MG capsule 14 capsule 0    Sig: Take 1 capsule (150 mg total) by mouth 2 (two) times daily.      Off-Protocol Failed - 06/05/2019  9:47 AM      Failed - Medication not assigned to a protocol, review manually.      Passed - Valid encounter within last 12 months    Recent Outpatient Visits           1 month ago BV (bacterial vaginosis)   [facility] Particia Nearing, [place]   3 months ago Annual physical exam   [facility] Particia N[facility], [place]   4 months ago Vaginal odor   [facility] [place], [place], [place]   5 months ago Benign hypertension   [facility] Roosvelt Maser [place], [place]   7 months ago Benign hypertension   [facility] Roosvelt Maser [place], [place]               Signed Prescriptions Disp Refills   chlorthalidone (HYGROTON) 25 MG tablet 90 tablet 0    Sig: Take 1 tablet (25 mg total) by mouth daily.      Cardiovascular: Diuretics - Thiazide Failed - 06/05/2019  9:47 AM      Failed - Last BP in normal range    BP Readings from Last 1 Encounters:  04/19/19 (!) 141/82          Passed - Ca in normal range and within 360 days    Calcium  Date Value Ref Range Status  03/03/2019 9.4 8.7 - 10.2 mg/dL Final          Passed - Cr in normal range and within 360 days    Creatinine, Ser  Date Value Ref Range Status  03/03/2019 0.84 0.57 - 1.00 mg/dL Final          Passed - K in normal range and within 360 days    Potassium  Date Value Ref Range Status  03/03/2019 4.4 3.5 - 5.2 mmol/L Final          Passed - Na in normal range and within 360  days    Sodium  Date Value Ref Range Status  03/03/2019 138 134 - 144 mmol/L Final          Passed - Valid encounter within last 6 months    Recent Outpatient Visits           1 month ago BV (bacterial vaginosis)   [facility] Particia Nearing, [place]   3 months ago Annual physical exam   [facility] Roosvelt Maser [place], [place]   4 months ago Vaginal odor   [facility] [place], [place], [place]   5 months ago Benign hypertension   [facility] Roosvelt Maser [place], [place]   7 months ago Benign hypertension   [facility] Roosvelt Maser [place], [place]

## 2019-06-05 NOTE — Telephone Encounter (Signed)
Medication Refill - Medication:  chlorthalidone (HYGROTON) 25 MG tablet  clindamycin (CLEOCIN) 150 MG capsule  Has the patient contacted their pharmacy? Yes advised to call office. Pt is needing 90 day supply for 25mg , as pharmacy is not giving her 30 anymore.   Preferred Pharmacy (with phone number or street name):  [facility] - [id number],  - [address] AT [facility] [address] [facility] Phone:  [phone]  Fax:  [phone]     Agent: Please be advised that RX refills may take up to 3 business days. We ask that you follow-up with your pharmacy.

## 2019-06-12 ENCOUNTER — Other Ambulatory Visit: Payer: Self-pay

## 2019-06-12 NOTE — Telephone Encounter (Signed)
Patient's last regular appointment was 03/03/19

## 2019-06-13 MED ORDER — METOPROLOL SUCCINATE ER 100 MG PO TB24: 100.0000 mg | ORAL_TABLET | Freq: Every day | ORAL | 0 refills | Status: DC

## 2019-06-22 ENCOUNTER — Telehealth: Payer: Self-pay | Admitting: Family Medicine

## 2019-06-22 NOTE — Telephone Encounter (Signed)
Will discuss tomorrow at appt  Copied from CRM [id number]. Topic: General - Other >> Jun 22, 2019  2:23 PM Tamela Oddi wrote: Reason for CRM: Patient would like advice after testing positive for COVID.  CB# [phone] >> Jun 22, 2019  3:05 PM Harriet Pho wrote: Scheduled virtual for tomorrow

## 2019-06-23 ENCOUNTER — Ambulatory Visit (INDEPENDENT_AMBULATORY_CARE_PROVIDER_SITE_OTHER): Payer: Managed Care, Other (non HMO) | Admitting: Family Medicine

## 2019-06-23 ENCOUNTER — Encounter: Payer: Self-pay | Admitting: Family Medicine

## 2019-06-23 ENCOUNTER — Other Ambulatory Visit: Payer: Self-pay

## 2019-06-23 VITALS — BP 137/90 | Temp 97.3°F | Ht 67.0 in | Wt 340.0 lb

## 2019-06-23 DIAGNOSIS — U071 COVID-19: Secondary | ICD-10-CM | POA: Diagnosis not present

## 2019-06-23 NOTE — Progress Notes (Signed)
BP 137/90   Temp (!) 97.3 F (36.3 C) (Temporal)   Ht 5\' 7"  (1.702 m)   Wt (!) 340 lb (154.2 kg)   SpO2 98%   BMI 53.25 kg/m    Subjective:    Patient ID: , female    DOB: Jan 08, 1981, 39 y.o.   MRN: [id number]  HPI: Nancy Garza is a 39 y.o. female  Chief Complaint  Patient presents with  . Covid 19    positive covid test, per patient  . Fatigue  . Headache    . This visit was completed via WebEx due to the restrictions of the COVID-19 pandemic. All issues as above were discussed and addressed. Physical exam was done as above through visual confirmation on WebEx. If it was felt that the patient should be evaluated in the office, they were directed there. The patient verbally consented to this visit. . Location of the patient: home . Location of the provider: work . Those involved with this call:  . Provider: 30, PA-C . CMA: Roosvelt Maser, CMA . Front Desk/Registration: Elton Sin  . Time spent on call: 15 minutes with patient face to face via video conference. More than 50% of this time was spent in counseling and coordination of care. 5 minutes total spent in review of patient's record and preparation of their chart. I verified patient identity using two factors (patient name and date of birth). Patient consents verbally to being seen via telemedicine visit today.   Started with fatigue, headaches, loss of taste, weakness several days ago. Got tested 2 days ago. This was also her first missed day of work. On isolation x 2 weeks now. Denies fever, chills, CP, SOB, n/v/d. Not currently taking anything for sxs, just trying to rest and stay hydrated.   Relevant past medical, surgical, family and social history reviewed and updated as indicated. Interim medical history since our last visit reviewed. Allergies and medications reviewed and updated.  Review of Systems  Per HPI unless specifically indicated above     Objective:    BP 137/90    Temp (!) 97.3 F (36.3 C) (Temporal)   Ht 5\' 7"  (1.702 m)   Wt (!) 340 lb (154.2 kg)   SpO2 98%   BMI 53.25 kg/m   Wt Readings from Last 3 Encounters:  06/23/19 (!) 340 lb (154.2 kg)  04/19/19 (!) 338 lb (153.3 kg)  03/03/19 (!) 344 lb (156 kg)    Physical Exam Vitals and nursing note reviewed.  Constitutional:      General: She is not in acute distress.    Appearance: Normal appearance.  HENT:     Head: Atraumatic.     Right Ear: External ear normal.     Left Ear: External ear normal.     Nose: Nose normal. No congestion.     Mouth/Throat:     Mouth: Mucous membranes are moist.     Pharynx: Oropharynx is clear. No posterior oropharyngeal erythema.  Eyes:     Extraocular Movements: Extraocular movements intact.     Conjunctiva/sclera: Conjunctivae normal.  Cardiovascular:     Comments: Unable to assess via virtual visit Pulmonary:     Effort: Pulmonary effort is normal. No respiratory distress.  Musculoskeletal:        General: Normal range of motion.     Cervical back: Normal range of motion.  Skin:    General: Skin is dry.     Findings: No erythema.  Neurological:  Mental Status: She is alert and oriented to person, place, and time.  Psychiatric:        Mood and Affect: Mood normal.        Thought Content: Thought content normal.        Judgment: Judgment normal.     Results for orders placed or performed in visit on 04/19/19  WET PREP FOR [place], YEAST, CLUE   Specimen: Vaginal Fluid   VAGINAL FLUI  Result Value Ref Range   Trichomonas Exam Negative Negative   Yeast Exam Negative Negative   Clue Cell Exam Positive (A) Negative      Assessment & Plan:   Problem List Items Addressed This Visit    None    Visit Diagnoses    COVID-19 virus infection    -  Primary   Continue quarantine based on CDC guidelines. Currently mildly symptomatic. Supportive care and strict return precautions reviewed       Follow up plan: Return if symptoms worsen or fail  to improve.

## 2019-07-04 ENCOUNTER — Telehealth: Payer: Self-pay

## 2019-07-04 NOTE — Telephone Encounter (Signed)
Routing to provider  

## 2019-07-04 NOTE — Telephone Encounter (Signed)
Copied from CRM [id number]. Topic: General - Other >> Jul 04, 2019 10:58 AM Dalphine Handing A wrote: Patient would like documentation ordering her back to work for Monday feb 8th. Patient stated that she still doesn't feel well and ready to go back to work on the date initially discussed. Please advise   Routing to provider. Can this be done?

## 2019-07-04 NOTE — Telephone Encounter (Signed)
Pt called to give Fax#: [phone]. Please advise.

## 2019-07-05 NOTE — Telephone Encounter (Signed)
OK to generate letter and fax

## 2019-07-06 NOTE — Telephone Encounter (Signed)
Letter printed and faxed

## 2019-07-06 NOTE — Telephone Encounter (Signed)
Letter generated and may be on the back printer. Can someone check the printer and if it did not print, please print and fax for the patient.

## 2019-07-12 ENCOUNTER — Other Ambulatory Visit: Payer: Self-pay | Admitting: Family Medicine

## 2019-08-14 ENCOUNTER — Other Ambulatory Visit: Payer: Self-pay | Admitting: Family Medicine

## 2019-08-14 ENCOUNTER — Other Ambulatory Visit: Payer: Self-pay

## 2019-08-14 MED ORDER — AMLODIPINE BESYLATE 5 MG PO TABS: 5.0000 mg | ORAL_TABLET | Freq: Every day | ORAL | 0 refills | Status: DC

## 2019-08-14 NOTE — Telephone Encounter (Signed)
Has not had a regular follow up. Needs regular follow up.

## 2019-08-14 NOTE — Telephone Encounter (Signed)
LOV 05/02/2020

## 2019-08-23 ENCOUNTER — Telehealth (INDEPENDENT_AMBULATORY_CARE_PROVIDER_SITE_OTHER): Payer: Managed Care, Other (non HMO) | Admitting: Family Medicine

## 2019-08-23 ENCOUNTER — Encounter: Payer: Self-pay | Admitting: Family Medicine

## 2019-08-23 VITALS — BP 142/96 | HR 64 | Temp 97.4°F | Ht 67.0 in | Wt 350.0 lb

## 2019-08-23 DIAGNOSIS — E7800 Pure hypercholesterolemia, unspecified: Secondary | ICD-10-CM | POA: Diagnosis not present

## 2019-08-23 DIAGNOSIS — I10 Essential (primary) hypertension: Secondary | ICD-10-CM

## 2019-08-23 DIAGNOSIS — E785 Hyperlipidemia, unspecified: Secondary | ICD-10-CM | POA: Insufficient documentation

## 2019-08-23 MED ORDER — CHLORTHALIDONE 25 MG PO TABS: 25.0000 mg | ORAL_TABLET | Freq: Every day | ORAL | 1 refills | Status: DC

## 2019-08-23 MED ORDER — METOPROLOL SUCCINATE ER 100 MG PO TB24: 100.0000 mg | ORAL_TABLET | Freq: Every day | ORAL | 1 refills | Status: DC

## 2019-08-23 MED ORDER — AMLODIPINE BESYLATE 5 MG PO TABS: 5.0000 mg | ORAL_TABLET | Freq: Every day | ORAL | 1 refills | Status: DC

## 2019-08-23 NOTE — Assessment & Plan Note (Signed)
Elevated currently, but out of amlodipine. Will refill medications and continue to monitor. Continue current regimen

## 2019-08-23 NOTE — Assessment & Plan Note (Signed)
Discussed several weight loss medication options, pt opting to try working on diet and exercise instead at this time. Continue to monitor closely

## 2019-08-23 NOTE — Progress Notes (Signed)
BP (!) 142/96   Pulse 64   Temp (!) 97.4 F (36.3 C) (Oral)   Ht 5\' 7"  (1.702 m)   Wt (!) 350 lb (158.8 kg)   BMI 54.82 kg/m    Subjective:    Patient ID: , female    DOB: Oct 15, 1980, 39 y.o.   MRN: [id number]  HPI: Nancy Garza is a 39 y.o. female  Chief Complaint  Patient presents with  . Hypertension  . Medication Refill    . This visit was completed via MyChart due to the restrictions of the COVID-19 pandemic. All issues as above were discussed and addressed. Physical exam was done as above through visual confirmation on MyChart. If it was felt that the patient should be evaluated in the office, they were directed there. The patient verbally consented to this visit. . Location of the patient: home . Location of the provider: work . Those involved with this call:  . Provider: 30, PA-C . CMA: Roosvelt Maser, CMA . Front Desk/Registration: Elton Sin  . Time spent on call: 15 minutes with patient face to face via video conference. More than 50% of this time was spent in counseling and coordination of care. 5 minutes total spent in review of patient's record and preparation of their chart. I verified patient identity using two factors (patient name and date of birth). Patient consents verbally to being seen via telemedicine visit today.   Presenting today for HTN f/u. Has been out of amlodipine for 1 week, still on chlorthalidone and metoprolol. Home readings were running 130s/70s when on both medications. Denies side effects, CP, SOB, HAs, dizziness. Leg edema still doing much better now that on chlorthalidone. Has not been exercising or dieting lately, wanting to start back. Upset about her weight and wanting to discuss options.   Relevant past medical, surgical, family and social history reviewed and updated as indicated. Interim medical history since our last visit reviewed. Allergies and medications reviewed and updated.  Review of Systems   Per HPI unless specifically indicated above     Objective:    BP (!) 142/96   Pulse 64   Temp (!) 97.4 F (36.3 C) (Oral)   Ht 5\' 7"  (1.702 m)   Wt (!) 350 lb (158.8 kg)   BMI 54.82 kg/m   Wt Readings from Last 3 Encounters:  08/23/19 (!) 350 lb (158.8 kg)  06/23/19 (!) 340 lb (154.2 kg)  04/19/19 (!) 338 lb (153.3 kg)    Physical Exam Vitals and nursing note reviewed.  Constitutional:      General: She is not in acute distress.    Appearance: Normal appearance.  HENT:     Head: Atraumatic.     Right Ear: External ear normal.     Left Ear: External ear normal.     Nose: Nose normal. No congestion.     Mouth/Throat:     Mouth: Mucous membranes are moist.     Pharynx: Oropharynx is clear. No posterior oropharyngeal erythema.  Eyes:     Extraocular Movements: Extraocular movements intact.     Conjunctiva/sclera: Conjunctivae normal.  Cardiovascular:     Comments: Unable to assess via virtual visit Pulmonary:     Effort: Pulmonary effort is normal. No respiratory distress.  Musculoskeletal:        General: Normal range of motion.     Cervical back: Normal range of motion.  Skin:    General: Skin is dry.     Findings:  No erythema.  Neurological:     Mental Status: She is alert and oriented to person, place, and time.  Psychiatric:        Mood and Affect: Mood normal.        Thought Content: Thought content normal.        Judgment: Judgment normal.     Results for orders placed or performed in visit on 04/19/19  WET PREP FOR [place], YEAST, CLUE   Specimen: Vaginal Fluid   VAGINAL FLUI  Result Value Ref Range   Trichomonas Exam Negative Negative   Yeast Exam Negative Negative   Clue Cell Exam Positive (A) Negative      Assessment & Plan:   Problem List Items Addressed This Visit      Cardiovascular and Mediastinum   Benign hypertension - Primary    Elevated currently, but out of amlodipine. Will refill medications and continue to monitor. Continue  current regimen      Relevant Medications   metoprolol succinate (TOPROL-XL) 100 MG 24 hr tablet   chlorthalidone (HYGROTON) 25 MG tablet   amLODipine (NORVASC) 5 MG tablet   Other Relevant Orders   Comprehensive metabolic panel     Other   Morbid obesity ([place])    Discussed several weight loss medication options, pt opting to try working on diet and exercise instead at this time. Continue to monitor closely      Hyperlipidemia    Recheck lipids, adjust as needed. Continue working on diet and exercise changes      Relevant Medications   metoprolol succinate (TOPROL-XL) 100 MG 24 hr tablet   chlorthalidone (HYGROTON) 25 MG tablet   amLODipine (NORVASC) 5 MG tablet   Other Relevant Orders   Lipid Panel w/o Chol/HDL Ratio       Follow up plan: Return in about 6 months (around 02/23/2020) for CPE.

## 2019-08-23 NOTE — Assessment & Plan Note (Signed)
Recheck lipids, adjust as needed. Continue working on diet and exercise changes 

## 2019-12-07 ENCOUNTER — Other Ambulatory Visit: Payer: Self-pay

## 2019-12-07 ENCOUNTER — Telehealth: Payer: Self-pay | Admitting: Family Medicine

## 2019-12-07 ENCOUNTER — Ambulatory Visit (INDEPENDENT_AMBULATORY_CARE_PROVIDER_SITE_OTHER): Payer: Managed Care, Other (non HMO) | Admitting: Family Medicine

## 2019-12-07 ENCOUNTER — Encounter: Payer: Self-pay | Admitting: Family Medicine

## 2019-12-07 VITALS — BP 140/96 | HR 66 | Temp 98.1°F | Wt 345.0 lb

## 2019-12-07 DIAGNOSIS — F419 Anxiety disorder, unspecified: Secondary | ICD-10-CM | POA: Diagnosis not present

## 2019-12-07 DIAGNOSIS — F329 Major depressive disorder, single episode, unspecified: Secondary | ICD-10-CM | POA: Diagnosis not present

## 2019-12-07 MED ORDER — SERTRALINE HCL 50 MG PO TABS: 50.0000 mg | ORAL_TABLET | Freq: Every day | ORAL | 0 refills | Status: DC

## 2019-12-07 NOTE — Progress Notes (Signed)
BP (!) 140/96   Pulse 66   Temp 98.1 F (36.7 C) (Oral)   Wt (!) 345 lb (156.5 kg)   BMI 54.03 kg/m    Subjective:    Patient ID: Nancy Garza, female    DOB: 12-15-1980, 39 y.o.   MRN: [id number]  HPI: Nancy Garza is a 39 y.o. female  Chief Complaint  Patient presents with  . Depression    pt states she has been very streesed out due to having to take care of her mom and every day life   . Anxiety   Patient presents today for concerns about anxiety, depression and feeling completely overwhelmed recently due to life circumstances. 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 Crying often, experiencing some anhedonia. Has been having trouble getting FMLA filed to cover for her caregiving duties on days she needs to miss work for this reason. Has never dealt with these issues to this extent in the past, never taken any medications for these types of things. 8 23rd going back the 26th  Depression screen [facility] 2/9 12/07/2019 03/03/2019 01/27/2017  Decreased Interest 3 0 0  Down, Depressed, Hopeless 3 0 0  PHQ - 2 Score 6 0 0  Altered sleeping 3 - -  Tired, decreased energy 3 - -  Change in appetite 3 - -  Feeling bad or failure about yourself  3 - -  Trouble concentrating 3 - -  Moving slowly or fidgety/restless 0 - -  Suicidal thoughts 0 - -  PHQ-9 Score 21 - -  Difficult doing work/chores Extremely dIfficult - -   GAD 7 : Generalized Anxiety Score 12/07/2019  Nervous, Anxious, on Edge 3  Control/stop worrying 3  Worry too much - different things 3  Trouble relaxing 3  Restless 0  Easily annoyed or irritable 3  Afraid - awful might happen 0  Total GAD 7 Score 15  Anxiety Difficulty Extremely difficult   Relevant past medical, surgical, family and social history reviewed and updated as indicated. Interim medical history since our last visit reviewed. Allergies and medications reviewed and updated.  Review of  Systems  Per HPI unless specifically indicated above     Objective:    BP (!) 140/96   Pulse 66   Temp 98.1 F (36.7 C) (Oral)   Wt (!) 345 lb (156.5 kg)   BMI 54.03 kg/m   Wt Readings from Last 3 Encounters:  12/07/19 (!) 345 lb (156.5 kg)  08/23/19 (!) 350 lb (158.8 kg)  06/23/19 (!) 340 lb (154.2 kg)    Physical Exam Vitals and nursing note reviewed.  Constitutional:      Appearance: Normal appearance. She is not ill-appearing.  HENT:     Head: Atraumatic.  Eyes:     Extraocular Movements: Extraocular movements intact.     Conjunctiva/sclera: Conjunctivae normal.  Cardiovascular:     Rate and Rhythm: Normal rate and regular rhythm.     Heart sounds: Normal heart sounds.  Pulmonary:     Effort: Pulmonary effort is normal.     Breath sounds: Normal breath sounds.  Musculoskeletal:        General: Normal range of motion.     Cervical back: Normal range of motion and neck supple.  Skin:    General: Skin is warm and dry.  Neurological:     Mental Status: She is alert and oriented to person, place, and time.  Psychiatric:        Thought Content: Thought content normal.        Judgment: Judgment normal.     Comments: Tearful, anxious     Results for orders placed or performed in visit on 04/19/19  WET PREP FOR [place], YEAST, CLUE   Specimen: Vaginal Fluid   VAGINAL FLUI  Result Value Ref Range   Trichomonas Exam Negative Negative   Yeast Exam Negative Negative   Clue Cell Exam Positive (A) Negative      Assessment & Plan:   Problem List Items Addressed This Visit    None    Visit Diagnoses    Anxiety and depression    -  Primary   Tx with low dose zoloft, list of counselors given. Will generate FMLA papers for 2 weeks continuous leave, from today - 7/23. F/u in 1 month   Relevant Medications   sertraline (ZOLOFT) 50 MG tablet      40 minutes spent today in direct patient care and counseling  Follow up plan: Return in about 4 weeks (around 01/04/2020)  for anxiety f/u.

## 2019-12-07 NOTE — Telephone Encounter (Signed)
Sending to [facility] for review.

## 2019-12-07 NOTE — Telephone Encounter (Signed)
Pt came to drop off FMLA forms that were talked about in today's visit. Forms placed in bin. Pt request phone call when done.

## 2019-12-12 ENCOUNTER — Telehealth: Payer: Self-pay

## 2019-12-12 NOTE — Telephone Encounter (Signed)
Patient notified of paperwork ready for pick up. Made copies to scan and originals placed in pick up box. Pt requested fax paperwork to the fax number below. Faxed over

## 2019-12-19 ENCOUNTER — Telehealth: Payer: Self-pay | Admitting: Family Medicine

## 2019-12-19 NOTE — Telephone Encounter (Signed)
Routing to provider to advise. Patient seen and started on Sertraline 12/07/19

## 2019-12-19 NOTE — Telephone Encounter (Signed)
Needs appt

## 2019-12-19 NOTE — Telephone Encounter (Signed)
sertraline (ZOLOFT) 50 MG tablet Medication Date: 12/07/2019 Department: [facility] Ordering/Authorizing: Particia Nearing, PA-C  Order Providers  Pt states this med is not working at all in fact she thinks it is making her worse. States she needs something different and that she feels really worse shape mentally than she was before and it is nnnnnnnnnnnnnnnnnnnnnot anyway she can return to work. Pt is requesting a FU from the nurse. CB at [phone]

## 2019-12-20 NOTE — Telephone Encounter (Signed)
Patient states that the current medication isn't working and she needs to speck to someone about the medication. She says she feels exactly the same, nothing has changed. Please advise

## 2019-12-20 NOTE — Telephone Encounter (Signed)
Also received paperwork from [facility] for patient to receive STD Benefits for her absence.  Filling out forms.   Routing to provider in regards to her Sertraline.

## 2019-12-20 NOTE — Telephone Encounter (Signed)
Form signed, has f/u scheduled tomorrow so will discuss then

## 2019-12-21 ENCOUNTER — Encounter: Payer: Self-pay | Admitting: Family Medicine

## 2019-12-21 ENCOUNTER — Telehealth (INDEPENDENT_AMBULATORY_CARE_PROVIDER_SITE_OTHER): Payer: Managed Care, Other (non HMO) | Admitting: Family Medicine

## 2019-12-21 VITALS — Wt 344.0 lb

## 2019-12-21 DIAGNOSIS — F329 Major depressive disorder, single episode, unspecified: Secondary | ICD-10-CM | POA: Diagnosis not present

## 2019-12-21 DIAGNOSIS — F419 Anxiety disorder, unspecified: Secondary | ICD-10-CM | POA: Diagnosis not present

## 2019-12-21 MED ORDER — VENLAFAXINE HCL ER 75 MG PO CP24: 75.0000 mg | ORAL_CAPSULE | Freq: Every day | ORAL | 0 refills | Status: DC

## 2019-12-21 NOTE — Progress Notes (Signed)
Wt (!) 344 lb (156 kg)   BMI 53.88 kg/m    Subjective:    Patient ID: Nancy Garza, female    DOB: [birth year], 39 y.o.   MRN: [id number]  HPI: Nancy Garza is a 39 y.o. female  Chief Complaint  Patient presents with  . Depression    discuss zoloft. stopped taking it since yesterday due to med not working well    . This visit was completed via MyChart due to the restrictions of the COVID-19 pandemic. All issues as above were discussed and addressed. Physical exam was done as above through visual confirmation on MyChart. If it was felt that the patient should be evaluated in the office, they were directed there. The patient verbally consented to this visit. . Location of the patient: home . Location of the provider: work . Those involved with this call:  . Provider: Roosvelt Maser, PA-C . CMA: Elton Sin, CMA . Front Desk/Registration: Adela Ports  . Time spent on call: 30 minutes with patient face to face via video conference. More than 50% of this time was spent in counseling and coordination of care. 5 minutes total spent in review of patient's record and preparation of their chart. I verified patient identity using two factors (patient name and date of birth). Patient consents verbally to being seen via telemedicine visit today.   Presenting today following up on anxiety and depression after starting zoloft 2 weeks ago. Concerned because she didn't see much of a difference so stopped it yesterday. Still overwhelmed constantly and often feeling panicked. Notes on the medication she seemed to lose all motivation to do anything and only wanted to lay around. When asked, she does state she had much less crying spells and lability while taking the medication. No other side effects noticed. Denies SI/HI. Has been on a 2 week FMLA leave for these issues from work which ends Monday and she does not feel ready to return. Would like another week. Has not yet gotten in with  counseling.   Depression screen [facility] 2/9 12/21/2019 12/07/2019 03/03/2019  Decreased Interest 3 3 0  Down, Depressed, Hopeless 3 3 0  PHQ - 2 Score 6 6 0  Altered sleeping 3 3 -  Tired, decreased energy 3 3 -  Change in appetite 3 3 -  Feeling bad or failure about yourself  2 3 -  Trouble concentrating 3 3 -  Moving slowly or fidgety/restless 1 0 -  Suicidal thoughts 1 0 -  PHQ-9 Score 22 21 -  Difficult doing work/chores Extremely dIfficult Extremely dIfficult -   GAD 7 : Generalized Anxiety Score 12/07/2019  Nervous, Anxious, on Edge 3  Control/stop worrying 3  Worry too much - different things 3  Trouble relaxing 3  Restless 0  Easily annoyed or irritable 3  Afraid - awful might happen 0  Total GAD 7 Score 15  Anxiety Difficulty Extremely difficult     Relevant past medical, surgical, family and social history reviewed and updated as indicated. Interim medical history since our last visit reviewed. Allergies and medications reviewed and updated.  Review of Systems  Per HPI unless specifically indicated above     Objective:    Wt (!) 344 lb (156 kg)   BMI 53.88 kg/m   Wt Readings from Last 3 Encounters:  12/21/19 (!) 344 lb (156 kg)  12/07/19 (!) 345 lb (156.5 kg)  08/23/19 (!) 350 lb (158.8 kg)    Physical Exam Vitals and  nursing note reviewed.  Constitutional:      General: She is not in acute distress.    Appearance: Normal appearance.  HENT:     Head: Atraumatic.     Right Ear: External ear normal.     Left Ear: External ear normal.     Nose: Nose normal. No congestion.     Mouth/Throat:     Mouth: Mucous membranes are moist.     Pharynx: Oropharynx is clear. No posterior oropharyngeal erythema.  Eyes:     Extraocular Movements: Extraocular movements intact.     Conjunctiva/sclera: Conjunctivae normal.  Cardiovascular:     Comments: Unable to assess via virtual visit Pulmonary:     Effort: Pulmonary effort is normal. No respiratory distress.    Musculoskeletal:        General: Normal range of motion.     Cervical back: Normal range of motion.  Skin:    General: Skin is dry.     Findings: No erythema.  Neurological:     Mental Status: She is alert and oriented to person, place, and time.  Psychiatric:        Mood and Affect: Mood normal.        Thought Content: Thought content normal.        Judgment: Judgment normal.     Results for orders placed or performed in visit on 04/19/19  WET PREP FOR TRICH, YEAST, CLUE   Specimen: Vaginal Fluid   VAGINAL FLUI  Result Value Ref Range   Trichomonas Exam Negative Negative   Yeast Exam Negative Negative   Clue Cell Exam Positive (A) Negative      Assessment & Plan:   Problem List Items Addressed This Visit      Other   Anxiety and depression - Primary    Subtle early benefit with the zoloft though felt lack of motivation while on it. Switch to effexor and discussed taking for at least a month to reach good amount of effect. Work on getting in with counseling. Will write letter for additional week of leave which would encompass all next week through 12/29/2019      Relevant Medications   venlafaxine XR (EFFEXOR XR) 75 MG 24 hr capsule       Follow up plan: Return in about 2 weeks (around 01/04/2020) for Mood and anxiety f/u.

## 2019-12-22 DIAGNOSIS — F329 Major depressive disorder, single episode, unspecified: Secondary | ICD-10-CM | POA: Insufficient documentation

## 2019-12-22 DIAGNOSIS — F419 Anxiety disorder, unspecified: Secondary | ICD-10-CM | POA: Insufficient documentation

## 2019-12-22 NOTE — Assessment & Plan Note (Signed)
Subtle early benefit with the zoloft though felt lack of motivation while on it. Switch to effexor and discussed taking for at least a month to reach good amount of effect. Work on getting in with counseling. Will write letter for additional week of leave which would encompass all next week through 12/29/2019

## 2020-01-03 ENCOUNTER — Ambulatory Visit: Payer: Managed Care, Other (non HMO) | Admitting: Family Medicine

## 2020-01-04 ENCOUNTER — Ambulatory Visit (INDEPENDENT_AMBULATORY_CARE_PROVIDER_SITE_OTHER): Payer: Managed Care, Other (non HMO) | Admitting: Unknown Physician Specialty

## 2020-01-04 ENCOUNTER — Other Ambulatory Visit: Payer: Self-pay

## 2020-01-04 ENCOUNTER — Encounter: Payer: Self-pay | Admitting: Unknown Physician Specialty

## 2020-01-04 VITALS — BP 145/91 | HR 66 | Temp 97.6°F | Ht 66.93 in | Wt 346.4 lb

## 2020-01-04 DIAGNOSIS — Z636 Dependent relative needing care at home: Secondary | ICD-10-CM | POA: Diagnosis not present

## 2020-01-04 DIAGNOSIS — F329 Major depressive disorder, single episode, unspecified: Secondary | ICD-10-CM | POA: Diagnosis not present

## 2020-01-04 DIAGNOSIS — F419 Anxiety disorder, unspecified: Secondary | ICD-10-CM

## 2020-01-04 MED ORDER — TRAZODONE HCL 50 MG PO TABS: 25.0000 mg | ORAL_TABLET | Freq: Every evening | ORAL | 3 refills | Status: DC | PRN

## 2020-01-04 NOTE — Progress Notes (Signed)
BP (!) 145/91 (BP Location: Left Arm, Patient Position: Sitting, Cuff Size: Normal)   Pulse 66   Temp 97.6 F (36.4 C) (Oral)   Ht 5' 6.93" (1.7 m)   Wt (!) 346 lb 6.4 oz (157.1 kg)   LMP 12/04/2019   SpO2 92%   BMI 54.37 kg/m    Subjective:    Patient ID: Nancy Garza, female    DOB: 27-May-1981, 39 y.o.   MRN: [id number]  HPI: Nancy Garza is a 39 y.o. female  Chief Complaint  Patient presents with  . Form Completion  . medication concerns   Pt started with Zoloft. Was not working well and is now taking Effexor. States she never felt like this before.  Pt is overwhelmed with care for her mother, going to school, and caring for her mother.     Depression screen [facility] 2/9 01/04/2020 12/21/2019 12/07/2019 03/03/2019 01/27/2017  Decreased Interest 3 3 3  0 0  Down, Depressed, Hopeless 3 3 3  0 0  PHQ - 2 Score 6 6 6  0 0  Altered sleeping 3 3 3  - -  Tired, decreased energy 3 3 3  - -  Change in appetite 3 3 3  - -  Feeling bad or failure about yourself  3 2 3  - -  Trouble concentrating 3 3 3  - -  Moving slowly or fidgety/restless 0 1 0 - -  Suicidal thoughts 0 1 0 - -  PHQ-9 Score 21 22 21  - -  Difficult doing work/chores - Extremely dIfficult Extremely dIfficult - -    Relevant past medical, surgical, family and social history reviewed and updated as indicated. Interim medical history since our last visit reviewed. Allergies and medications reviewed and updated.  Review of Systems  Per HPI unless specifically indicated above     Objective:    BP (!) 145/91 (BP Location: Left Arm, Patient Position: Sitting, Cuff Size: Normal)   Pulse 66   Temp 97.6 F (36.4 C) (Oral)   Ht 5' 6.93" (1.7 m)   Wt (!) 346 lb 6.4 oz (157.1 kg)   LMP 12/04/2019   SpO2 92%   BMI 54.37 kg/m   Wt Readings from Last 3 Encounters:  01/04/20 (!) 346 lb 6.4 oz (157.1 kg)  12/21/19 (!) 344 lb (156 kg)  12/07/19 (!) 345 lb (156.5 kg)    Physical Exam Constitutional:      General: She  is not in acute distress.    Appearance: Normal appearance. She is well-developed.  HENT:     Head: Normocephalic and atraumatic.  Eyes:     General: Lids are normal. No scleral icterus.       Right eye: No discharge.        Left eye: No discharge.     Conjunctiva/sclera: Conjunctivae normal.  Cardiovascular:     Rate and Rhythm: Normal rate.  Pulmonary:     Effort: Pulmonary effort is normal.  Abdominal:     Palpations: There is no hepatomegaly or splenomegaly.  Musculoskeletal:        General: Normal range of motion.  Skin:    Coloration: Skin is not pale.     Findings: No rash.  Neurological:     Mental Status: She is alert and oriented to person, place, and time.  Psychiatric:        Behavior: Behavior normal.        Thought Content: Thought content normal.        Judgment: Judgment normal.  Results for orders placed or performed in visit on 04/19/19  WET PREP FOR TRICH, YEAST, CLUE   Specimen: Vaginal Fluid   VAGINAL FLUI  Result Value Ref Range   Trichomonas Exam Negative Negative   Yeast Exam Negative Negative   Clue Cell Exam Positive (A) Negative      Assessment & Plan:   Problem List Items Addressed This Visit      Unprioritized   Anxiety and depression    Will continue Venlafaxine for now.  Will get employee assistance counseling.  Will refer to social work for financial assistance.  Start Trazadone 25-50 mg 1 hour before bed.        Relevant Medications   traZODone (DESYREL) 50 MG tablet   Other Relevant Orders   Referral to Chronic Care Management Services    Other Visit Diagnoses    Caregiver stress    -  Primary   Relevant Orders   Referral to Chronic Care Management Services       Follow up plan: Return in about 2 weeks (around 01/18/2020).

## 2020-01-04 NOTE — Assessment & Plan Note (Signed)
Will continue Venlafaxine for now.  Will get employee assistance counseling.  Will refer to social work for financial assistance.  Start Trazadone 25-50 mg 1 hour before bed.

## 2020-01-05 ENCOUNTER — Telehealth: Payer: Self-pay | Admitting: *Deleted

## 2020-01-05 NOTE — Chronic Care Management (AMB) (Signed)
  Care Management   Note  01/05/2020 Name: Nancy Garza MRN: [id number] DOB: [birth year]  Nancy Garza is a 39 y.o. year old female who is a primary care patient of Particia Nearing, [place]. I reached out to [employer] by phone today in response to a referral sent by [name]'s health plan.    [name] was given information about care management services today including:  1. Care management services include personalized support from designated clinical staff supervised by her physician, including individualized plan of care and coordination with other care providers 2. 24/7 contact phone numbers for assistance for urgent and routine care needs. 3. The patient may stop care management services at any time by phone call to the office staff.  Patient agreed to services and verbal consent obtained.   Follow up plan: Telephone appointment with care management team member scheduled for: 01/12/2020  [facility] Guide, Embedded Care Coordination [facility]  [place], [place] [address] Direct Dial: [phone] [email or web address]: [email or web address]

## 2020-01-09 ENCOUNTER — Ambulatory Visit: Payer: Managed Care, Other (non HMO) | Admitting: Family Medicine

## 2020-01-12 ENCOUNTER — Telehealth: Payer: Managed Care, Other (non HMO)

## 2020-01-12 ENCOUNTER — Telehealth: Payer: Self-pay | Admitting: Licensed Clinical Social Worker

## 2020-01-12 NOTE — Telephone Encounter (Signed)
  Chronic Care Management    Clinical Social Work General Follow Up Note  01/12/2020 Name: Nancy Garza MRN: [id number] DOB: [birth year]  Nancy Garza is a 39 y.o. year old female who is a primary care patient of Particia Nearing, [place]. The CCM team was consulted for assistance with [employer] .   Review of patient status, including review of consultants reports, relevant laboratory and other test results, and collaboration with appropriate care team members and the patient's provider was performed as part of comprehensive patient evaluation and provision of chronic care management services.    Outpatient Encounter Medications as of 01/12/2020  Medication Sig  . amLODipine (NORVASC) 5 MG tablet Take 1 tablet (5 mg total) by mouth daily. KEEP UPCOMING APPT  . chlorthalidone (HYGROTON) 25 MG tablet Take 1 tablet (25 mg total) by mouth daily.  . Cyanocobalamin (VITAMIN B-12 PO) Take by mouth daily.  . ferrous sulfate 324 (65 Fe) MG TBEC TAKE 1 TABLET BY MOUTH THREE TIMES DAILY  . metoprolol succinate (TOPROL-XL) 100 MG 24 hr tablet Take 1 tablet (100 mg total) by mouth daily. KEEP UPCOMING APPT  . traZODone (DESYREL) 50 MG tablet Take 0.5-1 tablets (25-50 mg total) by mouth at bedtime as needed for sleep.  Marland Kitchen venlafaxine XR (EFFEXOR XR) 75 MG 24 hr capsule Take 1 capsule (75 mg total) by mouth daily with breakfast.   No facility-administered encounter medications on file as of 01/12/2020.    LCSW completed CCM outreach attempt today but was unable to reach patient successfully. A HIPPA compliant voice message was left encouraging patient to return call once available. LCSW will reschedule CCM SW appointment as well.  Follow Up Plan: [facility] Guide will reach out to patient for assistance with scheduling future appointment with CCM LCSW.   Dickie La, BSW, MSW, LCSW [facility] Care Management [place]  [facility]  Network [email or web address] Phone: [phone]

## 2020-01-16 NOTE — Telephone Encounter (Signed)
Hey Nancy Garza   Spoke with Pt and she declined r/s appt and states that her mother now has a Child psychotherapist of her own and does not need assistance at this time   Thank you   Penne Lash, RMA Care Guide, Embedded Care Coordination [facility]  [place], [place] [address] Direct Dial: [phone] [email or web address]

## 2020-01-17 ENCOUNTER — Other Ambulatory Visit: Payer: Self-pay | Admitting: Family Medicine

## 2020-01-17 NOTE — Telephone Encounter (Signed)
Requested Prescriptions  Pending Prescriptions Disp Refills   venlafaxine XR (EFFEXOR-XR) 75 MG 24 hr capsule [Pharmacy Med Name: VENLAFAXINE ER 75MG  CAPSULES] 30 capsule 1    Sig: TAKE 1 CAPSULE(75 MG) BY MOUTH DAILY WITH BREAKFAST     Psychiatry: Antidepressants - SNRI - desvenlafaxine & venlafaxine Failed - 01/17/2020  3:20 AM      Failed - LDL in normal range and within 360 days    LDL Chol Calc (NIH)  Date Value Ref Range Status  03/03/2019 131 (H) 0 - 99 mg/dL Final         Failed - Last BP in normal range    BP Readings from Last 1 Encounters:  01/04/20 (!) 145/91         Passed - Total Cholesterol in normal range and within 360 days    Cholesterol, Total  Date Value Ref Range Status  03/03/2019 192 100 - 199 mg/dL Final         Passed - Triglycerides in normal range and within 360 days    Triglycerides  Date Value Ref Range Status  03/03/2019 133 0 - 149 mg/dL Final         Passed - Completed PHQ-2 or PHQ-9 in the last 360 days.      Passed - Valid encounter within last 6 months    Recent Outpatient Visits          1 week ago Caregiver stress   [facility], NP   3 weeks ago Anxiety and depression   [facility] [place]   1 month ago Anxiety and depression   [facility] [place]   4 months ago Benign hypertension   [facility] [place], [place]   6 months ago COVID-19 virus infection   [facility], [place]      Future Appointments            Tomorrow [place], NP [facility], [facility]   In 2 months [facility], NP [facility], [facility]           Per provider's note on 01/04/2020 - plan to continue Venlafaxine for now.

## 2020-01-18 ENCOUNTER — Ambulatory Visit: Payer: Managed Care, Other (non HMO) | Admitting: Unknown Physician Specialty

## 2020-01-19 ENCOUNTER — Other Ambulatory Visit: Payer: Self-pay | Admitting: Family Medicine

## 2020-01-23 ENCOUNTER — Ambulatory Visit (INDEPENDENT_AMBULATORY_CARE_PROVIDER_SITE_OTHER): Payer: Managed Care, Other (non HMO) | Admitting: Family Medicine

## 2020-01-23 ENCOUNTER — Encounter: Payer: Self-pay | Admitting: Family Medicine

## 2020-01-23 ENCOUNTER — Other Ambulatory Visit: Payer: Self-pay

## 2020-01-23 VITALS — BP 125/88 | HR 62 | Temp 98.1°F | Wt 344.0 lb

## 2020-01-23 DIAGNOSIS — F419 Anxiety disorder, unspecified: Secondary | ICD-10-CM | POA: Diagnosis not present

## 2020-01-23 DIAGNOSIS — F329 Major depressive disorder, single episode, unspecified: Secondary | ICD-10-CM | POA: Diagnosis not present

## 2020-01-23 MED ORDER — BUPROPION HCL ER (XL) 150 MG PO TB24: 150.0000 mg | ORAL_TABLET | Freq: Every day | ORAL | 0 refills | Status: DC

## 2020-01-23 NOTE — Progress Notes (Signed)
BP 125/88   Pulse 62   Temp 98.1 F (36.7 C) (Oral)   Wt (!) 344 lb (156 kg)   SpO2 98%   BMI 53.99 kg/m    Subjective:    Patient ID: Nancy Garza, female    DOB: [birth year], 39 y.o.   MRN: [id number]  HPI: Nancy Garza is a 39 y.o. female  Chief Complaint  Patient presents with  . Anxiety    pt states the venlafaxine is not helping  . Depression  . Paperwork   Here today following up on anxiety and depression. Recently dropped out of school, feeling overwhelmed with care of her mother and working full time. Went back to work last week but had a really hard time with focus, motivation, agitation. Not feeling under good control with effexor regimen at this time. Does feel like it helps keep her more calm but overall still having poor motivation, agitation, etc. Denies SI/HI.   Has FMLA forms - requesting continuous leave 7/8 - 8/25. Starting 8/23 - for 2 months, will write for 5 days per month intermittent leave for her mood issues.   Relevant past medical, surgical, family and social history reviewed and updated as indicated. Interim medical history since our last visit reviewed. Allergies and medications reviewed and updated.  Review of Systems  Per HPI unless specifically indicated above     Objective:    BP 125/88   Pulse 62   Temp 98.1 F (36.7 C) (Oral)   Wt (!) 344 lb (156 kg)   SpO2 98%   BMI 53.99 kg/m   Wt Readings from Last 3 Encounters:  01/23/20 (!) 344 lb (156 kg)  01/04/20 (!) 346 lb 6.4 oz (157.1 kg)  12/21/19 (!) 344 lb (156 kg)    Physical Exam Vitals and nursing note reviewed.  Constitutional:      Appearance: Normal appearance. She is not ill-appearing.  HENT:     Head: Atraumatic.  Eyes:     Extraocular Movements: Extraocular movements intact.     Conjunctiva/sclera: Conjunctivae normal.  Cardiovascular:     Rate and Rhythm: Normal rate and regular rhythm.     Heart sounds: Normal heart sounds.  Pulmonary:     Effort:  Pulmonary effort is normal.     Breath sounds: Normal breath sounds.  Musculoskeletal:        General: Normal range of motion.     Cervical back: Normal range of motion and neck supple.  Skin:    General: Skin is warm and dry.  Neurological:     Mental Status: She is alert and oriented to person, place, and time.  Psychiatric:        Mood and Affect: Mood normal.        Thought Content: Thought content normal.        Judgment: Judgment normal.     Results for orders placed or performed in visit on 04/19/19  WET PREP FOR TRICH, YEAST, CLUE   Specimen: Vaginal Fluid   VAGINAL FLUI  Result Value Ref Range   Trichomonas Exam Negative Negative   Yeast Exam Negative Negative   Clue Cell Exam Positive (A) Negative      Assessment & Plan:   Problem List Items Addressed This Visit      Other   Anxiety and depression - Primary    FMLA completed, add wellbutrin and continue effexor. Monitor closely for benefit      Relevant Medications   buPROPion [facility]  XL) 150 MG 24 hr tablet     30 minutes spent today in direct patient care, counseling, and coordination of care and paperwork  Follow up plan: Return in about 4 weeks (around 02/20/2020) for Mood and anxiety f/u.

## 2020-01-24 ENCOUNTER — Telehealth: Payer: Self-pay | Admitting: Family Medicine

## 2020-01-25 NOTE — Assessment & Plan Note (Signed)
FMLA completed, add wellbutrin and continue effexor. Monitor closely for benefit

## 2020-01-26 NOTE — Telephone Encounter (Signed)
Called and let patient know that FMLA was completed and ready for her to pick up. Cannot fax back as patient has not completed and signed her portion, advised her of this. Patient states she will pick up paperwork today. Placed up front for pick up.

## 2020-02-19 ENCOUNTER — Other Ambulatory Visit: Payer: Self-pay | Admitting: Family Medicine

## 2020-02-19 ENCOUNTER — Telehealth: Payer: Self-pay | Admitting: Family Medicine

## 2020-02-19 MED ORDER — VENLAFAXINE HCL ER 75 MG PO CP24
ORAL_CAPSULE | ORAL | 0 refills | Status: DC
Start: 2020-02-19 — End: 2020-05-17

## 2020-02-19 NOTE — Telephone Encounter (Signed)
Copied from CRM [id number]. Topic: General - Inquiry >> Feb 19, 2020  2:47 PM Adrian Prince D wrote: Reason for CRM: Patient needs her refill for the generic Effexor XR 75mg  to be a 90 day supply in order for her insurance to pay. It was originally written for 30 days and she got it one time but for further refills it must be a 90 day supply. Patient witll need a new prescription.Please advise

## 2020-02-19 NOTE — Telephone Encounter (Signed)
Routing to provider to advise. Previous RL patient, last seen 01/23/20

## 2020-02-23 ENCOUNTER — Ambulatory Visit: Payer: Managed Care, Other (non HMO) | Admitting: Nurse Practitioner

## 2020-03-14 ENCOUNTER — Telehealth: Payer: Self-pay

## 2020-03-14 NOTE — Telephone Encounter (Signed)
Copied from CRM [id number]. Topic: General - Other >> Mar 14, 2020  4:35 PM Dalphine Handing A wrote: Patient had some FMLA paperwork filled out by [facility] from a claim in July 2021 and patients stated that FMLA is saying we "missed" a form. Patient cannot be paid until form is complete and is bringing the form in tomorrow morning for Shanda Bumps to look at. Patient stated that she would like a callback in regards to if Shanda Bumps will require an appt for the form to be filled out. Form is time sensitive. Please advise

## 2020-03-15 ENCOUNTER — Other Ambulatory Visit: Payer: Self-pay | Admitting: Family Medicine

## 2020-03-16 NOTE — Telephone Encounter (Signed)
Requested medications are due for refill today yes  Requested medications are on the active medication list yes  Last refill 7/25  Last visit 12/2019 (last visit that references med/dx was 07/2019  Future visit scheduled no  Notes to clinic HTN/Toprol not addressed in visit since 07/2019, please assess

## 2020-03-16 NOTE — Telephone Encounter (Signed)
Requested medications are due for refill today yes  Requested medications are on the active medication list yes  Last refill 05/29/19  Notes to clinic Failed protocol of lab work missing, please assess.

## 2020-03-17 ENCOUNTER — Other Ambulatory Visit: Payer: Self-pay | Admitting: Family Medicine

## 2020-03-17 NOTE — Telephone Encounter (Signed)
Requested Prescriptions  Pending Prescriptions Disp Refills   chlorthalidone (HYGROTON) 25 MG tablet [Pharmacy Med Name: CHLORTHALIDONE 25MG  TABLETS] 90 tablet 0    Sig: TAKE 1 TABLET(25 MG) BY MOUTH DAILY     Cardiovascular: Diuretics - Thiazide Failed - 03/17/2020 10:15 AM      Failed - Ca in normal range and within 360 days    Calcium  Date Value Ref Range Status  03/03/2019 9.4 8.7 - 10.2 mg/dL Final         Failed - Cr in normal range and within 360 days    Creatinine, Ser  Date Value Ref Range Status  03/03/2019 0.84 0.57 - 1.00 mg/dL Final         Failed - K in normal range and within 360 days    Potassium  Date Value Ref Range Status  03/03/2019 4.4 3.5 - 5.2 mmol/L Final         Failed - Na in normal range and within 360 days    Sodium  Date Value Ref Range Status  03/03/2019 138 134 - 144 mmol/L Final         Passed - Last BP in normal range    BP Readings from Last 1 Encounters:  01/23/20 125/88         Passed - Valid encounter within last 6 months    Recent Outpatient Visits          1 month ago Anxiety and depression   [facility], [place] N[place]   2 months ago Caregiver stress   [facility], NP   2 months ago Anxiety and depression   [facility], [place]   3 months ago Anxiety and depression   [facility] [place]   6 months ago Benign hypertension   [facility] [place], [place], [place]      Future Appointments            In 1 week [place], NP [facility], [facility]

## 2020-03-18 NOTE — Telephone Encounter (Signed)
Called and spoke to patient. She states she is bringing the form by today.

## 2020-03-18 NOTE — Telephone Encounter (Signed)
Can we see if she has enough to get to 10/25 appt?

## 2020-03-18 NOTE — Telephone Encounter (Signed)
Patient states that her insurance is going to end today as she has left her previous job and new insurance doesn't kick in until 04/01/20. Would like if prescription can go ahead and be sent in for her.

## 2020-03-18 NOTE — Telephone Encounter (Signed)
Pt came in to drop off forms. Left in bin to be reviewed.

## 2020-03-25 ENCOUNTER — Encounter: Payer: Managed Care, Other (non HMO) | Admitting: Nurse Practitioner

## 2020-03-25 ENCOUNTER — Encounter: Payer: Managed Care, Other (non HMO) | Admitting: Family Medicine

## 2020-03-25 NOTE — Telephone Encounter (Signed)
Called pt scheduled her for 10/29

## 2020-03-25 NOTE — Telephone Encounter (Signed)
Forms are in the incomplete bin because patient needs an appointment to have these filled out as Nancy Garza has not seen her. Patient had an appointment today but it was cancelled. Please call her to reschedule and we can complete the forms then.

## 2020-03-25 NOTE — Telephone Encounter (Signed)
Patient is calling to check on the status of her disability forms please advise CB- [phone]

## 2020-03-29 ENCOUNTER — Other Ambulatory Visit: Payer: Self-pay

## 2020-03-29 ENCOUNTER — Encounter: Payer: Self-pay | Admitting: Nurse Practitioner

## 2020-03-29 ENCOUNTER — Ambulatory Visit (INDEPENDENT_AMBULATORY_CARE_PROVIDER_SITE_OTHER): Payer: Managed Care, Other (non HMO) | Admitting: Nurse Practitioner

## 2020-03-29 VITALS — BP 153/93 | HR 70 | Temp 98.3°F | Resp 16 | Wt 351.2 lb

## 2020-03-29 DIAGNOSIS — F32A Depression, unspecified: Secondary | ICD-10-CM

## 2020-03-29 DIAGNOSIS — F419 Anxiety disorder, unspecified: Secondary | ICD-10-CM | POA: Diagnosis not present

## 2020-03-29 MED ORDER — BUPROPION HCL ER (XL) 150 MG PO TB24: 150.0000 mg | ORAL_TABLET | Freq: Every day | ORAL | 1 refills | Status: DC

## 2020-03-29 NOTE — Assessment & Plan Note (Signed)
Chronic, ongoing.  Will restart bupropion - refill sent into pharmacy.  FMLA paperwork filled out from mental health breakdown during summer. Plan to follow up after new insurance kicks in for physical.  Encouraged patient to call us in the meantime with any questions.

## 2020-03-29 NOTE — Progress Notes (Signed)
BP (!) 153/93   Pulse 70   Temp 98.3 F (36.8 C) (Oral)   Resp 16   Wt (!) 351 lb 3.2 oz (159.3 kg)   BMI 55.12 kg/m    Subjective:    Patient ID: Nancy Garza, female    DOB: 07-18-1980, 39 y.o.   MRN: [id number]  HPI: Nancy Garza is a 39 y.o. female presenting for [facility] paperwork.  Chief Complaint  Patient presents with  . Advice Only    Patient presents in ofifce todya to discuss short term disability form.    Patient reports she was out of work in July for a mental health break down.  At this time, she was unable to perform her work duties because she could not concentrate on simple tasks.  Her mental health was severely suffering.  She was started on bupropion and venlafaxine and this has made a significant difference.  She recently stopped the bupropion and is requesting to be restarted on it today.   Our office filled out paperwork for her back in August and her previous employer is now requesting more forms to be filled out from that time frame.  She is starting a new job that is outside the home Monday and is very excited to be back in the office.  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  No Known Allergies  Outpatient Encounter Medications as of 03/29/2020  Medication Sig  . buPROPion (WELLBUTRIN XL) 150 MG 24 hr tablet Take 1 tablet (150 mg total) by mouth daily.  . chlorthalidone (HYGROTON) 25 MG tablet TAKE 1 TABLET(25 MG) BY MOUTH DAILY  . Cyanocobalamin (VITAMIN B-12 PO) Take by mouth daily.  . ferrous sulfate 324 (65 Fe) MG TBEC TAKE 1 TABLET BY MOUTH THREE TIMES DAILY  . metoprolol succinate (TOPROL-XL) 100 MG 24 hr tablet TAKE 1 TABLET BY MOUTH EVERY DAY  . venlafaxine XR (EFFEXOR-XR) 75 MG 24 hr capsule TAKE 1 CAPSULE(75 MG) BY MOUTH DAILY WITH BREAKFAST  . [DISCONTINUED] buPROPion (WELLBUTRIN XL) 150 MG 24 hr tablet TAKE 1 TABLET(150 MG) BY MOUTH DAILY  . amLODipine (NORVASC) 5 MG tablet Take 1 tablet (5  mg total) by mouth daily. KEEP UPCOMING APPT (Patient not taking: Reported on 03/29/2020)   No facility-administered encounter medications on file as of 03/29/2020.   Patient Active Problem List   Diagnosis Date Noted  . Anxiety and depression 12/22/2019  . Hyperlipidemia 08/23/2019  . Iron deficiency anemia 03/08/2019  . Anemia 01/25/2019  . Morbid obesity (HCC) 12/16/2014  . Benign hypertension 12/16/2014   Past Medical History:  Diagnosis Date  . Abnormal Pap smear of cervix    LEEP procedure  . Hypertension    Relevant past medical, surgical, family and social history reviewed and updated as indicated. Interim medical history since our last visit reviewed.  Review of Systems  Constitutional: Negative.   Skin: Negative.   Neurological: Negative.   Psychiatric/Behavioral: Negative.     Per HPI unless specifically indicated above     Objective:    BP (!) 153/93   Pulse 70   Temp 98.3 F (36.8 C) (Oral)   Resp 16   Wt (!) 351 lb 3.2 oz (159.3 kg)   BMI 55.12 kg/m   Wt Readings from Last 3 Encounters:  03/29/20 (!) 351 lb 3.2 oz (159.3 kg)  01/23/20 (!) 344 lb (156 kg)  01/04/20 (!) 346 lb 6.4 oz (157.1 kg)  Physical Exam Vitals and nursing note reviewed.  Constitutional:      Appearance: Normal appearance. She is obese.  Skin:    Coloration: Skin is not jaundiced or pale.     Findings: No erythema.  Neurological:     Mental Status: She is alert and oriented to person, place, and time. Mental status is at baseline.     Motor: No weakness.     Gait: Gait normal.  Psychiatric:        Mood and Affect: Mood normal.        Behavior: Behavior normal.        Thought Content: Thought content normal.        Judgment: Judgment normal.     Results for orders placed or performed in visit on 04/19/19  WET PREP FOR TRICH, YEAST, CLUE   Specimen: Vaginal Fluid   VAGINAL FLUI  Result Value Ref Range   Trichomonas Exam Negative Negative   Yeast Exam Negative  Negative   Clue Cell Exam Positive (A) Negative      Assessment & Plan:   Problem List Items Addressed This Visit      Other   Anxiety and depression - Primary    Chronic, ongoing.  Will restart bupropion - refill sent into pharmacy.  FMLA paperwork filled out from mental health breakdown during summer. Plan to follow up after new insurance kicks in for physical.  Encouraged patient to call us in the meantime with any questions.      Relevant Medications   buPROPion (WELLBUTRIN XL) 150 MG 24 hr tablet       Follow up plan: Return if symptoms worsen or fail to improve.

## 2020-03-29 NOTE — Patient Instructions (Signed)
Stress, Adult °Stress is a normal reaction to life events. Stress is what you feel when life demands more than you are used to, or more than you think you can handle. Some stress can be useful, such as studying for a test or meeting a deadline at work. Stress that occurs too often or for too long can cause problems. It can affect your emotional health and interfere with relationships and normal daily activities. Too much stress can weaken your body's defense system (immune system) and increase your risk for physical illness. If you already have a medical problem, stress can make it worse. °What are the causes? °All sorts of life events can cause stress. An event that causes stress for one person may not be stressful for another person. Major life events, whether positive or negative, commonly cause stress. Examples include: °· Losing a job or starting a new job. °· Losing a loved one. °· Moving to a new town or home. °· Getting married or divorced. °· Having a baby. °· Getting injured or sick. °Less obvious life events can also cause stress, especially if they occur day after day or in combination with each other. Examples include: °· Working long hours. °· Driving in traffic. °· Caring for children. °· Being in debt. °· Being in a difficult relationship. °What are the signs or symptoms? °Stress can cause emotional symptoms, including: °· Anxiety. This is feeling worried, afraid, on edge, overwhelmed, or out of control. °· Anger, including irritation or impatience. °· Depression. This is feeling sad, down, helpless, or guilty. °· Trouble focusing, remembering, or making decisions. °Stress can cause physical symptoms, including: °· Aches and pains. These may affect your head, neck, back, stomach, or other areas of your body. °· Tight muscles or a clenched jaw. °· Low energy. °· Trouble sleeping. °Stress can cause unhealthy behaviors, including: °· Eating to feel better (overeating) or skipping meals. °· Working too  much or putting off tasks. °· Smoking, drinking alcohol, or using drugs to feel better. °How is this diagnosed? °Stress is diagnosed through an assessment by your health care provider. He or she may diagnose this condition based on: °· Your symptoms and any stressful life events. °· Your medical history. °· Tests to rule out other causes of your symptoms. °Depending on your condition, your health care provider may refer you to a specialist for further evaluation. °How is this treated? ° °Stress management techniques are the recommended treatment for stress. Medicine is not typically recommended for the treatment of stress. °Techniques to reduce your reaction to stressful life events include: °· Stress identification. Monitor yourself for symptoms of stress and identify what causes stress for you. These skills may help you to avoid or prepare for stressful events. °· Time management. Set your priorities, keep a calendar of events, and learn to say no. Taking these actions can help you avoid making too many commitments. °Techniques for coping with stress include: °· Rethinking the problem. Try to think realistically about stressful events rather than ignoring them or overreacting. Try to find the positives in a stressful situation rather than focusing on the negatives. °· Exercise. Physical exercise can release both physical and emotional tension. The key is to find a form of exercise that you enjoy and do it regularly. °· Relaxation techniques. These relax the body and mind. The key is to find one or more that you enjoy and use the techniques regularly. Examples include: °? Meditation, deep breathing, or progressive relaxation techniques. °? Yoga or   tai chi. ? Biofeedback, mindfulness techniques, or journaling. ? Listening to music, being out in nature, or participating in other hobbies.  Practicing a healthy lifestyle. Eat a balanced diet, drink plenty of water, limit or avoid caffeine, and get plenty of  sleep.  Having a strong support network. Spend time with family, friends, or other people you enjoy being around. Express your feelings and talk things over with someone you trust. Counseling or talk therapy with a mental health professional may be helpful if you are having trouble managing stress on your own. Follow these instructions at home: Lifestyle   Avoid drugs.  Do not use any products that contain nicotine or tobacco, such as cigarettes, e-cigarettes, and chewing tobacco. If you need help quitting, ask your health care provider.  Limit alcohol intake to no more than 1 drink a day for nonpregnant women and 2 drinks a day for men. One drink equals 12 oz of beer, 5 oz of wine, or 1 oz of hard liquor  Do not use alcohol or drugs to relax.  Eat a balanced diet that includes fresh fruits and vegetables, whole grains, lean meats, fish, eggs, and beans, and low-fat dairy. Avoid processed foods and foods high in added fat, sugar, and salt.  Exercise at least 30 minutes on 5 or more days each week.  Get 7-8 hours of sleep each night. General instructions   Practice stress management techniques as discussed with your health care provider.  Drink enough fluid to keep your urine clear or pale yellow.  Take over-the-counter and prescription medicines only as told by your health care provider.  Keep all follow-up visits as told by your health care provider. This is important. Contact a health care provider if:  Your symptoms get worse.  You have new symptoms.  You feel overwhelmed by your problems and can no longer manage them on your own. Get help right away if:  You have thoughts of hurting yourself or others. If you ever feel like you may hurt yourself or others, or have thoughts about taking your own life, get help right away. You can go to your nearest emergency department or call:  Your local emergency services (911 in the [place]  A suicide crisis helpline, such as the  [place] at 507-293-2218. This is open 24 hours a day. Summary  Stress is a normal reaction to life events. It can cause problems if it happens too often or for too long.  Practicing stress management techniques is the best way to treat stress.  Counseling or talk therapy with a mental health professional may be helpful if you are having trouble managing stress on your own. This information is not intended to replace advice given to you by your health care provider. Make sure you discuss any questions you have with your health care provider. Document Revised: 12/16/2018 Document Reviewed: 07/08/2016 Elsevier Patient Education  [place].

## 2020-04-01 ENCOUNTER — Telehealth: Payer: Self-pay

## 2020-04-01 NOTE — Telephone Encounter (Signed)
Called and informed patient that paperwork that was filled out by Shanda Bumps was finished and waiting on pick up. Paper work was also faxed.

## 2020-05-17 ENCOUNTER — Other Ambulatory Visit: Payer: Self-pay | Admitting: Family Medicine

## 2020-05-28 ENCOUNTER — Other Ambulatory Visit: Payer: Self-pay | Admitting: Family Medicine

## 2020-05-28 NOTE — Telephone Encounter (Signed)
Requested medication (s) are due for refill today:   Yes  Requested medication (s) are on the active medication list:   Yes  Future visit scheduled:   No   Last seen my Anderson Malta.   Last ordered: 08/23/2019 #90, 1 refill  Returned because former pt of Roosvelt Maser and Anderson Malta.   Requested Prescriptions  Pending Prescriptions Disp Refills   amLODipine (NORVASC) 5 MG tablet [Pharmacy Med Name: AMLODIPINE BESYLATE 5MG  TABLETS] 30 tablet     Sig: TAKE 1 TABLET BY MOUTH EVERY DAY. PLEASE KEEP UPCOMING APPOINTMENT      Cardiovascular:  Calcium Channel Blockers Failed - 05/28/2020  9:23 AM      Failed - Last BP in normal range    BP Readings from Last 1 Encounters:  03/29/20 (!) 153/93          Passed - Valid encounter within last 6 months    Recent Outpatient Visits           2 months ago Anxiety and depression   [facility] 03/31/20, NP   4 months ago Anxiety and depression   [facility], [place]   4 months ago Caregiver stress   [facility], NP   5 months ago Anxiety and depression   [facility] [place]   5 months ago Anxiety and depression   [facility] [place]

## 2020-06-10 ENCOUNTER — Other Ambulatory Visit: Payer: Self-pay | Admitting: Family Medicine

## 2020-06-25 ENCOUNTER — Other Ambulatory Visit: Payer: Self-pay | Admitting: Family Medicine

## 2020-06-25 NOTE — Telephone Encounter (Signed)
Requested medication (s) are due for refill today: Yes  Requested medication (s) are on the active medication list: Yes  Last refill:  03/17/20  Future visit scheduled: No  Notes to clinic:  Is pt. Still seen at [facility]?    Requested Prescriptions  Pending Prescriptions Disp Refills   chlorthalidone (HYGROTON) 25 MG tablet [Pharmacy Med Name: CHLORTHALIDONE 25MG  TABLETS] 90 tablet 0    Sig: TAKE 1 TABLET(25 MG) BY MOUTH DAILY      Cardiovascular: Diuretics - Thiazide Failed - 06/25/2020  7:28 AM      Failed - Ca in normal range and within 360 days    Calcium  Date Value Ref Range Status  03/03/2019 9.4 8.7 - 10.2 mg/dL Final          Failed - Cr in normal range and within 360 days    Creatinine, Ser  Date Value Ref Range Status  03/03/2019 0.84 0.57 - 1.00 mg/dL Final          Failed - K in normal range and within 360 days    Potassium  Date Value Ref Range Status  03/03/2019 4.4 3.5 - 5.2 mmol/L Final          Failed - Na in normal range and within 360 days    Sodium  Date Value Ref Range Status  03/03/2019 138 134 - 144 mmol/L Final          Failed - Last BP in normal range    BP Readings from Last 1 Encounters:  03/29/20 (!) 153/93          Passed - Valid encounter within last 6 months    Recent Outpatient Visits           2 months ago Anxiety and depression   [facility] 03/31/20, NP   5 months ago Anxiety and depression   [facility], [place]   5 months ago Caregiver stress   [facility], NP   6 months ago Anxiety and depression   [facility], [place]   6 months ago Anxiety and depression   [facility] [place]

## 2020-07-20 ENCOUNTER — Other Ambulatory Visit: Payer: Self-pay | Admitting: Family Medicine

## 2020-07-20 NOTE — Telephone Encounter (Signed)
Requested Prescriptions  Pending Prescriptions Disp Refills  . chlorthalidone (HYGROTON) 25 MG tablet [Pharmacy Med Name: CHLORTHALIDONE 25MG  TABLETS] 90 tablet 0    Sig: TAKE 1 TABLET(25 MG) BY MOUTH DAILY     Cardiovascular: Diuretics - Thiazide Failed - 07/20/2020  9:48 AM      Failed - Ca in normal range and within 360 days    Calcium  Date Value Ref Range Status  03/03/2019 9.4 8.7 - 10.2 mg/dL Final         Failed - Cr in normal range and within 360 days    Creatinine, Ser  Date Value Ref Range Status  03/03/2019 0.84 0.57 - 1.00 mg/dL Final         Failed - K in normal range and within 360 days    Potassium  Date Value Ref Range Status  03/03/2019 4.4 3.5 - 5.2 mmol/L Final         Failed - Na in normal range and within 360 days    Sodium  Date Value Ref Range Status  03/03/2019 138 134 - 144 mmol/L Final         Failed - Last BP in normal range    BP Readings from Last 1 Encounters:  03/29/20 (!) 153/93         Passed - Valid encounter within last 6 months    Recent Outpatient Visits          3 months ago Anxiety and depression   [facility] 03/31/20, NP   5 months ago Anxiety and depression   [facility] [place]   6 months ago Caregiver stress   [facility], NP   7 months ago Anxiety and depression   [facility] [place]   7 months ago Anxiety and depression   [facility] [place]

## 2020-08-14 ENCOUNTER — Other Ambulatory Visit: Payer: Self-pay | Admitting: Family Medicine

## 2020-11-03 DIAGNOSIS — B348 Other viral infections of unspecified site: Secondary | ICD-10-CM | POA: Insufficient documentation

## 2021-07-22 ENCOUNTER — Emergency Department
Admission: EM | Admit: 2021-07-22 | Discharge: 2021-07-22 | Disposition: A | Discharge status: Home / Self Care | Payer: Self-pay | Attending: Emergency Medicine | Admitting: Emergency Medicine

## 2021-07-22 ENCOUNTER — Other Ambulatory Visit: Payer: Self-pay

## 2021-07-22 ENCOUNTER — Emergency Department: Payer: Self-pay

## 2021-07-22 DIAGNOSIS — M778 Other enthesopathies, not elsewhere classified: Secondary | ICD-10-CM

## 2021-07-22 DIAGNOSIS — M67843 Other specified disorders of tendon, right hand: Secondary | ICD-10-CM | POA: Insufficient documentation

## 2021-07-22 DIAGNOSIS — I10 Essential (primary) hypertension: Secondary | ICD-10-CM | POA: Insufficient documentation

## 2021-07-22 MED ORDER — METOPROLOL SUCCINATE ER 100 MG PO TB24: 100.0000 mg | ORAL_TABLET | Freq: Every day | ORAL | 1 refills | Status: DC

## 2021-07-22 MED ORDER — NIFEDIPINE ER OSMOTIC RELEASE 30 MG PO TB24: 30.0000 mg | ORAL_TABLET | Freq: Every day | ORAL | 1 refills | Status: DC

## 2021-07-22 MED ORDER — IBUPROFEN 600 MG PO TABS: 600.0000 mg | ORAL_TABLET | Freq: Four times a day (QID) | ORAL | 0 refills | Status: DC | PRN

## 2021-07-22 NOTE — ED Notes (Signed)
Pt states she is out of BP meds currently.

## 2021-07-22 NOTE — ED Triage Notes (Signed)
Pt comes with c/o right wrist pain that started yesterday. Pt states no known injury.

## 2021-07-22 NOTE — ED Provider Notes (Signed)
[facility] Provider Note    Event Date/Time   First MD Initiated Contact with Patient 07/22/21 [phone]     (approximate)   History   Wrist Pain   HPI  Nancy Garza is a 41 y.o. female   presents to the ED with complaint of right wrist pain that started yesterday.  Patient is unaware of any known injury.  Patient has not taken any over-the-counter medication.  She does have a history of hypertension but has not been to see her PCP as she does not have any insurance and is out of medication.  She has a current list of medications on MyChart.      Physical Exam   Triage Vital Signs: ED Triage Vitals  Enc Vitals Group     BP 07/22/21 0900 (!) 191/117     Pulse Rate 07/22/21 0900 81     Resp 07/22/21 0900 17     Temp 07/22/21 0900 98.1 F (36.7 C)     Temp src --      SpO2 07/22/21 0900 95 %     Weight --      Height --      Head Circumference --      Peak Flow --      Pain Score 07/22/21 0859 9     Pain Loc --      Pain Edu? --      Excl. in GC? --     Most recent vital signs: Vitals:   07/22/21 0900 07/22/21 1100  BP: (!) 191/117 (!) 188/101  Pulse: 81 88  Resp: 17 18  Temp: 98.1 F (36.7 C)   SpO2: 95% 98%     General: Awake, no distress.  CV:  Good peripheral perfusion.  Resp:  Normal effort.  Abd:  No distention.  Other:  Examination of the right wrist there is no gross deformity and no soft tissue edema or evidence of injury.  Radial pulses present.  Motor sensory function intact distal to the wrist.  Capillary refills less than 3 seconds.  Motor and sensory function intact.  With abduction of the right thumb it reproduces the pain from her thumb into her wrist which is suspicious for tendinitis.  She can flex and extend her wrist without difficulty.  Skin is intact and no discoloration or warmth is present.   ED Results / Procedures / Treatments   Labs (all labs ordered are listed, but only abnormal results are  displayed) Labs Reviewed - No data to display   RADIOLOGY Right wrist x-ray reviewed by myself was negative for any acute bony abnormality.  Radiology report was reviewed and negative report.    PROCEDURES:  Critical Care performed:   Procedures   MEDICATIONS ORDERED IN ED: Medications - No data to display   IMPRESSION / MDM / ASSESSMENT AND PLAN / ED COURSE  I reviewed the triage vital signs and the nursing notes.   Differential diagnosis includes, but is not limited to, right wrist pain, wrist strain, degenerative joint disease, tendinitis, gout.  41 year old female presents to the ED with complaint of right wrist pain however on exam patient has increased pain with abduction of her thumb which radiates down into her wrist.  This is more consistent with a tendinitis.  X-rays were reviewed and no acute bony abnormality was noted.  Patient was placed in a thumb spica splint for protection and support.  She was also started on ibuprofen every 6 hours to  see if this helps with her thumb pain.  We discussed her high blood pressure and the fact that she has been out of her blood pressure medication for over 6 months.  She was [facility] as she can be [facility] there for [facility].  The 2 medications that she has been taking has been metoprolol XL 100 mg daily and Procardia XL 30 mg 1 daily.  Prescription for 60 days was sent to the pharmacy and patient is aware that controlling her blood pressure decreases her chances for stroke or MI.        FINAL CLINICAL IMPRESSION(S) / ED DIAGNOSES   Final diagnoses:  Thumb tendonitis  Hypertension, uncontrolled     Rx / DC Orders   ED Discharge Orders          Ordered    metoprolol succinate (TOPROL-XL) 100 MG 24 hr tablet  Daily,   Status:  Discontinued        07/22/21 1101    ibuprofen (ADVIL) 600 MG tablet  Every 6 hours PRN,   Status:  Discontinued        07/22/21 1101    ibuprofen (ADVIL) 600 MG tablet   Every 6 hours PRN        07/22/21 1115    metoprolol succinate (TOPROL-XL) 100 MG 24 hr tablet  Daily        07/22/21 1115    NIFEdipine (PROCARDIA-XL/NIFEDICAL-XL) 30 MG 24 hr tablet  Daily        07/22/21 1115             Note:  This document was prepared using Dragon voice recognition software and may include unintentional dictation errors.   Tommi Rumps, PA-C 07/22/21 1153    Delton [place], MD 07/22/21 1515

## 2021-07-22 NOTE — Discharge Instructions (Signed)
[facility] to make an appointment.  You will need to have a visit scheduled and be seen before running out of blood pressure medication.  In order to help control your blood pressure your medication should be taken every day.  This will prevent the possibility of stroke or heart attack.  Also prescription for ibuprofen 600 mg every 6 hours as needed for right wrist and thumb pain.  Wear the thumb spica splint for protection and support for approximately 5 to 7 days.  You may use ice or heat to the area as needed for discomfort.

## 2021-07-22 NOTE — ED Notes (Signed)
See triage note  presents with right wrist pain  no deformity noted  good pulses  denies any injury

## 2021-09-30 ENCOUNTER — Ambulatory Visit (INDEPENDENT_AMBULATORY_CARE_PROVIDER_SITE_OTHER): Payer: Self-pay

## 2021-09-30 ENCOUNTER — Ambulatory Visit
Admission: EM | Admit: 2021-09-30 | Discharge: 2021-09-30 | Disposition: A | Discharge status: Home / Self Care | Payer: Self-pay | Attending: Physician Assistant | Admitting: Physician Assistant

## 2021-09-30 DIAGNOSIS — R059 Cough, unspecified: Secondary | ICD-10-CM

## 2021-09-30 DIAGNOSIS — J189 Pneumonia, unspecified organism: Secondary | ICD-10-CM

## 2021-09-30 DIAGNOSIS — R0602 Shortness of breath: Secondary | ICD-10-CM

## 2021-09-30 MED ORDER — ALBUTEROL SULFATE HFA 108 (90 BASE) MCG/ACT IN AERS
4.0000 | INHALATION_SPRAY | Freq: Once | RESPIRATORY_TRACT | Status: AC
  Administered 2021-09-30: 4 via RESPIRATORY_TRACT

## 2021-09-30 MED ORDER — DOXYCYCLINE HYCLATE 100 MG PO CAPS: 100.0000 mg | ORAL_CAPSULE | Freq: Two times a day (BID) | ORAL | 0 refills | Status: DC

## 2021-09-30 NOTE — ED Triage Notes (Signed)
Patient presents to Urgent Care with complaints of SOB, sore throat, productive cough, and back pain since Saturday. Pt states phlegm had a pinkish tint on Sunday and now tan color. Pt states she felt better Monday, but states symptoms have not resolved. Treating symptoms with mucinex, icy hot pad, and aleve.  ?

## 2021-09-30 NOTE — Discharge Instructions (Addendum)
Return if any problems.  Go to the Emergency department if symptoms worsen or change.  See your Physician for recheck in 1 week.  YOu will need follow up for abnormal area on your chest xray  Repeat chest xray and possible Ct scan.  ?

## 2021-09-30 NOTE — ED Provider Notes (Signed)
?[facility] ? ? ? ?CSN: [id number] ?Arrival date & time: 09/30/21  1040 ? ? ?  ? ?History   ?Chief Complaint ?Chief Complaint  ?Patient presents with  ? Shortness of Breath  ? ? ?HPI ?ANJU SERENO is a 41 y.o. female.  ? ?Pt complains of a cough and shortness of breath.  Pt reports she has had bronchitis in the past.  Pt stopped smoking.  Pt denies fever.  Pt becomes short of breath with exertion  ? ?The history is provided by the patient. No language interpreter was used.  ?Shortness of Breath ?Severity:  Moderate ?Timing:  Constant ?Chronicity:  New ?Worsened by:  Nothing ?Ineffective treatments:  None tried ?Associated symptoms: cough   ?Risk factors: no hx of PE/DVT   ? ?Past Medical History:  ?Diagnosis Date  ? Abnormal Pap smear of cervix   ? LEEP procedure  ? Hypertension   ? ? ?Patient Active Problem List  ? Diagnosis Date Noted  ? Rhinovirus infection 11/03/2020  ? Anxiety and depression 12/22/2019  ? Hyperlipidemia 08/23/2019  ? Iron deficiency anemia 03/08/2019  ? Anemia 01/25/2019  ? Morbid obesity (HCC) 12/16/2014  ? Benign hypertension 12/16/2014  ? ? ?History reviewed. No pertinent surgical history. ? ?OB History   ?No obstetric history on file. ?  ? ? ? ?Home Medications   ? ?Prior to Admission medications   ?Medication Sig Start Date End Date Taking? Authorizing Provider  ?buPROPion (WELLBUTRIN XL) 150 MG 24 hr tablet Take 1 tablet (150 mg total) by mouth daily. 03/29/20   Valentino Nose, NP  ?chlorthalidone (HYGROTON) 25 MG tablet TAKE 1 TABLET(25 MG) BY MOUTH DAILY 07/20/20   [name], NP  ?Cyanocobalamin (VITAMIN B-12 PO) Take by mouth daily.    [provider]  ?ferrous sulfate 324 (65 Fe) MG TBEC TAKE 1 TABLET BY MOUTH THREE TIMES DAILY 08/14/20   Olevia Perches P, DO  ?ibuprofen (ADVIL) 600 MG tablet Take 1 tablet (600 mg total) by mouth every 6 (six) hours as needed. 07/22/21   Tommi Rumps, PA-C  ?metoprolol succinate (TOPROL-XL) 100 MG 24 hr tablet  Take 1 tablet (100 mg total) by mouth daily. Take with or immediately following a meal. 07/22/21   Bridget Hartshorn L, PA-C  ?NIFEdipine (PROCARDIA-XL/NIFEDICAL-XL) 30 MG 24 hr tablet Take 1 tablet (30 mg total) by mouth daily. 07/22/21 07/22/22  Tommi Rumps, PA-C  ?venlafaxine XR (EFFEXOR-XR) 75 MG 24 hr capsule TAKE 1 CAPSULE(75 MG) BY MOUTH DAILY WITH BREAKFAST 05/17/20   Olevia Perches P, DO  ? ? ?Family History ?Family History  ?Problem Relation Age of Onset  ? Diabetes Mother   ? Hypertension Mother   ? Stroke Mother   ? COPD Mother   ? Hypertension Father   ? Diabetes Maternal Grandmother   ? Hypertension Maternal Grandmother   ? Stroke Maternal Grandmother   ? Diabetes Paternal Grandmother   ? Hypertension Paternal Grandmother   ? Stroke Paternal Grandmother   ? ? ?Social History ?Social History  ? ?Tobacco Use  ? Smoking status: Every Day  ? Smokeless tobacco: Never  ? Tobacco comments:  ?  hemp CBD  ?Vaping Use  ? Vaping Use: Never used  ?Substance Use Topics  ? Alcohol use: Yes  ?  Alcohol/week: 3.0 standard drinks  ?  Types: 3 Shots of liquor per week  ?  Comment: on the weekends  ? Drug use: Yes  ?  Types: Marijuana  ?  Comment: history of marijuana use  ? ? ? ?Allergies   ?Patient has no known allergies. ? ? ?Review of Systems ?Review of Systems  ?Respiratory:  Positive for cough and shortness of breath.   ?All other systems reviewed and are negative. ? ? ?Physical Exam ?Triage Vital Signs ?ED Triage Vitals  ?Enc Vitals Group  ?   BP 09/30/21 1055 (S) (!) 176/105  ?   Pulse Rate 09/30/21 1055 98  ?   Resp 09/30/21 1055 (!) 28  ?   Temp 09/30/21 1055 98.1 ?F (36.7 ?C)  ?   Temp Source 09/30/21 1049 Oral  ?   SpO2 09/30/21 1055 94 %  ?   Weight 09/30/21 1100 (!) 353 lb (160.1 kg)  ?   Height --   ?   Head Circumference --   ?   Peak Flow --   ?   Pain Score 09/30/21 1059 0  ?   Pain Loc --   ?   Pain Edu? --   ?   Excl. in GC? --   ? ?No data found. ? ?Updated Vital Signs ?BP (S) (!) 176/105 (BP  Location: Left Arm) Comment: out of her BP meds  Pulse 98   Temp 98.1 ?F (36.7 ?C) (Oral)   Resp (!) 28   Wt (!) 160.1 kg   LMP 09/13/2021   SpO2 94%   BMI 56.98 kg/m?  ? ?Visual Acuity ?Right Eye Distance:   ?Left Eye Distance:   ?Bilateral Distance:   ? ?Right Eye Near:   ?Left Eye Near:    ?Bilateral Near:    ? ?Physical Exam ?Vitals and nursing note reviewed.  ?Constitutional:   ?   Appearance: She is well-developed.  ?HENT:  ?   Head: Normocephalic.  ?Cardiovascular:  ?   Rate and Rhythm: Normal rate.  ?Pulmonary:  ?   Effort: Pulmonary effort is normal.  ?   Breath sounds: Rhonchi present. No decreased breath sounds.  ?Abdominal:  ?   General: There is no distension.  ?Musculoskeletal:     ?   General: Normal range of motion.  ?   Cervical back: Normal range of motion.  ?Skin: ?   General: Skin is warm.  ?Neurological:  ?   General: No focal deficit present.  ?   Mental Status: She is alert and oriented to person, place, and time.  ?Psychiatric:     ?   Mood and Affect: Mood normal.  ? ? ? ?UC Treatments / Results  ?Labs ?(all labs ordered are listed, but only abnormal results are displayed) ?Labs Reviewed - No data to display ? ?EKG ? ? ?Radiology ?DG Chest 2 View ? ?Result Date: 09/30/2021 ?CLINICAL DATA:  Shortness of breath, cough. EXAM: CHEST - 2 VIEW COMPARISON:  None Available. FINDINGS: The heart size and mediastinal contours are within normal limits. Right lower lobe opacity is noted which may represent pneumonia, but it appears to be rounded on the lateral projection suggesting possible mass. The visualized skeletal structures are unremarkable. IMPRESSION: Right lower lobe opacity is noted which may simply represent pneumonia, but it appears to be rounded on the lateral projection suggesting possible mass. CT scan of the chest is recommended for further evaluation. Electronically Signed   By: Lupita Raider M.D.   On: 09/30/2021 11:42   ? ?Procedures ?Procedures (including critical care  time) ? ?Medications Ordered in UC ?Medications  ?albuterol (VENTOLIN HFA) 108 (90 Base) MCG/ACT inhaler 4 puff (4 puffs  Inhalation Given 09/30/21 1116)  ? ? ?Initial Impression / Assessment and Plan / UC Course  ?I have reviewed the triage vital signs and the nursing notes. ? ?Pertinent labs & imaging results that were available during my care of the patient were reviewed by me and considered in my medical decision making (see chart for details). ? ?  ? ?MDM:  Pt given albuterol 4 puffs.  Chest xray shows probable pneumonia.  Radiolgist concerned about possible mass.  Pt is advised she needs followup.  Pt given rx for doxycycline.  Pt advised to use inhaler 2 puffs every 4 hours.  To ED if symptoms worsen or change  ?Final Clinical Impressions(s) / UC Diagnoses  ? ?Final diagnoses:  ?SOB (shortness of breath)  ?Pneumonia due to infectious organism, unspecified laterality, unspecified part of lung  ? ? ? ?Discharge Instructions   ? ?  ?Return if any problems.  Go to the Emergency department if symptoms worsen or change.  See your Physician for recheck in 1 week.  YOu will need follow up for abnormal area on your chest xray  Repeat chest xray and possible Ct scan.  ? ? ? ?ED Prescriptions   ? ? Medication Sig Dispense Auth. Provider  ? doxycycline (VIBRAMYCIN) 100 MG capsule Take 1 capsule (100 mg total) by mouth 2 (two) times daily. 20 capsule Elson AreasSofia, Chelle Cayton K, [place]PA-C  ? ?  ? ?PDMP not reviewed this encounter. ?An After Visit Summary was printed and given to the patient.  ?  ?Elson AreasSofia, Rene Gonsoulin K, PA-C ?09/30/21 1213 ? ?

## 2021-12-01 ENCOUNTER — Ambulatory Visit: Payer: Self-pay

## 2021-12-04 ENCOUNTER — Ambulatory Visit: Payer: Self-pay | Admitting: Gerontology

## 2021-12-04 VITALS — BP 163/99 | HR 87 | Temp 98.0°F | Ht 66.0 in | Wt 349.3 lb

## 2021-12-04 DIAGNOSIS — Z7689 Persons encountering health services in other specified circumstances: Secondary | ICD-10-CM

## 2021-12-04 DIAGNOSIS — I10 Essential (primary) hypertension: Secondary | ICD-10-CM

## 2021-12-04 MED ORDER — NIFEDIPINE ER OSMOTIC RELEASE 30 MG PO TB24
30.0000 mg | ORAL_TABLET | Freq: Every day | ORAL | 2 refills | Status: DC
  Filled 2021-12-04: qty 30, 30d supply, fill #0
  Filled 2022-01-03: qty 30, 30d supply, fill #1

## 2021-12-04 MED ORDER — METOPROLOL SUCCINATE ER 100 MG PO TB24
100.0000 mg | ORAL_TABLET | Freq: Every day | ORAL | 2 refills | Status: DC
  Filled 2021-12-04: qty 30, 30d supply, fill #0
  Filled 2022-01-03: qty 30, 30d supply, fill #1
  Filled 2022-02-04: qty 30, 30d supply, fill #2

## 2021-12-04 MED ORDER — BLOOD PRESSURE KIT
1.0000 | PACK | Freq: Every day | 0 refills | Status: DC
  Filled 2021-12-04: qty 1, fill #0

## 2021-12-04 NOTE — Patient Instructions (Signed)

## 2021-12-04 NOTE — Progress Notes (Signed)
New Patient Office Visit  Subjective    Patient ID: Nancy Garza, female    DOB: 11-07-1980  Age: 41 y.o. MRN: [id number]  CC:  Chief Complaint  Patient presents with   Hypertension    Patient c/o ongoing HA x 5 days; denies blurring vision, sweating, palpitations. She states she has been out of her BP medication for over a month.     HPI Nancy Garza is a 41 y/o female who has history of hypertension presents to establish care and evaluation of her chronic conditions. She states that she takes 30 mg Nifedipine and 100 mg Metoprolol for hypertension, but was out for  more than 1 month.  She denies chest pain, palpitation, lightheadedness, vision changes.  Overall, she states that she is doing well and offers no further complaint.   Outpatient Encounter Medications as of 12/04/2021  Medication Sig   Blood Pressure KIT 1 kit by Does not apply route daily.   chlorthalidone (HYGROTON) 25 MG tablet TAKE 1 TABLET(25 MG) BY MOUTH DAILY (Patient not taking: Reported on 12/04/2021)   ibuprofen (ADVIL) 600 MG tablet Take 1 tablet (600 mg total) by mouth every 6 (six) hours as needed.   metoprolol succinate (TOPROL-XL) 100 MG 24 hr tablet Take 1 tablet (100 mg total) by mouth daily. Take with or immediately following a meal.   NIFEdipine (PROCARDIA-XL/NIFEDICAL-XL) 30 MG 24 hr tablet Take 1 tablet (30 mg total) by mouth daily.   [DISCONTINUED] buPROPion (WELLBUTRIN XL) 150 MG 24 hr tablet Take 1 tablet (150 mg total) by mouth daily. (Patient not taking: Reported on 12/04/2021)   [DISCONTINUED] Cyanocobalamin (VITAMIN B-12 PO) Take by mouth daily. (Patient not taking: Reported on 12/04/2021)   [DISCONTINUED] doxycycline (VIBRAMYCIN) 100 MG capsule Take 1 capsule (100 mg total) by mouth 2 (two) times daily. (Patient not taking: Reported on 12/04/2021)   [DISCONTINUED] ferrous sulfate 324 (65 Fe) MG TBEC TAKE 1 TABLET BY MOUTH THREE TIMES DAILY (Patient not taking: Reported on 12/04/2021)    [DISCONTINUED] metoprolol succinate (TOPROL-XL) 100 MG 24 hr tablet Take 1 tablet (100 mg total) by mouth daily. Take with or immediately following a meal.   [DISCONTINUED] NIFEdipine (PROCARDIA-XL/NIFEDICAL-XL) 30 MG 24 hr tablet Take 1 tablet (30 mg total) by mouth daily.   [DISCONTINUED] venlafaxine XR (EFFEXOR-XR) 75 MG 24 hr capsule TAKE 1 CAPSULE(75 MG) BY MOUTH DAILY WITH BREAKFAST (Patient not taking: Reported on 12/04/2021)   No facility-administered encounter medications on file as of 12/04/2021.    Past Medical History:  Diagnosis Date   Abnormal Pap smear of cervix    LEEP procedure   Hypertension     History reviewed. No pertinent surgical history.  Family History  Problem Relation Age of Onset   Diabetes Mother    Hypertension Mother    Stroke Mother    COPD Mother    Hypertension Father    Diabetes Maternal Grandmother    Hypertension Maternal Grandmother    Stroke Maternal Grandmother    Diabetes Paternal Grandmother    Hypertension Paternal Grandmother    Stroke Paternal Grandmother     Social History   Socioeconomic History   Marital status: Single    Spouse name: Not on file   Number of children: Not on file   Years of education: Not on file   Highest education level: Not on file  Occupational History   Not on file  Tobacco Use   Smoking status: Every Day   Smokeless tobacco: Never  Tobacco comments:    hemp CBD  Vaping Use   Vaping Use: Never used  Substance and Sexual Activity   Alcohol use: Yes    Alcohol/week: 3.0 standard drinks of alcohol    Types: 3 Shots of liquor per week    Comment: on the weekends   Drug use: Yes    Types: Marijuana    Comment: States she quit 11/30/2021   Sexual activity: Not on file  Other Topics Concern   Not on file  Social History Narrative   Not on file   Social Determinants of Health   Financial Resource Strain: Not on file  Food Insecurity: Not on file  Transportation Needs: Not on file  Physical  Activity: Not on file  Stress: Not on file  Social Connections: Not on file  Intimate Partner Violence: Not on file    Review of Systems  Constitutional: Negative.   HENT: Negative.    Eyes: Negative.   Respiratory: Negative.    Cardiovascular: Negative.   Gastrointestinal: Negative.   Genitourinary: Negative.   Musculoskeletal: Negative.   Skin: Negative.   Neurological: Negative.   Endo/Heme/Allergies: Negative.   Psychiatric/Behavioral: Negative.          Objective    BP (!) 163/99   Pulse 87   Temp 98 F (36.7 C)   Ht _0  (1.676 m)   Wt (!) 349 lb 4.8 oz (158.4 kg)   SpO2 93%   BMI 56.38 kg/m   Physical Exam HENT:     Head: Normocephalic and atraumatic.     Nose: Nose normal.     Mouth/Throat:     Mouth: Mucous membranes are moist.  Eyes:     Extraocular Movements: Extraocular movements intact.     Conjunctiva/sclera: Conjunctivae normal.     Pupils: Pupils are equal, round, and reactive to light.  Cardiovascular:     Rate and Rhythm: Normal rate and regular rhythm.     Pulses: Normal pulses.     Heart sounds: Normal heart sounds.  Pulmonary:     Effort: Pulmonary effort is normal.     Breath sounds: Normal breath sounds.  Abdominal:     General: Bowel sounds are normal.     Palpations: Abdomen is soft.  Genitourinary:    Comments: Deferred per patient Musculoskeletal:        General: Normal range of motion.     Cervical back: Normal range of motion.  Skin:    General: Skin is warm.  Neurological:     General: No focal deficit present.     Mental Status: She is alert and oriented to person, place, and time. Mental status is at baseline.  Psychiatric:        Mood and Affect: Mood normal.        Thought Content: Thought content normal.        Judgment: Judgment normal.         Assessment & Plan:   1. Encounter to establish care -Routine labs will be checked. - CBC w/Diff; Future - Comp Met (CMET); Future - Lipid panel; Future -  HgB A1c; Future - HgB A1c - Lipid panel - Comp Met (CMET) - CBC w/Diff  2. Benign hypertension -Her blood pressure was not under control, she will continue on current medication.  She was provided with blood pressure machine, advised to check her blood pressure daily, record and bring log to follow up appointment.  She was advised to continue on DASH diet,  and exercise as tolerated. - Blood Pressure KIT; 1 kit by Does not apply route daily.  Dispense: 1 kit; Refill: 0 - metoprolol succinate (TOPROL-XL) 100 MG 24 hr tablet; Take 1 tablet (100 mg total) by mouth daily. Take with or immediately following a meal.  Dispense: 30 tablet; Refill: 2 - NIFEdipine (PROCARDIA-XL/NIFEDICAL-XL) 30 MG 24 hr tablet; Take 1 tablet (30 mg total) by mouth daily.  Dispense: 30 tablet; Refill: 2 - Ambulatory referral to Cardiology    Return in about 2 weeks (around 12/18/2021), or if symptoms worsen or fail to improve.   Tyreque Finken Jerold Coombe, NP

## 2021-12-05 ENCOUNTER — Other Ambulatory Visit: Payer: Self-pay

## 2021-12-05 LAB — CBC WITH DIFFERENTIAL/PLATELET
Basophils Absolute: 0 10*3/uL (ref 0.0–0.2)
Basos: 1 %
EOS (ABSOLUTE): 0.6 10*3/uL — ABNORMAL HIGH (ref 0.0–0.4)
Eos: 10 %
Hematocrit: 26.9 % — ABNORMAL LOW (ref 34.0–46.6)
Hemoglobin: 7.9 g/dL — ABNORMAL LOW (ref 11.1–15.9)
Immature Grans (Abs): 0 10*3/uL (ref 0.0–0.1)
Immature Granulocytes: 0 %
Lymphocytes Absolute: 2 10*3/uL (ref 0.7–3.1)
Lymphs: 33 %
MCH: 21.3 pg — ABNORMAL LOW (ref 26.6–33.0)
MCHC: 29.4 g/dL — ABNORMAL LOW (ref 31.5–35.7)
MCV: 73 fL — ABNORMAL LOW (ref 79–97)
Monocytes Absolute: 0.5 10*3/uL (ref 0.1–0.9)
Monocytes: 9 %
Neutrophils Absolute: 2.8 10*3/uL (ref 1.4–7.0)
Neutrophils: 47 %
Platelets: 468 10*3/uL — ABNORMAL HIGH (ref 150–450)
RBC: 3.71 x10E6/uL — ABNORMAL LOW (ref 3.77–5.28)
RDW: 17.2 % — ABNORMAL HIGH (ref 11.7–15.4)
WBC: 6 10*3/uL (ref 3.4–10.8)

## 2021-12-05 LAB — COMPREHENSIVE METABOLIC PANEL
ALT: 13 IU/L (ref 0–32)
AST: 15 IU/L (ref 0–40)
Albumin/Globulin Ratio: 1.3 (ref 1.2–2.2)
Albumin: 4.3 g/dL (ref 3.8–4.8)
Alkaline Phosphatase: 51 IU/L (ref 44–121)
BUN/Creatinine Ratio: 21 (ref 9–23)
BUN: 17 mg/dL (ref 6–20)
Bilirubin Total: <0.2 mg/dL (ref 0.0–1.2)
CO2: 24 mmol/L (ref 20–29)
Calcium: 9.1 mg/dL (ref 8.7–10.2)
Chloride: 102 mmol/L (ref 96–106)
Creatinine, Ser: 0.81 mg/dL (ref 0.57–1.00)
Globulin, Total: 3.4 g/dL (ref 1.5–4.5)
Glucose: 99 mg/dL (ref 70–99)
Potassium: 3.8 mmol/L (ref 3.5–5.2)
Sodium: 141 mmol/L (ref 134–144)
Total Protein: 7.7 g/dL (ref 6.0–8.5)
eGFR: 95 mL/min/{1.73_m2} (ref >59)

## 2021-12-05 LAB — HEMOGLOBIN A1C
Est. average glucose Bld gHb Est-mCnc: 123 mg/dL
Hgb A1c MFr Bld: 5.9 % — ABNORMAL HIGH (ref 4.8–5.6)

## 2021-12-05 LAB — LIPID PANEL
Chol/HDL Ratio: 4.6 ratio — ABNORMAL HIGH (ref 0.0–4.4)
Cholesterol, Total: 165 mg/dL (ref 100–199)
HDL: 36 mg/dL — ABNORMAL LOW (ref >39)
LDL Chol Calc (NIH): 102 mg/dL — ABNORMAL HIGH (ref 0–99)
Triglycerides: 154 mg/dL — ABNORMAL HIGH (ref 0–149)
VLDL Cholesterol Cal: 27 mg/dL (ref 5–40)

## 2021-12-11 ENCOUNTER — Other Ambulatory Visit: Payer: Self-pay

## 2021-12-12 ENCOUNTER — Ambulatory Visit: Payer: Self-pay

## 2021-12-12 ENCOUNTER — Other Ambulatory Visit: Payer: Self-pay | Admitting: Pharmacy Technician

## 2021-12-12 ENCOUNTER — Other Ambulatory Visit: Payer: Self-pay

## 2021-12-12 NOTE — Patient Outreach (Signed)
Patient only signed DOH Attestation.  Would need to provide current year's household income if PAP medications were needed.  Alencia Gordon J. Syd Newsome Patient Advocate Specialist [facility]  

## 2021-12-18 ENCOUNTER — Ambulatory Visit: Payer: Self-pay

## 2021-12-25 ENCOUNTER — Ambulatory Visit: Payer: Self-pay | Admitting: Gerontology

## 2021-12-25 ENCOUNTER — Encounter: Payer: Self-pay | Admitting: Gerontology

## 2021-12-25 VITALS — BP 149/91 | HR 75 | Wt 345.2 lb

## 2021-12-25 DIAGNOSIS — R7303 Prediabetes: Secondary | ICD-10-CM

## 2021-12-25 DIAGNOSIS — I10 Essential (primary) hypertension: Secondary | ICD-10-CM

## 2021-12-25 DIAGNOSIS — D649 Anemia, unspecified: Secondary | ICD-10-CM

## 2021-12-25 NOTE — Patient Instructions (Signed)
Calorie Counting for Weight Loss Calories are units of energy. Your body needs a certain number of calories from food to keep going throughout the day. When you eat or drink more calories than your body needs, your body stores the extra calories mostly as fat. When you eat or drink fewer calories than your body needs, your body burns fat to get the energy it needs. Calorie counting means keeping track of how many calories you eat and drink each day. Calorie counting can be helpful if you need to lose weight. If you eat fewer calories than your body needs, you should lose weight. Ask your health care provider what a healthy weight is for you. For calorie counting to work, you will need to eat the right number of calories each day to lose a healthy amount of weight per week. A dietitian can help you figure out how many calories you need in a day and will suggest ways to reach your calorie goal. A healthy amount of weight to lose each week is usually 1-2 lb (0.5-0.9 kg). This usually means that your daily calorie intake should be reduced by 500-750 calories. Eating 1,200-1,500 calories a day can help most women lose weight. Eating 1,500-1,800 calories a day can help most men lose weight. What do I need to know about calorie counting? Work with your health care provider or dietitian to determine how many calories you should get each day. To meet your daily calorie goal, you will need to: Find out how many calories are in each food that you would like to eat. Try to do this before you eat. Decide how much of the food you plan to eat. Keep a food log. Do this by writing down what you ate and how many calories it had. To successfully lose weight, it is important to balance calorie counting with a healthy lifestyle that includes regular activity. Where do I find calorie information?  The number of calories in a food can be found on a Nutrition Facts label. If a food does not have a Nutrition Facts label, try  to look up the calories online or ask your dietitian for help. Remember that calories are listed per serving. If you choose to have more than one serving of a food, you will have to multiply the calories per serving by the number of servings you plan to eat. For example, the label on a package of bread might say that a serving size is 1 slice and that there are 90 calories in a serving. If you eat 1 slice, you will have eaten 90 calories. If you eat 2 slices, you will have eaten 180 calories. How do I keep a food log? After each time that you eat, record the following in your food log as soon as possible: What you ate. Be sure to include toppings, sauces, and other extras on the food. How much you ate. This can be measured in cups, ounces, or number of items. How many calories were in each food and drink. The total number of calories in the food you ate. Keep your food log near you, such as in a pocket-sized notebook or on an app or website on your mobile phone. Some programs will calculate calories for you and show you how many calories you have left to meet your daily goal. What are some portion-control tips? Know how many calories are in a serving. This will help you know how many servings you can have of a certain   food. Use a measuring cup to measure serving sizes. You could also try weighing out portions on a kitchen scale. With time, you will be able to estimate serving sizes for some foods. Take time to put servings of different foods on your favorite plates or in your favorite bowls and cups so you know what a serving looks like. Try not to eat straight from a food's packaging, such as from a bag or box. Eating straight from the package makes it hard to see how much you are eating and can lead to overeating. Put the amount you would like to eat in a cup or on a plate to make sure you are eating the right portion. Use smaller plates, glasses, and bowls for smaller portions and to prevent  overeating. Try not to multitask. For example, avoid watching TV or using your computer while eating. If it is time to eat, sit down at a table and enjoy your food. This will help you recognize when you are full. It will also help you be more mindful of what and how much you are eating. What are tips for following this plan? Reading food labels Check the calorie count compared with the serving size. The serving size may be smaller than what you are used to eating. Check the source of the calories. Try to choose foods that are high in protein, fiber, and vitamins, and low in saturated fat, trans fat, and sodium. Shopping Read nutrition labels while you shop. This will help you make healthy decisions about which foods to buy. Pay attention to nutrition labels for low-fat or fat-free foods. These foods sometimes have the same number of calories or more calories than the full-fat versions. They also often have added sugar, starch, or salt to make up for flavor that was removed with the fat. Make a grocery list of lower-calorie foods and stick to it. Cooking Try to cook your favorite foods in a healthier way. For example, try baking instead of frying. Use low-fat dairy products. Meal planning Use more fruits and vegetables. One-half of your plate should be fruits and vegetables. Include lean proteins, such as chicken, turkey, and fish. Lifestyle Each week, aim to do one of the following: 150 minutes of moderate exercise, such as walking. 75 minutes of vigorous exercise, such as running. General information Know how many calories are in the foods you eat most often. This will help you calculate calorie counts faster. Find a way of tracking calories that works for you. Get creative. Try different apps or programs if writing down calories does not work for you. What foods should I eat?  Eat nutritious foods. It is better to have a nutritious, high-calorie food, such as an avocado, than a food with  few nutrients, such as a bag of potato chips. Use your calories on foods and drinks that will fill you up and will not leave you hungry soon after eating. Examples of foods that fill you up are nuts and nut butters, vegetables, lean proteins, and high-fiber foods such as whole grains. High-fiber foods are foods with more than 5 g of fiber per serving. Pay attention to calories in drinks. Low-calorie drinks include water and unsweetened drinks. The items listed above may not be a complete list of foods and beverages you can eat. Contact a dietitian for more information. What foods should I limit? Limit foods or drinks that are not good sources of vitamins, minerals, or protein or that are high in unhealthy fats. These   include: Candy. Other sweets. Sodas, specialty coffee drinks, alcohol, and juice. The items listed above may not be a complete list of foods and beverages you should avoid. Contact a dietitian for more information. How do I count calories when eating out? Pay attention to portions. Often, portions are much larger when eating out. Try these tips to keep portions smaller: Consider sharing a meal instead of getting your own. If you get your own meal, eat only half of it. Before you start eating, ask for a container and put half of your meal into it. When available, consider ordering smaller portions from the menu instead of full portions. Pay attention to your food and drink choices. Knowing the way food is cooked and what is included with the meal can help you eat fewer calories. If calories are listed on the menu, choose the lower-calorie options. Choose dishes that include vegetables, fruits, whole grains, low-fat dairy products, and lean proteins. Choose items that are boiled, broiled, grilled, or steamed. Avoid items that are buttered, battered, fried, or served with cream sauce. Items labeled as crispy are usually fried, unless stated otherwise. Choose water, low-fat milk,  unsweetened iced tea, or other drinks without added sugar. If you want an alcoholic beverage, choose a lower-calorie option, such as a glass of wine or light beer. Ask for dressings, sauces, and syrups on the side. These are usually high in calories, so you should limit the amount you eat. If you want a salad, choose a garden salad and ask for grilled meats. Avoid extra toppings such as bacon, cheese, or fried items. Ask for the dressing on the side, or ask for olive oil and vinegar or lemon to use as dressing. Estimate how many servings of a food you are given. Knowing serving sizes will help you be aware of how much food you are eating at restaurants. Where to find more information Centers for Disease Control and Prevention: www.cdc.gov [place]. Department of Agriculture: myplate.gov Summary Calorie counting means keeping track of how many calories you eat and drink each day. If you eat fewer calories than your body needs, you should lose weight. A healthy amount of weight to lose per week is usually 1-2 lb (0.5-0.9 kg). This usually means reducing your daily calorie intake by 500-750 calories. The number of calories in a food can be found on a Nutrition Facts label. If a food does not have a Nutrition Facts label, try to look up the calories online or ask your dietitian for help. Use smaller plates, glasses, and bowls for smaller portions and to prevent overeating. Use your calories on foods and drinks that will fill you up and not leave you hungry shortly after a meal. This information is not intended to replace advice given to you by your health care provider. Make sure you discuss any questions you have with your health care provider. Document Revised: 06/29/2019 Document Reviewed: 06/29/2019 Elsevier Patient Education  2023 [employer]  

## 2021-12-25 NOTE — Progress Notes (Signed)
Established Patient Office Visit  Subjective   Patient ID: Nancy Garza, female    DOB: 12-04-1980  Age: 41 y.o. MRN: [id number]  No chief complaint on file.   HPI  Nancy Garza is a 41 y/o female who has history of hypertension presents for routine follow up and lab review. Her cbc done on 12/04/21, RBC was 3.71, Hgb was 7.9 g/dl, MCV is 1f, MCH 21.3 pg, Platelets 468.She reports having a history of Iron deficiency anemia. Her HgbA1c was 5.9%. She states that she's compliant with her medications, denies side effects and continues to make healthy lifestyle changes. Overall, she states that she's doing well and offers no further complaint.  Review of Systems  Constitutional: Negative.   HENT: Negative.    Eyes: Negative.   Respiratory: Negative.    Cardiovascular: Negative.   Neurological: Negative.   Psychiatric/Behavioral: Negative.        Objective:     BP (!) 149/91 (BP Location: Right Arm, Patient Position: Sitting, Cuff Size: Large)   Pulse 75   Wt (!) 345 lb 3.2 oz (156.6 kg)   BMI 55.72 kg/m  BP Readings from Last 3 Encounters:  12/25/21 (!) 149/91  12/04/21 (!) 163/99  09/30/21 (S) (!) 176/105   Wt Readings from Last 3 Encounters:  12/25/21 (!) 345 lb 3.2 oz (156.6 kg)  12/04/21 (!) 349 lb 4.8 oz (158.4 kg)  09/30/21 (!) 353 lb (160.1 kg)    She lost 4 punds in 3 weeks, encouraged to continue on her weight loss regimen.  Physical Exam HENT:     Head: Normocephalic and atraumatic.     Mouth/Throat:     Mouth: Mucous membranes are moist.  Eyes:     Extraocular Movements: Extraocular movements intact.     Conjunctiva/sclera: Conjunctivae normal.     Pupils: Pupils are equal, round, and reactive to light.  Cardiovascular:     Rate and Rhythm: Normal rate and regular rhythm.     Pulses: Normal pulses.     Heart sounds: Normal heart sounds.  Pulmonary:     Effort: Pulmonary effort is normal.     Breath sounds: Normal breath sounds.  Skin:     General: Skin is warm.  Neurological:     General: No focal deficit present.     Mental Status: She is alert and oriented to person, place, and time. Mental status is at baseline.  Psychiatric:        Mood and Affect: Mood normal.        Behavior: Behavior normal.        Thought Content: Thought content normal.        Judgment: Judgment normal.      No results found for any visits on 12/25/21.  Last CBC Lab Results  Component Value Date   WBC 6.0 12/04/2021   HGB 7.9 (L) 12/04/2021   HCT 26.9 (L) 12/04/2021   MCV 73 (L) 12/04/2021   MCH 21.3 (L) 12/04/2021   RDW 17.2 (H) 12/04/2021   PLT 468 (H) 049/20/1007  Last metabolic panel Lab Results  Component Value Date   GLUCOSE 99 12/04/2021   NA 141 12/04/2021   K 3.8 12/04/2021   CL 102 12/04/2021   CO2 24 12/04/2021   BUN 17 12/04/2021   CREATININE 0.81 12/04/2021   EGFR 95 12/04/2021   CALCIUM 9.1 12/04/2021   PROT 7.7 12/04/2021   ALBUMIN 4.3 12/04/2021   LABGLOB 3.4 12/04/2021   AGRATIO 1.3  12/04/2021   BILITOT <0.2 12/04/2021   ALKPHOS 51 12/04/2021   AST 15 12/04/2021   ALT 13 12/04/2021   Last lipids Lab Results  Component Value Date   CHOL 165 12/04/2021   HDL 36 (L) 12/04/2021   LDLCALC 102 (H) 12/04/2021   TRIG 154 (H) 12/04/2021   CHOLHDL 4.6 (H) 12/04/2021   Last hemoglobin A1c Lab Results  Component Value Date   HGBA1C 5.9 (H) 12/04/2021   Last thyroid functions Lab Results  Component Value Date   TSH 2.900 03/03/2019      The ASCVD Risk score (Arnett DK, et al., 2019) failed to calculate for the following reasons:   The 2019 ASCVD risk score is only valid for ages 42 to 66    Assessment & Plan:   1. Benign hypertension - Her blood pressure is improving, her goal should be less than 140/90,will continue on current medication, DASH diet and exercise as tolerated.  2. Anemia, unspecified type - Will check TIBC, and if low possible Hematology referral. - Iron Binding Cap  (TIBC)([facility]); Future - Vitamin D (25 hydroxy); Future  3. Pre-diabetes Her HgbA1c was 5.9%, she states that she will continue on her dietary modification.   Return in about 4 weeks (around 01/22/2022), or if symptoms worsen or fail to improve.    Jazmina Muhlenkamp Jerold Coombe, NP

## 2022-01-01 ENCOUNTER — Other Ambulatory Visit: Payer: Self-pay

## 2022-01-01 DIAGNOSIS — D649 Anemia, unspecified: Secondary | ICD-10-CM

## 2022-01-02 LAB — IRON AND TIBC
Iron Saturation: 3 % — CL (ref 15–55)
Iron: 14 ug/dL — ABNORMAL LOW (ref 27–159)
Total Iron Binding Capacity: 462 ug/dL — ABNORMAL HIGH (ref 250–450)
UIBC: 448 ug/dL — ABNORMAL HIGH (ref 131–425)

## 2022-01-02 LAB — VITAMIN D 25 HYDROXY (VIT D DEFICIENCY, FRACTURES): Vit D, 25-Hydroxy: 12.1 ng/mL — ABNORMAL LOW (ref 30.0–100.0)

## 2022-01-05 ENCOUNTER — Other Ambulatory Visit: Payer: Self-pay

## 2022-01-06 ENCOUNTER — Other Ambulatory Visit: Payer: Self-pay | Admitting: Gerontology

## 2022-01-06 ENCOUNTER — Other Ambulatory Visit: Payer: Self-pay

## 2022-01-06 DIAGNOSIS — D509 Iron deficiency anemia, unspecified: Secondary | ICD-10-CM

## 2022-01-06 DIAGNOSIS — Z8742 Personal history of other diseases of the female genital tract: Secondary | ICD-10-CM

## 2022-01-06 DIAGNOSIS — E559 Vitamin D deficiency, unspecified: Secondary | ICD-10-CM

## 2022-01-06 MED ORDER — FERROUS SULFATE 325 (65 FE) MG PO TABS
325.0000 mg | ORAL_TABLET | Freq: Every day | ORAL | 3 refills | Status: DC
  Filled 2022-01-06: qty 30, 30d supply, fill #0
  Filled 2022-02-04: qty 30, 30d supply, fill #1
  Filled 2022-03-16 – 2022-03-19 (×3): qty 30, 30d supply, fill #2

## 2022-01-06 MED ORDER — VITAMIN D (ERGOCALCIFEROL) 1.25 MG (50000 UNIT) PO CAPS
50000.0000 [IU] | ORAL_CAPSULE | ORAL | 0 refills | Status: DC
  Filled 2022-01-06: qty 12, 84d supply, fill #0

## 2022-01-14 ENCOUNTER — Encounter: Payer: Self-pay | Admitting: Emergency Medicine

## 2022-01-14 ENCOUNTER — Emergency Department: Payer: Self-pay

## 2022-01-14 ENCOUNTER — Other Ambulatory Visit: Payer: Self-pay

## 2022-01-14 ENCOUNTER — Emergency Department
Admission: EM | Admit: 2022-01-14 | Discharge: 2022-01-14 | Disposition: A | Discharge status: Home / Self Care | Payer: Self-pay | Attending: Emergency Medicine | Admitting: Emergency Medicine

## 2022-01-14 DIAGNOSIS — M719 Bursopathy, unspecified: Secondary | ICD-10-CM

## 2022-01-14 DIAGNOSIS — M7072 Other bursitis of hip, left hip: Secondary | ICD-10-CM | POA: Insufficient documentation

## 2022-01-14 DIAGNOSIS — R609 Edema, unspecified: Secondary | ICD-10-CM

## 2022-01-14 DIAGNOSIS — R600 Localized edema: Secondary | ICD-10-CM | POA: Insufficient documentation

## 2022-01-14 DIAGNOSIS — Y9389 Activity, other specified: Secondary | ICD-10-CM | POA: Insufficient documentation

## 2022-01-14 MED ORDER — MELOXICAM 15 MG PO TABS
15.0000 mg | ORAL_TABLET | Freq: Every day | ORAL | 2 refills | Status: DC
  Filled 2022-01-14: qty 30, 30d supply, fill #0

## 2022-01-14 MED ORDER — KETOROLAC TROMETHAMINE 30 MG/ML IJ SOLN
30.0000 mg | Freq: Once | INTRAMUSCULAR | Status: AC
  Administered 2022-01-14: 30 mg via INTRAMUSCULAR
  Filled 2022-01-14: qty 1

## 2022-01-14 NOTE — ED Notes (Signed)
Right pedal pulse palpated with ease, pt states the ball of her right foot is tender since Monday, pt also states that her left hip has been hurting since she started working out

## 2022-01-14 NOTE — ED Triage Notes (Signed)
Patient c/o left hip x 1 month. States she started working out and pain is worse when she is sitting down and becomes stiff. Also having right foot pain since Monday night. States she has to walk on the side of her foot due to pain.

## 2022-01-14 NOTE — Discharge Instructions (Signed)
Follow-up with your regular doctor as needed.  Follow-up with orthopedics if not improving in 1 week.  If you continue to have right lower leg swelling and it is worsening you will need a repeat ultrasound in 1 week.  Return emergency department if worsening

## 2022-01-14 NOTE — ED Provider Notes (Signed)
[facility] Provider Note    Event Date/Time   First MD Initiated Contact with Patient 01/14/22 [phone]     (approximate)   History   Hip Pain   HPI  Nancy Garza is a 41 y.o. female with history of morbid obesity, hypertension presents emergency department with complaints of left hip pain and right leg swelling.  Patient states he started exercising now has pain in the right foot along with swelling into the lower extremity.  Also complains of left hip pain.  Increased pain with movement.  No specific injury.      Physical Exam   Triage Vital Signs: ED Triage Vitals  Enc Vitals Group     BP 01/14/22 0858 (!) 175/102     Pulse Rate 01/14/22 0858 67     Resp 01/14/22 0858 20     Temp 01/14/22 0858 97.9 F (36.6 C)     Temp Source 01/14/22 0858 Oral     SpO2 01/14/22 0858 99 %     Weight 01/14/22 0902 (!) 340 lb (154.2 kg)     Height 01/14/22 0902 5\' 7"  (1.702 m)     Head Circumference --      Peak Flow --      Pain Score 01/14/22 0902 7     Pain Loc --      Pain Edu? --      Excl. in GC? --     Most recent vital signs: Vitals:   01/14/22 0858 01/14/22 0906  BP: (!) 175/102 (!) 172/98  Pulse: 67   Resp: 20   Temp: 97.9 F (36.6 C)   SpO2: 99%      General: Awake, no distress.   CV:  Good peripheral perfusion. regular rate and  rhythm Resp:  Normal effort.  Abd:  No distention.   Other:  2+ pitting edema noted to the right leg, minimal swelling noted on the left, left hip is tender to palpation at the lateral aspect more so along the trochanteric bursa, pain is reproduced with range of motion, neurovascular is intact bilaterally   ED Results / Procedures / Treatments   Labs (all labs ordered are listed, but only abnormal results are displayed) Labs Reviewed - No data to display   EKG     RADIOLOGY X-ray of the left hip, ultrasound right lower extremity    PROCEDURES:   Procedures   MEDICATIONS ORDERED IN  ED: Medications  ketorolac (TORADOL) 30 MG/ML injection 30 mg (30 mg Intramuscular Given 01/14/22 1024)     IMPRESSION / MDM / ASSESSMENT AND PLAN / ED COURSE  I reviewed the triage vital signs and the nursing notes.                              Differential diagnosis includes, but is not limited to, DVT, bursitis, plantar fasciitis, muscle strain  Patient's presentation is most consistent with acute presentation with potential threat to life or bodily function.   Due to concerns of DVT, ultrasound right lower extremity was ordered.  Ultrasound right lower extremity was independently reviewed and interpreted by me as being negative for DVT.  X-ray of the left hip was individually reviewed and interpreted by me as being negative for fracture  All of these results were discussed with patient.  She was cautioned that if the right leg continues to swell or it is getting worse she will need repeat ultrasound  in 1 week.  She is to follow-up with orthopedics if not improving 1 week.  She was given Toradol 30 mg IM while here in the ED.  She will be given meloxicam 15 mg daily.  She was given a work note discharge stable condition.      FINAL CLINICAL IMPRESSION(S) / ED DIAGNOSES   Final diagnoses:  Acute bursitis  Edema, unspecified type     Rx / DC Orders   ED Discharge Orders          Ordered    meloxicam (MOBIC) 15 MG tablet  Daily        01/14/22 1025             Note:  This document was prepared using Dragon voice recognition software and may include unintentional dictation errors.    Faythe Ghee, PA-C 01/14/22 1030    Pilar Jarvis, MD 01/14/22 [phone]

## 2022-01-14 NOTE — ED Notes (Signed)
Pt taken to xray via stretcher  

## 2022-01-22 ENCOUNTER — Ambulatory Visit: Payer: Self-pay | Admitting: Gerontology

## 2022-01-22 VITALS — BP 160/98 | HR 78 | Wt 349.5 lb

## 2022-01-22 DIAGNOSIS — I10 Essential (primary) hypertension: Secondary | ICD-10-CM

## 2022-01-22 DIAGNOSIS — K5900 Constipation, unspecified: Secondary | ICD-10-CM

## 2022-01-22 DIAGNOSIS — M7989 Other specified soft tissue disorders: Secondary | ICD-10-CM

## 2022-01-22 MED ORDER — BLOOD PRESSURE KIT
1.0000 | PACK | Freq: Every day | 0 refills | Status: DC
  Filled 2022-01-22: qty 1, fill #0

## 2022-01-22 MED ORDER — LOSARTAN POTASSIUM 25 MG PO TABS
25.0000 mg | ORAL_TABLET | Freq: Every day | ORAL | 0 refills | Status: DC
  Filled 2022-01-22 – 2022-02-04 (×2): qty 30, 30d supply, fill #0

## 2022-01-22 MED ORDER — SENNA 8.6 MG PO TABS
1.0000 | ORAL_TABLET | Freq: Every day | ORAL | 3 refills | Status: DC
  Filled 2022-01-22: qty 30, 30d supply, fill #0

## 2022-01-22 NOTE — Progress Notes (Signed)
 Established Patient Office Visit  Subjective   Patient ID: Nancy Garza, female    DOB: 05/18/1981  Age: 40 y.o. MRN: [id number]  No chief complaint on file.   HPI  Zephyra K Welke is a 40 y/o female who has history of hypertension presents for routine visit. She was seen at the ED on 01/14/22 for bilateral leg swelling, and left hip pain. Ultra sound to right lower extremity was done and it was negative for DVT. Imaging to left hip was negative .Currently, she c/o +1 swelling to bilateral lower leg and ankle.  She  denies claudication. She states that swelling started when she resumed taking Nifedipine, though she has been on the medication for more than 3 years. 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 Her blood pressure is not controlled, she does not check bp at home  because she has no blood pressure machine. Her Vitamin D was 12.1 ng/ml and did not pick up her ergocalciferol from the pharmacy. Her TIBC  done on 01/01/22, iron was 14 ug/dl and iron saturation was 3%, tolerates ferrous sulfate, but c/o constipation.  She declines Hematology referral and was started on Ferrous sulfate. Overall, she states that she's doing well and offers no further complaint.  Review of Systems  Constitutional: Negative.   HENT: Negative.    Respiratory: Negative.    Cardiovascular:  Positive for leg swelling.  Neurological: Negative.   Psychiatric/Behavioral: Negative.        Objective:     BP (!) 160/98 (BP Location: Left Arm, Patient Position: Sitting, Cuff Size: Large)   Pulse 78   Wt (!) 349 lb 8 oz (158.5 kg)   LMP 09/27/2021 (Exact Date) Comment: waiver signed  BMI 54.74 kg/m  BP Readings from Last 3 Encounters:  01/22/22 (!) 160/98  01/14/22 (!) 172/98  12/25/21 (!) 149/91   Wt Readings from Last 3 Encounters:  01/22/22 (!) 349 lb 8 oz (158.5 kg)  01/14/22 (!) 340 lb (154.2 kg)  12/25/21 (!) 345 lb 3.2 oz (156.6 kg)      Physical Exam HENT:     Head: Normocephalic  and atraumatic.     Mouth/Throat:     Mouth: Mucous membranes are moist.  Eyes:     Extraocular Movements: Extraocular movements intact.     Conjunctiva/sclera: Conjunctivae normal.     Pupils: Pupils are equal, round, and reactive to light.  Cardiovascular:     Rate and Rhythm: Normal rate and regular rhythm.     Pulses: Normal pulses.     Heart sounds: Normal heart sounds.  Pulmonary:     Effort: Pulmonary effort is normal.     Breath sounds: Normal breath sounds.  Musculoskeletal:        General: Swelling (+1 swelling to bilateral lower extremity) present.  Skin:    General: Skin is warm.  Neurological:     General: No focal deficit present.     Mental Status: She is alert and oriented to person, place, and time. Mental status is at baseline.  Psychiatric:        Mood and Affect: Mood normal.        Behavior: Behavior normal.        Thought Content: Thought content normal.        Judgment: Judgment normal.      No results found for any visits on 01/22/22.  Last CBC Lab Results  Component Value Date   WBC 6.0 12/04/2021     HGB 7.9 (L) 12/04/2021   HCT 26.9 (L) 12/04/2021   MCV 73 (L) 12/04/2021   MCH 21.3 (L) 12/04/2021   RDW 17.2 (H) 12/04/2021   PLT 468 (H) 12/04/2021   Last metabolic panel Lab Results  Component Value Date   GLUCOSE 99 12/04/2021   NA 141 12/04/2021   K 3.8 12/04/2021   CL 102 12/04/2021   CO2 24 12/04/2021   BUN 17 12/04/2021   CREATININE 0.81 12/04/2021   EGFR 95 12/04/2021   CALCIUM 9.1 12/04/2021   PROT 7.7 12/04/2021   ALBUMIN 4.3 12/04/2021   LABGLOB 3.4 12/04/2021   AGRATIO 1.3 12/04/2021   BILITOT <0.2 12/04/2021   ALKPHOS 51 12/04/2021   AST 15 12/04/2021   ALT 13 12/04/2021   Last lipids Lab Results  Component Value Date   CHOL 165 12/04/2021   HDL 36 (L) 12/04/2021   LDLCALC 102 (H) 12/04/2021   TRIG 154 (H) 12/04/2021   CHOLHDL 4.6 (H) 12/04/2021   Last hemoglobin A1c Lab Results  Component Value Date    HGBA1C 5.9 (H) 12/04/2021      The ASCVD Risk score (Arnett DK, et al., 2019) failed to calculate for the following reasons:   The 2019 ASCVD risk score is only valid for ages 40 to 79    Assessment & Plan:   1. Constipation, unspecified constipation type - Constipation is likely due to Ferrous sulfate, was started on Senokot and advised to increase  dietary fiber. - senna (SENOKOT) 8.6 MG TABS tablet; Take 1 tablet (8.6 mg total) by mouth daily.  Dispense: 30 tablet; Refill: 3  2. Benign hypertension - Her blood pressure is not controlled, her goal should be less than 140/90. She was started on 25 mg Losartan, educated on medication side effects and advised to notify clinic. She was provided with bp machine, advised to check daily, record and bring log to follow up visit. She will continue on DASH diet and exercise as tolerated. - losartan (COZAAR) 25 MG tablet; Take 1 tablet (25 mg total) by mouth daily.  Dispense: 30 tablet; Refill: 0 - Blood Pressure KIT; 1 kit by Does not apply route daily.  Dispense: 1 kit; Refill: 0  3. Leg swelling - Unknown etiology of swelling, Nifedipine was discontinued for possible side effect of swelling. She was advised to wear compression stockings, elevate legs while sitting down and notify clinic for worsening symptoms.   Return in about 4 weeks (around 02/19/2022), or if symptoms worsen or fail to improve.    Chioma E Iloabachie, NP  

## 2022-01-22 NOTE — Patient Instructions (Signed)
Fiber Content in Foods Fiber is a substance that is found in plant foods, such as fruits, vegetables, whole grains, nuts, seeds, and beans. As part of your treatment and recovery plan, your health care provider may recommend that you eat foods that have specific amounts of dietary fiber. Some conditions may require a high-fiber diet while others may require a low-fiber diet. This sheet gives you information about the dietary fiber content of some common foods. Your health care provider will tell you how much fiber you need in your diet. If you have problems or questions, contact your health care provider or dietitian. What foods are high in fiber?  Fruits Blackberries or raspberries (fresh) --  cup (75 g) has 4 g of fiber. Pear (fresh) -- 1 medium (180 g) has 5.5 g of fiber. Prunes (dried) -- 6 to 8 pieces (57-76 g) has 5 g of fiber. Apple with skin -- 1 medium (182 g) has 4.8 g of fiber. Guava -- 1 cup (128 g) has 8.9 g of fiber. Vegetables Peas (frozen) --  cup (80 g) has 4.4 g of fiber. Potato with skin (baked) -- 1 medium (173 g) has 4.4 g of fiber. Pumpkin (canned) --  cup (122 g) has 5 g of fiber. Brussels sprouts (cooked) --  cup (78 g) has 4 g of fiber. Sweet potato --  cup mashed (124 g) has 4 g of fiber. Winter squash -- 1 cup cooked (205 g) has 5.7 g of fiber. Grains Bran cereal --  cup (31 g) has 8.6 g of fiber. Bulgur (cooked) --  cup (70 g) has 4 g of fiber. Quinoa (cooked) -- 1 cup (185 g) has 5.2 g of fiber. Popcorn -- 3 cups (375 g) popped has 5.8 g of fiber. Spaghetti, whole wheat -- 1 cup (140 g) has 6 g of fiber. Meats and other proteins Pinto beans (cooked) --  cup (90 g) has 7.7 g of fiber. Lentils (cooked) --  cup (90 g) has 7.8 g of fiber. Kidney beans (canned) --  cup (92.5 g) has 5.7 g of fiber. Soybeans (canned, frozen, or fresh) --  cup (92.5 g) has 5.2 g of fiber. Baked beans, plain or vegetarian (canned) --  cup (130 g) has 5.2 g of  fiber. Garbanzo beans or chickpeas (canned) --  cup (90 g) has 6.6 g of fiber. Black beans (cooked) --  cup (86 g) has 7.5 g of fiber. White beans or navy beans (cooked) --  cup (91 g) has 9.3 g of fiber. The items listed above may not be a complete list of foods with high fiber. Actual amounts of fiber may be different depending on processing. Contact a dietitian for more information. What foods are moderate in fiber?  Fruits Banana -- 1 medium (126 g) has 3.2 g of fiber. Melon -- 1 cup (155 g) has 1.4 g of fiber. Orange -- 1 small (154 g) has 3.7 g of fiber. Raisins --  cup (40 g) has 1.8 g of fiber. Applesauce, sweetened --  cup (125 g) has 1.5 g of fiber. Blueberries (fresh) --  cup (75 g) has 1.8 g of fiber. Strawberries (fresh, sliced) -- 1 cup (150 g) has 3 g of fiber. Cherries -- 1 cup (140 g) has 2.9 g of fiber. Vegetables Broccoli (cooked) --  cup (77.5 g) has 2.1 g of fiber. Carrots (cooked) --  cup (77.5 g) has 2.2 g of fiber. Corn (canned or frozen) --  cup (82.5 g)   has 2.1 g of fiber. Potatoes, mashed --  cup (105 g) has 1.6 g of fiber. Tomato -- 1 medium (62 g) has 1.5 g of fiber. Green beans (canned) --  cup (83 g) has 2 g of fiber. Squash, winter --  cup (58 g) has 1 g of fiber. Sweet potato, baked -- 1 medium (150 g) has 3 g of fiber. Cauliflower (cooked) -- 1/2 cup (90 g) has 2.3 g of fiber. Grains Long-grain brown rice (cooked) -- 1 cup (196 g) has 3.5 g of fiber. Bagel, plain -- one 4-inch (10 cm) bagel has 2 g of fiber. Instant oatmeal --  cup (120 g) has about 2 g of fiber. Macaroni noodles, enriched (cooked) -- 1 cup (140 g) has 2.5 g of fiber. Multigrain cereal --  cup (15 g) has about 2-4 g of fiber. Whole-wheat bread -- 1 slice (26 g) has 2 g of fiber. Whole-wheat spaghetti noodles --  cup (70 g) has 3.2 g of fiber. Corn tortilla -- one 6-inch (15 cm) tortilla has 1.5 g of fiber. Meats and other proteins Almonds --  cup or 1 oz (28 g) has  3.5 g of fiber. Sunflower seeds in shell --  cup or  oz (11.5 g) has 1.1 g of fiber. Vegetable or soy patty -- 1 patty (70 g) has 3.4 g of fiber. Walnuts --  cup or 1 oz (30 g) has 2 g of fiber. Flax seed -- 1 Tbsp (7 g) has 2.8 g of fiber. The items listed above may not be a complete list of foods that have moderate amounts of fiber. Actual amounts of fiber may be different depending on processing. Contact a dietitian for more information. What foods are low in fiber?  Low-fiber foods contain less than 1 g of fiber per serving. They include: Fruits Fruit juice --  cup or 4 fl oz (118 mL) has 0.5 g of fiber. Vegetables Lettuce -- 1 cup (35 g) has 0.5 g of fiber. Cucumber (slices) --  cup (60 g) has 0.3 g of fiber. Celery -- 1 stalk (40 g) has 0.1 g of fiber. Grains Flour tortilla -- one 6-inch (15 cm) tortilla has 0.5 g of fiber. White rice (cooked) --  cup (81.5 g) has 0.3 g of fiber. Meats and other proteins Egg -- 1 large (50 g) has 0 g of fiber. Meat, poultry, or fish -- 3 oz (85 g) has 0 g of fiber. Dairy Milk -- 1 cup or 8 fl oz (237 mL) has 0 g of fiber. Yogurt -- 1 cup (245 g) has 0 g of fiber. The items listed above may not be a complete list of foods that are low in fiber. Actual amounts of fiber may be different depending on processing. Contact a dietitian for more information. Summary Fiber is a substance that is found in plant foods, such as fruits, vegetables, whole grains, nuts, seeds, and beans. As part of your treatment and recovery plan, your health care provider may recommend that you eat foods that have specific amounts of dietary fiber. This information is not intended to replace advice given to you by your health care provider. Make sure you discuss any questions you have with your health care provider. Document Revised: 09/21/2019 Document Reviewed: 09/21/2019 Elsevier Patient Education  2023 [employer] DASH Eating Plan DASH stands for Dietary  Approaches to Stop Hypertension. The DASH eating plan is a healthy eating plan that has been shown to: Reduce high blood pressure (  hypertension). Reduce your risk for type 2 diabetes, heart disease, and stroke. Help with weight loss. What are tips for following this plan? Reading food labels Check food labels for the amount of salt (sodium) per serving. Choose foods with less than 5 percent of the Daily Value of sodium. Generally, foods with less than 300 milligrams (mg) of sodium per serving fit into this eating plan. To find whole grains, look for the word "whole" as the first word in the ingredient list. Shopping Buy products labeled as "low-sodium" or "no salt added." Buy fresh foods. Avoid canned foods and pre-made or frozen meals. Cooking Avoid adding salt when cooking. Use salt-free seasonings or herbs instead of table salt or sea salt. Check with your health care provider or pharmacist before using salt substitutes. Do not fry foods. Cook foods using healthy methods such as baking, boiling, grilling, roasting, and broiling instead. Cook with heart-healthy oils, such as olive, canola, avocado, soybean, or sunflower oil. Meal planning  Eat a balanced diet that includes: 4 or more servings of fruits and 4 or more servings of vegetables each day. Try to fill one-half of your plate with fruits and vegetables. 6-8 servings of whole grains each day. Less than 6 oz (170 g) of lean meat, poultry, or fish each day. A 3-oz (85-g) serving of meat is about the same size as a deck of cards. One egg equals 1 oz (28 g). 2-3 servings of low-fat dairy each day. One serving is 1 cup (237 mL). 1 serving of nuts, seeds, or beans 5 times each week. 2-3 servings of heart-healthy fats. Healthy fats called omega-3 fatty acids are found in foods such as walnuts, flaxseeds, fortified milks, and eggs. These fats are also found in cold-water fish, such as sardines, salmon, and mackerel. Limit how much you eat  of: Canned or prepackaged foods. Food that is high in trans fat, such as some fried foods. Food that is high in saturated fat, such as fatty meat. Desserts and other sweets, sugary drinks, and other foods with added sugar. Full-fat dairy products. Do not salt foods before eating. Do not eat more than 4 egg yolks a week. Try to eat at least 2 vegetarian meals a week. Eat more home-cooked food and less restaurant, buffet, and fast food. Lifestyle When eating at a restaurant, ask that your food be prepared with less salt or no salt, if possible. If you drink alcohol: Limit how much you use to: 0-1 drink a day for women who are not pregnant. 0-2 drinks a day for men. Be aware of how much alcohol is in your drink. In the [place] one drink equals one 12 oz bottle of beer (355 mL), one 5 oz glass of wine (148 mL), or one 1 oz glass of hard liquor (44 mL). General information Avoid eating more than 2,300 mg of salt a day. If you have hypertension, you may need to reduce your sodium intake to 1,500 mg a day. Work with your health care provider to maintain a healthy body weight or to lose weight. Ask what an ideal weight is for you. Get at least 30 minutes of exercise that causes your heart to beat faster (aerobic exercise) most days of the week. Activities may include walking, swimming, or biking. Work with your health care provider or dietitian to adjust your eating plan to your individual calorie needs. What foods should I eat? Fruits All fresh, dried, or frozen fruit. Canned fruit in natural juice (without added sugar).  Vegetables Fresh or frozen vegetables (raw, steamed, roasted, or grilled). Low-sodium or reduced-sodium tomato and vegetable juice. Low-sodium or reduced-sodium tomato sauce and tomato paste. Low-sodium or reduced-sodium canned vegetables. Grains Whole-grain or whole-wheat bread. Whole-grain or whole-wheat pasta. Brown rice. Orpah Cobb. Bulgur. Whole-grain and low-sodium  cereals. Pita bread. Low-fat, low-sodium crackers. Whole-wheat flour tortillas. Meats and other proteins Skinless chicken or [place]. Ground chicken or [place]. Pork with fat trimmed off. Fish and seafood. Egg whites. Dried beans, peas, or lentils. Unsalted nuts, nut butters, and seeds. Unsalted canned beans. Lean cuts of beef with fat trimmed off. Low-sodium, lean precooked or cured meat, such as sausages or meat loaves. Dairy Low-fat (1%) or fat-free (skim) milk. Reduced-fat, low-fat, or fat-free cheeses. Nonfat, low-sodium ricotta or cottage cheese. Low-fat or nonfat yogurt. Low-fat, low-sodium cheese. Fats and oils Soft margarine without trans fats. Vegetable oil. Reduced-fat, low-fat, or light mayonnaise and salad dressings (reduced-sodium). Canola, safflower, olive, avocado, soybean, and sunflower oils. Avocado. Seasonings and condiments Herbs. Spices. Seasoning mixes without salt. Other foods Unsalted popcorn and pretzels. Fat-free sweets. The items listed above may not be a complete list of foods and beverages you can eat. Contact a dietitian for more information. What foods should I avoid? Fruits Canned fruit in a light or heavy syrup. Fried fruit. Fruit in cream or butter sauce. Vegetables Creamed or fried vegetables. Vegetables in a cheese sauce. Regular canned vegetables (not low-sodium or reduced-sodium). Regular canned tomato sauce and paste (not low-sodium or reduced-sodium). Regular tomato and vegetable juice (not low-sodium or reduced-sodium). Rosita Fire. Olives. Grains Baked goods made with fat, such as croissants, muffins, or some breads. Dry pasta or rice meal packs. Meats and other proteins Fatty cuts of meat. Ribs. Fried meat. Tomasa Blase. Bologna, salami, and other precooked or cured meats, such as sausages or meat loaves. Fat from the back of a pig (fatback). Bratwurst. Salted nuts and seeds. Canned beans with added salt. Canned or smoked fish. Whole eggs or egg yolks. Chicken or  [place] with skin. Dairy Whole or 2% milk, cream, and half-and-half. Whole or full-fat cream cheese. Whole-fat or sweetened yogurt. Full-fat cheese. Nondairy creamers. Whipped toppings. Processed cheese and cheese spreads. Fats and oils Butter. Stick margarine. Lard. Shortening. Ghee. Bacon fat. Tropical oils, such as coconut, palm kernel, or palm oil. Seasonings and condiments Onion salt, garlic salt, seasoned salt, table salt, and sea salt. Worcestershire sauce. Tartar sauce. Barbecue sauce. Teriyaki sauce. Soy sauce, including reduced-sodium. Steak sauce. Canned and packaged gravies. Fish sauce. Oyster sauce. Cocktail sauce. Store-bought horseradish. Ketchup. Mustard. Meat flavorings and tenderizers. Bouillon cubes. Hot sauces. Pre-made or packaged marinades. Pre-made or packaged taco seasonings. Relishes. Regular salad dressings. Other foods Salted popcorn and pretzels. The items listed above may not be a complete list of foods and beverages you should avoid. Contact a dietitian for more information. Where to find more information National Heart, Lung, and Blood Institute: PopSteam.is American Heart Association: www.heart.org Academy of Nutrition and Dietetics: www.eatright.org National Kidney Foundation: www.kidney.org Summary The DASH eating plan is a healthy eating plan that has been shown to reduce high blood pressure (hypertension). It may also reduce your risk for type 2 diabetes, heart disease, and stroke. When on the DASH eating plan, aim to eat more fresh fruits and vegetables, whole grains, lean proteins, low-fat dairy, and heart-healthy fats. With the DASH eating plan, you should limit salt (sodium) intake to 2,300 mg a day. If you have hypertension, you may need to reduce your sodium intake to 1,500 mg a  day. Work with your health care provider or dietitian to adjust your eating plan to your individual calorie needs. This information is not intended to replace advice given to  you by your health care provider. Make sure you discuss any questions you have with your health care provider. Document Revised: 04/21/2019 Document Reviewed: 04/21/2019 Elsevier Patient Education  2023 [employer].

## 2022-01-23 ENCOUNTER — Other Ambulatory Visit: Payer: Self-pay

## 2022-01-29 ENCOUNTER — Encounter: Payer: Self-pay | Admitting: Cardiology

## 2022-01-29 ENCOUNTER — Ambulatory Visit: Payer: Self-pay | Attending: Cardiology | Admitting: Cardiology

## 2022-02-04 ENCOUNTER — Other Ambulatory Visit: Payer: Self-pay

## 2022-02-13 ENCOUNTER — Other Ambulatory Visit: Payer: Self-pay

## 2022-02-19 ENCOUNTER — Ambulatory Visit: Payer: Self-pay

## 2022-03-10 ENCOUNTER — Encounter: Payer: Self-pay | Admitting: Obstetrics and Gynecology

## 2022-03-16 ENCOUNTER — Other Ambulatory Visit: Payer: Self-pay | Admitting: Gerontology

## 2022-03-16 ENCOUNTER — Other Ambulatory Visit: Payer: Self-pay

## 2022-03-16 DIAGNOSIS — I10 Essential (primary) hypertension: Secondary | ICD-10-CM

## 2022-03-16 MED FILL — Metoprolol Succinate Tab ER 24HR 100 MG (Tartrate Equiv): ORAL | 30 days supply | Qty: 30 | Fill #0 | Status: AC

## 2022-03-16 MED FILL — Losartan Potassium Tab 25 MG: ORAL | 30 days supply | Qty: 30 | Fill #0 | Status: AC

## 2022-03-18 ENCOUNTER — Other Ambulatory Visit: Payer: Self-pay

## 2022-03-19 ENCOUNTER — Other Ambulatory Visit: Payer: Self-pay

## 2022-03-22 ENCOUNTER — Other Ambulatory Visit: Payer: Self-pay

## 2022-03-25 ENCOUNTER — Other Ambulatory Visit: Payer: Self-pay

## 2022-04-16 ENCOUNTER — Other Ambulatory Visit: Payer: Self-pay

## 2022-04-16 ENCOUNTER — Telehealth: Payer: Self-pay | Admitting: Physician Assistant

## 2022-04-16 DIAGNOSIS — K047 Periapical abscess without sinus: Secondary | ICD-10-CM

## 2022-04-16 MED ORDER — AMOXICILLIN-POT CLAVULANATE 875-125 MG PO TABS
1.0000 | ORAL_TABLET | Freq: Two times a day (BID) | ORAL | 0 refills | Status: DC
  Filled 2022-04-16: qty 20, 10d supply, fill #0

## 2022-04-16 MED ORDER — NAPROXEN 500 MG PO TABS
500.0000 mg | ORAL_TABLET | Freq: Two times a day (BID) | ORAL | 0 refills | Status: DC
  Filled 2022-04-16: qty 30, 15d supply, fill #0

## 2022-04-16 NOTE — Progress Notes (Signed)
I have spent 5 minutes in review of e-visit questionnaire, review and updating patient chart, medical decision making and response to patient.   Demir Titsworth Cody Madinah Quarry, PA-C    

## 2022-04-16 NOTE — Progress Notes (Signed)

## 2022-04-30 ENCOUNTER — Other Ambulatory Visit: Payer: Self-pay

## 2022-04-30 ENCOUNTER — Other Ambulatory Visit: Payer: Self-pay | Admitting: Gerontology

## 2022-04-30 DIAGNOSIS — I10 Essential (primary) hypertension: Secondary | ICD-10-CM

## 2022-04-30 MED ORDER — LOSARTAN POTASSIUM 25 MG PO TABS
25.0000 mg | ORAL_TABLET | Freq: Every day | ORAL | 0 refills | Status: DC
  Filled 2022-04-30: qty 30, 30d supply, fill #0

## 2022-04-30 MED FILL — Metoprolol Succinate Tab ER 24HR 100 MG (Tartrate Equiv): ORAL | 30 days supply | Qty: 30 | Fill #1 | Status: AC

## 2022-06-11 ENCOUNTER — Other Ambulatory Visit: Payer: Self-pay | Admitting: Gerontology

## 2022-06-11 DIAGNOSIS — I10 Essential (primary) hypertension: Secondary | ICD-10-CM

## 2022-06-11 MED FILL — Metoprolol Succinate Tab ER 24HR 100 MG (Tartrate Equiv): ORAL | 30 days supply | Qty: 30 | Fill #2 | Status: AC

## 2022-06-12 ENCOUNTER — Other Ambulatory Visit: Payer: Self-pay | Admitting: Gerontology

## 2022-06-12 ENCOUNTER — Other Ambulatory Visit: Payer: Self-pay

## 2022-06-12 DIAGNOSIS — I10 Essential (primary) hypertension: Secondary | ICD-10-CM

## 2022-06-12 MED FILL — Losartan Potassium Tab 25 MG: ORAL | 30 days supply | Qty: 30 | Fill #0 | Status: AC

## 2022-06-12 NOTE — Telephone Encounter (Signed)
Patient will need to get additional refills from new PCP. Appointment on 06/29/22.

## 2022-06-29 ENCOUNTER — Encounter: Payer: Self-pay | Admitting: Nurse Practitioner

## 2022-06-29 ENCOUNTER — Other Ambulatory Visit (HOSPITAL_COMMUNITY)
Admission: RE | Admit: 2022-06-29 | Discharge: 2022-06-29 | Disposition: A | Discharge status: Home / Self Care | Payer: Commercial Managed Care - PPO | Source: Ambulatory Visit | Attending: Nurse Practitioner | Admitting: Nurse Practitioner

## 2022-06-29 ENCOUNTER — Ambulatory Visit (INDEPENDENT_AMBULATORY_CARE_PROVIDER_SITE_OTHER): Payer: Commercial Managed Care - PPO | Admitting: Nurse Practitioner

## 2022-06-29 ENCOUNTER — Other Ambulatory Visit: Payer: Self-pay

## 2022-06-29 VITALS — BP 160/87 | HR 71 | Temp 98.4°F | Ht 66.7 in | Wt 357.2 lb

## 2022-06-29 DIAGNOSIS — I10 Essential (primary) hypertension: Secondary | ICD-10-CM

## 2022-06-29 DIAGNOSIS — F419 Anxiety disorder, unspecified: Secondary | ICD-10-CM

## 2022-06-29 DIAGNOSIS — D649 Anemia, unspecified: Secondary | ICD-10-CM

## 2022-06-29 DIAGNOSIS — Z1231 Encounter for screening mammogram for malignant neoplasm of breast: Secondary | ICD-10-CM | POA: Diagnosis not present

## 2022-06-29 DIAGNOSIS — Z1159 Encounter for screening for other viral diseases: Secondary | ICD-10-CM

## 2022-06-29 DIAGNOSIS — Z0000 Encounter for general adult medical examination without abnormal findings: Secondary | ICD-10-CM | POA: Diagnosis not present

## 2022-06-29 DIAGNOSIS — F32A Depression, unspecified: Secondary | ICD-10-CM

## 2022-06-29 DIAGNOSIS — R7303 Prediabetes: Secondary | ICD-10-CM | POA: Diagnosis not present

## 2022-06-29 DIAGNOSIS — Z136 Encounter for screening for cardiovascular disorders: Secondary | ICD-10-CM | POA: Diagnosis not present

## 2022-06-29 LAB — URINALYSIS, ROUTINE W REFLEX MICROSCOPIC
Bilirubin, UA: NEGATIVE
Glucose, UA: NEGATIVE
Ketones, UA: NEGATIVE
Leukocytes,UA: NEGATIVE
Nitrite, UA: NEGATIVE
Protein,UA: NEGATIVE
RBC, UA: NEGATIVE
Specific Gravity, UA: 1.025 (ref 1.005–1.030)
Urobilinogen, Ur: 0.2 mg/dL (ref 0.2–1.0)
pH, UA: 6.5 (ref 5.0–7.5)

## 2022-06-29 MED ORDER — CITALOPRAM HYDROBROMIDE 10 MG PO TABS
10.0000 mg | ORAL_TABLET | Freq: Every day | ORAL | 0 refills | Status: DC
  Filled 2022-06-29: qty 30, 30d supply, fill #0

## 2022-06-29 MED ORDER — AMLODIPINE BESYLATE 5 MG PO TABS
5.0000 mg | ORAL_TABLET | Freq: Every day | ORAL | 0 refills | Status: DC
  Filled 2022-06-29: qty 90, 90d supply, fill #0

## 2022-06-29 NOTE — Assessment & Plan Note (Signed)
Chronic. Not well controlled.  Will start Celexa 10mg  daily.  Side effects and benefits of medication discussed during visit today.  Labs ordered.  Follow up in 1 month.  Call sooner if concerns arise.

## 2022-06-29 NOTE — Assessment & Plan Note (Signed)
Chronic. Last Hemoglobin in July was 7.9%. Patient has very heavy periods.  Labs ordered today. Will make recommendations based on labs.

## 2022-06-29 NOTE — Progress Notes (Signed)
BP (!) 160/87   Pulse 71   Temp 98.4 F (36.9 C) (Oral)   Ht 5' 6.7" (1.694 m)   Wt (!) 357 lb 3.2 oz (162 kg)   LMP 06/13/2022 (Exact Date)   SpO2 98%   BMI 56.45 kg/m    Subjective:    Patient ID: Nancy Garza, female    DOB: 12-28-1980, 42 y.o.   MRN: [id number]  HPI: Nancy Garza is a 42 y.o. female presenting on 06/29/2022 for comprehensive medical examination. Current medical complaints include: period concern  She currently lives with: Menopausal Symptoms: no  HYPERTENSION without Chronic Kidney Disease Hypertension status: controlled  Satisfied with current treatment? yes Duration of hypertension: years BP monitoring frequency:  not checking BP range:  BP medication side effects:  no Medication compliance: excellent compliance Previous BP meds: metoprolol  and losartan (cozaar) Aspirin: no Recurrent headaches: no Visual changes: no Palpitations: no Dyspnea: no Chest pain: no Lower extremity edema: no Dizzy/lightheaded: no  ANEMIA Anemia status: controlled Etiology of anemia: iDA Duration of anemia treatment:  Compliance with treatment: excellent compliance Iron supplementation side effects: yes Severity of anemia: severe Fatigue: no Decreased exercise tolerance: yes  Dyspnea on exertion: yes Palpitations: no Bleeding: yes- heavy periods Pica: no  Patient states she is concerned about her periods.  They are extremely heaving, she has to wear a pull up for 3 days.  She bleeds for a whole 7 days.  After the 7 days she has a mucousy discharge for about 4 days after her cycle.    MOOD Patient states she feels like she is doing well.  She states she is extremely irritable.  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  Denies SI.   Depression Screen done today and results listed below:     06/29/2022    3:21 PM 01/04/2020    2:24 PM 12/21/2019    3:09 PM 12/07/2019    9:16 AM 03/03/2019    1:10 PM  Depression screen PHQ 2/9  Decreased Interest 0  3 3 3  0  Down, Depressed, Hopeless 0 3 3 3  0  PHQ - 2 Score 0 6 6 6  0  Altered sleeping 3 3 3 3    Tired, decreased energy 3 3 3 3    Change in appetite 3 3 3 3    Feeling bad or failure about yourself  1 3 2 3    Trouble concentrating 1 3 3 3    Moving slowly or fidgety/restless 0 0 1 0   Suicidal thoughts 0 0 1 0   PHQ-9 Score 11 21 22 21    Difficult doing work/chores Somewhat difficult  Extremely dIfficult Extremely dIfficult     The patient does not have a history of falls. I did complete a risk assessment for falls. A plan of care for falls was documented.   Past Medical History:  Past Medical History:  Diagnosis Date   Abnormal Pap smear of cervix    LEEP procedure   Hypertension     Surgical History:  History reviewed. No pertinent surgical history.  Medications:  Current Outpatient Medications on File Prior to Visit  Medication Sig   ferrous sulfate 325 (65 FE) MG tablet Take 1 tablet (325 mg total) by mouth daily with breakfast.   losartan (COZAAR) 25 MG tablet Take 1 tablet (25 mg total) by mouth daily.   metoprolol succinate (TOPROL-XL) 100 MG 24 hr tablet Take 1 tablet (100 mg total) by mouth daily. Take with  or immediately following a meal.   No current facility-administered medications on file prior to visit.    Allergies:  No Known Allergies  Social History:  Social History   Socioeconomic History   Marital status: Single    Spouse name: Not on file   Number of children: Not on file   Years of education: Not on file   Highest education level: Not on file  Occupational History   Not on file  Tobacco Use   Smoking status: Former    Types: Cigarettes   Smokeless tobacco: Never   Tobacco comments:    hemp CBD  Vaping Use   Vaping Use: Never used  Substance and Sexual Activity   Alcohol use: Yes    Alcohol/week: 3.0 standard drinks of alcohol    Types: 3 Shots of liquor per week    Comment: on the weekends   Drug use: Yes    Types: Marijuana    Sexual activity: Not on file  Other Topics Concern   Not on file  Social History Narrative   Not on file   Social Determinants of Health   Financial Resource Strain: Not on file  Food Insecurity: Not on file  Transportation Needs: Not on file  Physical Activity: Not on file  Stress: Not on file  Social Connections: Not on file  Intimate Partner Violence: Not on file   Social History   Tobacco Use  Smoking Status Former   Types: Cigarettes  Smokeless Tobacco Never  Tobacco Comments   hemp CBD   Social History   Substance and Sexual Activity  Alcohol Use Yes   Alcohol/week: 3.0 standard drinks of alcohol   Types: 3 Shots of liquor per week   Comment: on the weekends    Family History:  Family History  Problem Relation Age of Onset   Diabetes Mother    Hypertension Mother    Stroke Mother    COPD Mother    Hypertension Father    Diabetes Maternal Grandmother    Hypertension Maternal Grandmother    Stroke Maternal Grandmother    Diabetes Paternal Grandmother    Hypertension Paternal Grandmother    Stroke Paternal Grandmother     Past medical history, surgical history, medications, allergies, family history and social history reviewed with patient today and changes made to appropriate areas of the chart.   Review of Systems  Eyes:  Negative for blurred vision and double vision.  Respiratory:  Negative for shortness of breath.   Cardiovascular:  Negative for chest pain, palpitations and leg swelling.  Neurological:  Negative for dizziness and headaches.  Psychiatric/Behavioral:  Positive for depression. Negative for suicidal ideas. The patient is nervous/anxious.    All other ROS negative except what is listed above and in the HPI.      Objective:    BP (!) 160/87   Pulse 71   Temp 98.4 F (36.9 C) (Oral)   Ht 5' 6.7" (1.694 m)   Wt (!) 357 lb 3.2 oz (162 kg)   LMP 06/13/2022 (Exact Date)   SpO2 98%   BMI 56.45 kg/m   Wt Readings from Last 3  Encounters:  06/29/22 (!) 357 lb 3.2 oz (162 kg)  01/22/22 (!) 349 lb 8 oz (158.5 kg)  01/14/22 (!) 340 lb (154.2 kg)    Physical Exam Vitals and nursing note reviewed. Exam conducted with a chaperone present Wilhemena Durie, CMA).  Constitutional:      General: She is awake. She is not  in acute distress.    Appearance: Normal appearance. She is well-developed. She is obese. She is not ill-appearing.  HENT:     Head: Normocephalic and atraumatic.     Right Ear: Hearing, tympanic membrane, ear canal and external ear normal. No drainage.     Left Ear: Hearing, tympanic membrane, ear canal and external ear normal. No drainage.     Nose: Nose normal.     Right Sinus: No maxillary sinus tenderness or frontal sinus tenderness.     Left Sinus: No maxillary sinus tenderness or frontal sinus tenderness.     Mouth/Throat:     Mouth: Mucous membranes are moist.     Pharynx: Oropharynx is clear. Uvula midline. No pharyngeal swelling, oropharyngeal exudate or posterior oropharyngeal erythema.  Eyes:     General: Lids are normal.        Right eye: No discharge.        Left eye: No discharge.     Extraocular Movements: Extraocular movements intact.     Conjunctiva/sclera: Conjunctivae normal.     Pupils: Pupils are equal, round, and reactive to light.     Visual Fields: Right eye visual fields normal and left eye visual fields normal.  Neck:     Thyroid: No thyromegaly.     Vascular: No carotid bruit.     Trachea: Trachea normal.  Cardiovascular:     Rate and Rhythm: Normal rate and regular rhythm.     Heart sounds: Normal heart sounds. No murmur heard.    No gallop.  Pulmonary:     Effort: Pulmonary effort is normal. No accessory muscle usage or respiratory distress.     Breath sounds: Normal breath sounds.  Chest:  Breasts:    Right: Normal.     Left: Normal.  Abdominal:     General: Bowel sounds are normal.     Palpations: Abdomen is soft. There is no hepatomegaly or splenomegaly.      Tenderness: There is no abdominal tenderness.  Genitourinary:    Exam position: Knee-chest position.     Vagina: Normal.     Cervix: Normal.     Adnexa: Right adnexa normal and left adnexa normal.  Musculoskeletal:        General: Normal range of motion.     Cervical back: Normal range of motion and neck supple.     Right lower leg: No edema.     Left lower leg: No edema.  Lymphadenopathy:     Head:     Right side of head: No submental, submandibular, tonsillar, preauricular or posterior auricular adenopathy.     Left side of head: No submental, submandibular, tonsillar, preauricular or posterior auricular adenopathy.     Cervical: No cervical adenopathy.     Upper Body:     Right upper body: No supraclavicular, axillary or pectoral adenopathy.     Left upper body: No supraclavicular, axillary or pectoral adenopathy.  Skin:    General: Skin is warm and dry.     Capillary Refill: Capillary refill takes less than 2 seconds.     Findings: No rash.  Neurological:     Mental Status: She is alert and oriented to person, place, and time.     Gait: Gait is intact.  Psychiatric:        Attention and Perception: Attention normal.        Mood and Affect: Mood normal.        Speech: Speech normal.  Behavior: Behavior normal. Behavior is cooperative.        Thought Content: Thought content normal.        Judgment: Judgment normal.     Results for orders placed or performed in visit on 01/01/22  Vitamin D (25 hydroxy)  Result Value Ref Range   Vit D, 25-Hydroxy 12.1 (L) 30.0 - 100.0 ng/mL  Iron Binding Cap (TIBC)([facility])  Result Value Ref Range   Total Iron Binding Capacity 462 (H) 250 - 450 ug/dL   UIBC 270 (H) 350 - 093 ug/dL   Iron 14 (L) 27 - 818 ug/dL   Iron Saturation 3 (LL) 15 - 55 %      Assessment & Plan:   Problem List Items Addressed This Visit       Cardiovascular and Mediastinum   Benign hypertension    Chronic. Not well controlled.  Will add  Amlodipine 5mg  daily.  Side effects and benefits of medication discussed during visit.  Labs ordered today.  Continue with Metoprolol and Losartan.  Follow up in 1 month.  Call sooner if concerns arise.         Other   Morbid obesity (HCC)    Recommended eating smaller high protein, low fat meals more frequently and exercising 30 mins a day 5 times a week with a goal of 10-15lb weight loss in the next 3 months.       Anemia    Chronic. Last Hemoglobin in July was 7.9%. Patient has very heavy periods.  Labs ordered today. Will make recommendations based on labs.       Relevant Orders   CBC with Differential/Platelet   Iron, TIBC and Ferritin Panel   Anxiety and depression    Chronic. Not well controlled.  Will start Celexa 10mg  daily.  Side effects and benefits of medication discussed during visit today.  Labs ordered.  Follow up in 1 month.  Call sooner if concerns arise.       Relevant Medications   citalopram (CELEXA) 10 MG tablet   Pre-diabetes    Labs ordered at visit today.  Will make recommendations based on lab results.        Relevant Orders   HgB A1c   Other Visit Diagnoses     Annual physical exam    -  Primary   Health maintenance reviewed during visit today. Labs ordered.  Vaccines up to date.  PAP done.  Mammogram ordered.   Relevant Orders   CBC with Differential/Platelet   Comprehensive metabolic panel   TSH   Urinalysis, Routine w reflex microscopic   Cytology - PAP   HgB A1c   Encounter for hepatitis C screening test for low risk patient       Relevant Orders   Hepatitis C Antibody   Screening for ischemic heart disease       Relevant Orders   Lipid panel   Encounter for screening mammogram for malignant neoplasm of breast       Relevant Orders   MM 3D SCREEN BREAST BILATERAL        Follow up plan: Return in about 1 month (around 07/30/2022) for Depression/Anxiety FU.   LABORATORY TESTING:  - Pap smear: pap done  IMMUNIZATIONS:   - Tdap:  Tetanus vaccination status reviewed: last tetanus booster within 10 years. - Influenza: Up to date - Pneumovax: Not applicable - Prevnar: Not applicable - COVID: Not applicable - HPV: Not applicable - Shingrix vaccine: Not applicable  SCREENING: -Mammogram: Ordered today  -  Colonoscopy: Not applicable  - Bone Density: Not applicable  -Hearing Test: Not applicable  -Spirometry: Not applicable   PATIENT COUNSELING:   Advised to take 1 mg of folate supplement per day if capable of pregnancy.   Sexuality: Discussed sexually transmitted diseases, partner selection, use of condoms, avoidance of unintended pregnancy  and contraceptive alternatives.   Advised to avoid cigarette smoking.  I discussed with the patient that most people either abstain from alcohol or drink within safe limits (<=14/week and <=4 drinks/occasion for males, <=7/weeks and <= 3 drinks/occasion for females) and that the risk for alcohol disorders and other health effects rises proportionally with the number of drinks per week and how often a drinker exceeds daily limits.  Discussed cessation/primary prevention of drug use and availability of treatment for abuse.   Diet: Encouraged to adjust caloric intake to maintain  or achieve ideal body weight, to reduce intake of dietary saturated fat and total fat, to limit sodium intake by avoiding high sodium foods and not adding table salt, and to maintain adequate dietary potassium and calcium preferably from fresh fruits, vegetables, and low-fat dairy products.    stressed the importance of regular exercise  Injury prevention: Discussed safety belts, safety helmets, smoke detector, smoking near bedding or upholstery.   Dental health: Discussed importance of regular tooth brushing, flossing, and dental visits.    NEXT PREVENTATIVE PHYSICAL DUE IN 1 YEAR. Return in about 1 month (around 07/30/2022) for Depression/Anxiety FU.

## 2022-06-29 NOTE — Assessment & Plan Note (Signed)
Chronic. Not well controlled.  Will add Amlodipine 5mg  daily.  Side effects and benefits of medication discussed during visit.  Labs ordered today.  Continue with Metoprolol and Losartan.  Follow up in 1 month.  Call sooner if concerns arise.

## 2022-06-29 NOTE — Patient Instructions (Signed)
Please call to schedule your mammogram and/or bone density: [facility]  Address: [address], [place], [place] [address] Phone: [phone]  [facility] [place] [address] [place],    [address] Phone: [phone]   

## 2022-06-29 NOTE — Assessment & Plan Note (Signed)
Recommended eating smaller high protein, low fat meals more frequently and exercising 30 mins a day 5 times a week with a goal of 10-15lb weight loss in the next 3 months.  

## 2022-06-29 NOTE — Assessment & Plan Note (Signed)
Labs ordered at visit today.  Will make recommendations based on lab results.   

## 2022-06-30 ENCOUNTER — Encounter: Payer: Self-pay | Admitting: Nurse Practitioner

## 2022-06-30 LAB — CBC WITH DIFFERENTIAL/PLATELET
Basophils Absolute: 0 10*3/uL (ref 0.0–0.2)
Basos: 1 %
EOS (ABSOLUTE): 0.4 10*3/uL (ref 0.0–0.4)
Eos: 6 %
Hematocrit: 34.6 % (ref 34.0–46.6)
Hemoglobin: 11.4 g/dL (ref 11.1–15.9)
Immature Grans (Abs): 0 10*3/uL (ref 0.0–0.1)
Immature Granulocytes: 0 %
Lymphocytes Absolute: 2.1 10*3/uL (ref 0.7–3.1)
Lymphs: 35 %
MCH: 29.3 pg (ref 26.6–33.0)
MCHC: 32.9 g/dL (ref 31.5–35.7)
MCV: 89 fL (ref 79–97)
Monocytes Absolute: 0.5 10*3/uL (ref 0.1–0.9)
Monocytes: 8 %
Neutrophils Absolute: 3 10*3/uL (ref 1.4–7.0)
Neutrophils: 50 %
Platelets: 371 10*3/uL (ref 150–450)
RBC: 3.89 x10E6/uL (ref 3.77–5.28)
RDW: 16.1 % — ABNORMAL HIGH (ref 11.7–15.4)
WBC: 6 10*3/uL (ref 3.4–10.8)

## 2022-06-30 LAB — COMPREHENSIVE METABOLIC PANEL
ALT: 13 IU/L (ref 0–32)
AST: 16 IU/L (ref 0–40)
Albumin/Globulin Ratio: 1.3 (ref 1.2–2.2)
Albumin: 4.3 g/dL (ref 3.9–4.9)
Alkaline Phosphatase: 59 IU/L (ref 44–121)
BUN/Creatinine Ratio: 21 (ref 9–23)
BUN: 16 mg/dL (ref 6–24)
Bilirubin Total: <0.2 mg/dL (ref 0.0–1.2)
CO2: 23 mmol/L (ref 20–29)
Calcium: 9.5 mg/dL (ref 8.7–10.2)
Chloride: 103 mmol/L (ref 96–106)
Creatinine, Ser: 0.77 mg/dL (ref 0.57–1.00)
Globulin, Total: 3.3 g/dL (ref 1.5–4.5)
Glucose: 85 mg/dL (ref 70–99)
Potassium: 4.4 mmol/L (ref 3.5–5.2)
Sodium: 140 mmol/L (ref 134–144)
Total Protein: 7.6 g/dL (ref 6.0–8.5)
eGFR: 100 mL/min/{1.73_m2} (ref >59)

## 2022-06-30 LAB — LIPID PANEL
Chol/HDL Ratio: 4.3 ratio (ref 0.0–4.4)
Cholesterol, Total: 171 mg/dL (ref 100–199)
HDL: 40 mg/dL (ref >39)
LDL Chol Calc (NIH): 88 mg/dL (ref 0–99)
Triglycerides: 259 mg/dL — ABNORMAL HIGH (ref 0–149)
VLDL Cholesterol Cal: 43 mg/dL — ABNORMAL HIGH (ref 5–40)

## 2022-06-30 LAB — IRON,TIBC AND FERRITIN PANEL
Ferritin: 34 ng/mL (ref 15–150)
Iron Saturation: 26 % (ref 15–55)
Iron: 94 ug/dL (ref 27–159)
Total Iron Binding Capacity: 368 ug/dL (ref 250–450)
UIBC: 274 ug/dL (ref 131–425)

## 2022-06-30 LAB — HEMOGLOBIN A1C
Est. average glucose Bld gHb Est-mCnc: 114 mg/dL
Hgb A1c MFr Bld: 5.6 % (ref 4.8–5.6)

## 2022-06-30 LAB — TSH: TSH: 2.76 u[IU]/mL (ref 0.450–4.500)

## 2022-06-30 LAB — HEPATITIS C ANTIBODY: Hep C Virus Ab: NONREACTIVE

## 2022-06-30 NOTE — Progress Notes (Signed)
Hi Nancy Garza. It was nice to meet you yesterday.  Your lab work looks good.  Your anemia has improved back to the normal range.  I recommend stopping the iron supplement at this time and we will recheck it at your next visit to make sure you are able to maintain your levels without it.  Your A1c is in the normal range which is great news.  No concerns at this time. Continue with your current medication regimen.  Follow up as discussed.  Please let me know if you have any questions.

## 2022-07-01 ENCOUNTER — Other Ambulatory Visit: Payer: Self-pay

## 2022-07-02 LAB — CYTOLOGY - PAP
Adequacy: ABSENT
Diagnosis: NEGATIVE

## 2022-07-03 ENCOUNTER — Other Ambulatory Visit: Payer: Self-pay

## 2022-07-03 MED ORDER — VALSARTAN 80 MG PO TABS
80.0000 mg | ORAL_TABLET | Freq: Every day | ORAL | 0 refills | Status: DC
  Filled 2022-07-03 – 2022-07-22 (×2): qty 60, 60d supply, fill #0

## 2022-07-03 NOTE — Progress Notes (Signed)
Hi Valor. Your PAP was normal.  We will repeat in 5 years.

## 2022-07-20 ENCOUNTER — Other Ambulatory Visit: Payer: Self-pay

## 2022-07-20 ENCOUNTER — Encounter: Payer: Self-pay | Admitting: Nurse Practitioner

## 2022-07-20 ENCOUNTER — Other Ambulatory Visit: Payer: Self-pay | Admitting: Gerontology

## 2022-07-20 DIAGNOSIS — I10 Essential (primary) hypertension: Secondary | ICD-10-CM

## 2022-07-21 ENCOUNTER — Other Ambulatory Visit: Payer: Self-pay

## 2022-07-22 ENCOUNTER — Other Ambulatory Visit: Payer: Self-pay

## 2022-07-22 ENCOUNTER — Other Ambulatory Visit: Payer: Self-pay | Admitting: Gerontology

## 2022-07-22 DIAGNOSIS — I10 Essential (primary) hypertension: Secondary | ICD-10-CM

## 2022-07-23 ENCOUNTER — Other Ambulatory Visit: Payer: Self-pay

## 2022-07-28 ENCOUNTER — Other Ambulatory Visit: Payer: Self-pay

## 2022-07-28 ENCOUNTER — Other Ambulatory Visit: Payer: Self-pay | Admitting: Nurse Practitioner

## 2022-07-28 ENCOUNTER — Other Ambulatory Visit: Payer: Self-pay | Admitting: Gerontology

## 2022-07-28 DIAGNOSIS — I10 Essential (primary) hypertension: Secondary | ICD-10-CM

## 2022-07-28 MED ORDER — METOPROLOL SUCCINATE ER 100 MG PO TB24
100.0000 mg | ORAL_TABLET | Freq: Every day | ORAL | 1 refills | Status: DC
  Filled 2022-07-28: qty 90, 90d supply, fill #0
  Filled 2022-07-29: qty 30, 30d supply, fill #0
  Filled 2022-09-07: qty 30, 30d supply, fill #1
  Filled 2022-10-12: qty 30, 30d supply, fill #2
  Filled 2022-11-20: qty 30, 30d supply, fill #3
  Filled 2023-01-01: qty 30, 30d supply, fill #4
  Filled 2023-02-12: qty 30, 30d supply, fill #5

## 2022-07-28 NOTE — Telephone Encounter (Signed)
Patient no longer a patient at [facility]. Patient is established with a new PCP, Veva Holes, NP

## 2022-07-29 ENCOUNTER — Other Ambulatory Visit: Payer: Self-pay

## 2022-07-29 MED FILL — Citalopram Hydrobromide Tab 10 MG (Base Equiv): ORAL | 30 days supply | Qty: 30 | Fill #0 | Status: AC

## 2022-07-29 NOTE — Telephone Encounter (Signed)
Requested Prescriptions  Pending Prescriptions Disp Refills   citalopram (CELEXA) 10 MG tablet 30 tablet 0    Sig: Take 1 tablet (10 mg total) by mouth daily.     Psychiatry:  Antidepressants - SSRI Passed - 07/28/2022  3:33 PM      Passed - Completed PHQ-2 or PHQ-9 in the last 360 days      Passed - Valid encounter within last 6 months    Recent Outpatient Visits           1 month ago Annual physical exam   [place] Jon Billings, NP   2 years ago Anxiety and depression   [place], Jessica A, NP   2 years ago Anxiety and depression   [facility], [place]   2 years ago [place] Kathrine Haddock, NP   2 years ago Anxiety and depression   [place] [facility], [place]       Future Appointments             In 2 weeks Jon Billings, NP [place]

## 2022-07-30 ENCOUNTER — Ambulatory Visit: Payer: Commercial Managed Care - PPO | Admitting: Nurse Practitioner

## 2022-08-13 ENCOUNTER — Other Ambulatory Visit: Payer: Self-pay

## 2022-08-13 ENCOUNTER — Encounter: Payer: Self-pay | Admitting: Nurse Practitioner

## 2022-08-13 ENCOUNTER — Ambulatory Visit: Payer: Commercial Managed Care - PPO | Admitting: Nurse Practitioner

## 2022-08-13 VITALS — BP 149/96 | HR 70 | Temp 98.0°F | Wt 361.0 lb

## 2022-08-13 DIAGNOSIS — F32A Depression, unspecified: Secondary | ICD-10-CM | POA: Diagnosis not present

## 2022-08-13 DIAGNOSIS — F419 Anxiety disorder, unspecified: Secondary | ICD-10-CM

## 2022-08-13 DIAGNOSIS — N920 Excessive and frequent menstruation with regular cycle: Secondary | ICD-10-CM | POA: Diagnosis not present

## 2022-08-13 DIAGNOSIS — I10 Essential (primary) hypertension: Secondary | ICD-10-CM | POA: Diagnosis not present

## 2022-08-13 MED ORDER — CITALOPRAM HYDROBROMIDE 20 MG PO TABS
20.0000 mg | ORAL_TABLET | Freq: Every day | ORAL | 0 refills | Status: DC
  Filled 2022-08-13: qty 30, 30d supply, fill #0
  Filled 2022-09-25 – 2022-10-12 (×2): qty 30, 30d supply, fill #1
  Filled 2022-11-20: qty 30, 30d supply, fill #2

## 2022-08-13 MED ORDER — VALSARTAN 160 MG PO TABS
160.0000 mg | ORAL_TABLET | Freq: Every day | ORAL | 0 refills | Status: DC
  Filled 2022-08-13: qty 30, 30d supply, fill #0
  Filled 2022-09-25 – 2022-10-12 (×2): qty 30, 30d supply, fill #1
  Filled 2022-11-20: qty 30, 30d supply, fill #2

## 2022-08-13 NOTE — Assessment & Plan Note (Signed)
Chronic. Not well controlled.  Will increase Valsartan to '160mg'$  daily.  Side effects and benefits of medication discussed during visit.  Labs ordered today.  Continue with Metoprolol.  Follow up in 6 weeks.  Call sooner if concerns arise.

## 2022-08-13 NOTE — Assessment & Plan Note (Signed)
Recommended eating smaller high protein, low fat meals more frequently and exercising 30 mins a day 5 times a week with a goal of 10-15lb weight loss in the next 3 months.  

## 2022-08-13 NOTE — Assessment & Plan Note (Signed)
Chronic. Improved but not well controlled.  Will increase Celexa to '20mg'$ .  Side effects and benefits of medication discussed during visit.  Follow up in 6 weeks.

## 2022-08-13 NOTE — Progress Notes (Signed)
BP (!) 149/96 (BP Location: Left Arm, Cuff Size: Large)   Pulse 70   Temp 98 F (36.7 C) (Oral)   Wt (!) 361 lb (163.7 kg)   LMP 08/07/2022 (Exact Date)   SpO2 98%   BMI 57.05 kg/m    Subjective:    Patient ID: Nancy Garza, female    DOB: 04/28/1981, 42 y.o.   MRN: [id number]  HPI: Nancy Garza is a 42 y.o. female  Chief Complaint  Patient presents with   Anxiety   Depression   Menstrual Problem    Pt states she has been having very heavy periods, requesting referral to go to OBGYN. States she is not taking the iron tablet    Obesity    Pt states she would like to discuss starting Mounjaro    HYPERTENSION without Chronic Kidney Disease Hypertension status: controlled  Satisfied with current treatment? yes Duration of hypertension: years BP monitoring frequency:  not checking BP range:  BP medication side effects:  no Medication compliance: excellent compliance Previous BP meds:metoprolol  and losartan (cozaar) Aspirin: no Recurrent headaches: no Visual changes: no Palpitations: no Dyspnea: no Chest pain: no Lower extremity edema: no Dizzy/lightheaded: no  ANEMIA Anemia status: controlled Etiology of anemia: iDA Duration of anemia treatment:  Compliance with treatment: excellent compliance Iron supplementation side effects: yes Severity of anemia: severe Fatigue: no Decreased exercise tolerance: yes  Dyspnea on exertion: yes Palpitations: no Bleeding: yes- heavy periods Pica: no  Patient states she is concerned about her periods.  They are extremely heaving, she has to wear a pull up for 3 days.  She bleeds for a whole 7 days.  After the 7 days she has a mucousy discharge for about 4 days after her cycle.    MOOD Patient states she feels like the Celexa takes the edge off a little bit.  States she still is worried and irritable a lot.  She still has a lot of extra stress on her.    [facility] Office Visit from 08/13/2022 in [facility]  PHQ-9 Total Score 12         08/13/2022    3:23 PM 06/29/2022    3:22 PM 12/07/2019    9:15 AM  GAD 7 : Generalized Anxiety Score  Nervous, Anxious, on Edge 3 0 3  Control/stop worrying '3 1 3  '$ Worry too much - different things '3 3 3  '$ Trouble relaxing '3 3 3  '$ Restless 0 3 0  Easily annoyed or irritable '3 3 3  '$ Afraid - awful might happen 0 3 0  Total GAD 7 Score '15 16 15  '$ Anxiety Difficulty Somewhat difficult Somewhat difficult Extremely difficult      Relevant past medical, surgical, family and social history reviewed and updated as indicated. Interim medical history since our last visit reviewed. Allergies and medications reviewed and updated.  Review of Systems  Eyes:  Negative for visual disturbance.  Respiratory:  Negative for cough, chest tightness and shortness of breath.   Cardiovascular:  Negative for chest pain, palpitations and leg swelling.  Neurological:  Negative for dizziness and headaches.  Psychiatric/Behavioral:  Positive for dysphoric mood. Negative for suicidal ideas. The patient is nervous/anxious.     Per HPI unless specifically indicated above     Objective:    BP (!) 149/96 (BP Location: Left Arm, Cuff Size: Large)   Pulse 70   Temp 98 F (36.7 C) (Oral)   Wt (!) 361  lb (163.7 kg)   LMP 08/07/2022 (Exact Date)   SpO2 98%   BMI 57.05 kg/m   Wt Readings from Last 3 Encounters:  08/13/22 (!) 361 lb (163.7 kg)  06/29/22 (!) 357 lb 3.2 oz (162 kg)  01/22/22 (!) 349 lb 8 oz (158.5 kg)    Physical Exam Vitals and nursing note reviewed.  Constitutional:      General: She is not in acute distress.    Appearance: Normal appearance. She is not ill-appearing, toxic-appearing or diaphoretic.  HENT:     Head: Normocephalic.     Right Ear: External ear normal.     Left Ear: External ear normal.     Nose: Nose normal.     Mouth/Throat:     Mouth: Mucous membranes are moist.     Pharynx: Oropharynx is clear.  Eyes:      General:        Right eye: No discharge.        Left eye: No discharge.     Extraocular Movements: Extraocular movements intact.     Conjunctiva/sclera: Conjunctivae normal.     Pupils: Pupils are equal, round, and reactive to light.  Cardiovascular:     Rate and Rhythm: Normal rate and regular rhythm.     Heart sounds: No murmur heard. Pulmonary:     Effort: Pulmonary effort is normal. No respiratory distress.     Breath sounds: Normal breath sounds. No wheezing or rales.  Musculoskeletal:     Cervical back: Normal range of motion and neck supple.  Skin:    General: Skin is warm and dry.     Capillary Refill: Capillary refill takes less than 2 seconds.  Neurological:     General: No focal deficit present.     Mental Status: She is alert and oriented to person, place, and time. Mental status is at baseline.  Psychiatric:        Mood and Affect: Mood normal.        Behavior: Behavior normal.        Thought Content: Thought content normal.        Judgment: Judgment normal.     Results for orders placed or performed in visit on 06/29/22  CBC with Differential/Platelet  Result Value Ref Range   WBC 6.0 3.4 - 10.8 x10E3/uL   RBC 3.89 3.77 - 5.28 x10E6/uL   Hemoglobin 11.4 11.1 - 15.9 g/dL   Hematocrit 34.6 34.0 - 46.6 %   MCV 89 79 - 97 fL   MCH 29.3 26.6 - 33.0 pg   MCHC 32.9 31.5 - 35.7 g/dL   RDW 16.1 (H) 11.7 - 15.4 %   Platelets 371 150 - 450 x10E3/uL   Neutrophils 50 Not Estab. %   Lymphs 35 Not Estab. %   Monocytes 8 Not Estab. %   Eos 6 Not Estab. %   Basos 1 Not Estab. %   Neutrophils Absolute 3.0 1.4 - 7.0 x10E3/uL   Lymphocytes Absolute 2.1 0.7 - 3.1 x10E3/uL   Monocytes Absolute 0.5 0.1 - 0.9 x10E3/uL   EOS (ABSOLUTE) 0.4 0.0 - 0.4 x10E3/uL   Basophils Absolute 0.0 0.0 - 0.2 x10E3/uL   Immature Granulocytes 0 Not Estab. %   Immature Grans (Abs) 0.0 0.0 - 0.1 x10E3/uL  Comprehensive metabolic panel  Result Value Ref Range   Glucose 85 70 - 99 mg/dL   BUN  16 6 - 24 mg/dL   Creatinine, Ser 0.77 0.57 - 1.00 mg/dL   eGFR  100 >59 mL/min/1.73   BUN/Creatinine Ratio 21 9 - 23   Sodium 140 134 - 144 mmol/L   Potassium 4.4 3.5 - 5.2 mmol/L   Chloride 103 96 - 106 mmol/L   CO2 23 20 - 29 mmol/L   Calcium 9.5 8.7 - 10.2 mg/dL   Total Protein 7.6 6.0 - 8.5 g/dL   Albumin 4.3 3.9 - 4.9 g/dL   Globulin, Total 3.3 1.5 - 4.5 g/dL   Albumin/Globulin Ratio 1.3 1.2 - 2.2   Bilirubin Total <0.2 0.0 - 1.2 mg/dL   Alkaline Phosphatase 59 44 - 121 IU/L   AST 16 0 - 40 IU/L   ALT 13 0 - 32 IU/L  Lipid panel  Result Value Ref Range   Cholesterol, Total 171 100 - 199 mg/dL   Triglycerides 259 (H) 0 - 149 mg/dL   HDL 40 >39 mg/dL   VLDL Cholesterol Cal 43 (H) 5 - 40 mg/dL   LDL Chol Calc (NIH) 88 0 - 99 mg/dL   Chol/HDL Ratio 4.3 0.0 - 4.4 ratio  TSH  Result Value Ref Range   TSH 2.760 0.450 - 4.500 uIU/mL  Urinalysis, Routine w reflex microscopic  Result Value Ref Range   Specific Gravity, UA 1.025 1.005 - 1.030   pH, UA 6.5 5.0 - 7.5   Color, UA Yellow Yellow   Appearance Ur Clear Clear   Leukocytes,UA Negative Negative   Protein,UA Negative Negative/Trace   Glucose, UA Negative Negative   Ketones, UA Negative Negative   RBC, UA Negative Negative   Bilirubin, UA Negative Negative   Urobilinogen, Ur 0.2 0.2 - 1.0 mg/dL   Nitrite, UA Negative Negative   Microscopic Examination Comment   HgB A1c  Result Value Ref Range   Hgb A1c MFr Bld 5.6 4.8 - 5.6 %   Est. average glucose Bld gHb Est-mCnc 114 mg/dL  Hepatitis C Antibody  Result Value Ref Range   Hep C Virus Ab Non Reactive Non Reactive  Iron, TIBC and Ferritin Panel  Result Value Ref Range   Total Iron Binding Capacity 368 250 - 450 ug/dL   UIBC 274 131 - 425 ug/dL   Iron 94 27 - 159 ug/dL   Iron Saturation 26 15 - 55 %   Ferritin 34 15 - 150 ng/mL  Cytology - PAP  Result Value Ref Range   Adequacy      Satisfactory for evaluation; transformation zone component ABSENT.   Diagnosis       - Negative for intraepithelial lesion or malignancy (NILM)      Assessment & Plan:   Problem List Items Addressed This Visit       Cardiovascular and Mediastinum   Benign hypertension - Primary    Chronic. Not well controlled.  Will increase Valsartan to '160mg'$  daily.  Side effects and benefits of medication discussed during visit.  Labs ordered today.  Continue with Metoprolol.  Follow up in 6 weeks.  Call sooner if concerns arise.       Relevant Medications   valsartan (DIOVAN) 160 MG tablet     Other   Morbid obesity (HCC)    Recommended eating smaller high protein, low fat meals more frequently and exercising 30 mins a day 5 times a week with a goal of 10-15lb weight loss in the next 3 months.       Anxiety and depression    Chronic. Improved but not well controlled.  Will increase Celexa to '20mg'$ .  Side effects and benefits  of medication discussed during visit.  Follow up in 6 weeks.        Relevant Medications   citalopram (CELEXA) 20 MG tablet   Other Visit Diagnoses     Menorrhagia with regular cycle       Relevant Orders   Ambulatory referral to Gynecology        Follow up plan: Return in about 6 weeks (around 09/24/2022) for BP Check, Depression/Anxiety FU.

## 2022-08-14 ENCOUNTER — Other Ambulatory Visit: Payer: Self-pay

## 2022-08-31 ENCOUNTER — Encounter: Payer: Self-pay | Admitting: Nurse Practitioner

## 2022-09-01 ENCOUNTER — Telehealth: Payer: Self-pay | Admitting: Nurse Practitioner

## 2022-09-01 NOTE — Telephone Encounter (Signed)
LVM regarding making an appointment per provider's request.

## 2022-09-01 NOTE — Telephone Encounter (Signed)
Copied from [place] [id number]. Topic: Appointment Scheduling - Scheduling Inquiry for Clinic >> Sep 01, 2022 11:29 AM [name] wrote: Reason for CRM: Pt is returning a call from [facility] regarding making an appointment. She stated she was recently seen and does not understand why she needs to make another appointment would like to just do labs.  Pt asked if she has no choice and would like to schedule a virtual appointment as she cannot continue to take off work. Please advise.

## 2022-09-01 NOTE — Telephone Encounter (Signed)
Per Nancy Garza, patient needs an appointment to discuss her fatigue and have labs ordered. Patient can have a virtual visit but she would still have to come by to do labs. Or she can schedule in office and have the visit and labs done at the same time.

## 2022-09-02 NOTE — Telephone Encounter (Signed)
LVM to call office to get scheduled and explained regarding the options and put in a CRM.

## 2022-09-07 ENCOUNTER — Encounter: Payer: Self-pay | Admitting: Nurse Practitioner

## 2022-09-07 ENCOUNTER — Ambulatory Visit: Payer: Commercial Managed Care - PPO | Admitting: Nurse Practitioner

## 2022-09-07 VITALS — BP 135/84 | HR 73 | Temp 98.5°F | Wt 359.4 lb

## 2022-09-07 DIAGNOSIS — R5383 Other fatigue: Secondary | ICD-10-CM | POA: Diagnosis not present

## 2022-09-07 DIAGNOSIS — E559 Vitamin D deficiency, unspecified: Secondary | ICD-10-CM

## 2022-09-07 NOTE — Progress Notes (Signed)
BP 135/84 (BP Location: Right Arm, Cuff Size: Large)   Pulse 73   Temp 98.5 F (36.9 C) (Oral)   Wt (!) 359 lb 6.4 oz (163 kg)   LMP 08/07/2022 (Exact Date)   SpO2 98%   BMI 56.80 kg/m    Subjective:    Patient ID: Nancy Garza, female    DOB: 1981-05-25, 42 y.o.   MRN: [id number]  HPI: Nancy CREMEANS is a 42 y.o. female  Chief Complaint  Patient presents with   Fatigue   FATIGUE Patient states she has been having heavy periods.  Symptoms are worse when her period is coming.  Feels out of breath when gets out of the car and walks across the parking lot.  Duration:  weeks Severity: 8/10  Onset: sudden Context when symptoms started:   has had IDA in the past Symptoms improve with rest: no  Depressive symptoms: no Stress/anxiety: no Insomnia: no  Snoring: no Observed apnea by bed partner: no Daytime hypersomnolence:no Wakes feeling refreshed: no History of sleep study: no Dysnea on exertion:  no Orthopnea/PND: no Chest pain: no Chronic cough: no Lower extremity edema: no Arthralgias:no Myalgias: no Weakness: no Rash: no  Relevant past medical, surgical, family and social history reviewed and updated as indicated. Interim medical history since our last visit reviewed. Allergies and medications reviewed and updated.  Review of Systems  Constitutional:  Positive for fatigue.  Respiratory:  Positive for shortness of breath.     Per HPI unless specifically indicated above     Objective:    BP 135/84 (BP Location: Right Arm, Cuff Size: Large)   Pulse 73   Temp 98.5 F (36.9 C) (Oral)   Wt (!) 359 lb 6.4 oz (163 kg)   LMP 08/07/2022 (Exact Date)   SpO2 98%   BMI 56.80 kg/m   Wt Readings from Last 3 Encounters:  09/07/22 (!) 359 lb 6.4 oz (163 kg)  08/13/22 (!) 361 lb (163.7 kg)  06/29/22 (!) 357 lb 3.2 oz (162 kg)    Physical Exam Vitals and nursing note reviewed.  Constitutional:      General: She is not in acute distress.    Appearance:  Normal appearance. She is obese. She is not ill-appearing, toxic-appearing or diaphoretic.  HENT:     Head: Normocephalic.     Right Ear: External ear normal.     Left Ear: External ear normal.     Nose: Nose normal.     Mouth/Throat:     Mouth: Mucous membranes are moist.     Pharynx: Oropharynx is clear.  Eyes:     General:        Right eye: No discharge.        Left eye: No discharge.     Extraocular Movements: Extraocular movements intact.     Conjunctiva/sclera: Conjunctivae normal.     Pupils: Pupils are equal, round, and reactive to light.  Cardiovascular:     Rate and Rhythm: Normal rate and regular rhythm.     Heart sounds: No murmur heard. Pulmonary:     Effort: Pulmonary effort is normal. No respiratory distress.     Breath sounds: Normal breath sounds. No wheezing or rales.  Musculoskeletal:     Cervical back: Normal range of motion and neck supple.  Skin:    General: Skin is warm and dry.     Capillary Refill: Capillary refill takes less than 2 seconds.  Neurological:     General: No focal  deficit present.     Mental Status: She is alert and oriented to person, place, and time. Mental status is at baseline.  Psychiatric:        Mood and Affect: Mood normal.        Behavior: Behavior normal.        Thought Content: Thought content normal.        Judgment: Judgment normal.     Results for orders placed or performed in visit on 06/29/22  CBC with Differential/Platelet  Result Value Ref Range   WBC 6.0 3.4 - 10.8 x10E3/uL   RBC 3.89 3.77 - 5.28 x10E6/uL   Hemoglobin 11.4 11.1 - 15.9 g/dL   Hematocrit 63.8 93.7 - 46.6 %   MCV 89 79 - 97 fL   MCH 29.3 26.6 - 33.0 pg   MCHC 32.9 31.5 - 35.7 g/dL   RDW 34.2 (H) 87.6 - 81.1 %   Platelets 371 150 - 450 x10E3/uL   Neutrophils 50 Not Estab. %   Lymphs 35 Not Estab. %   Monocytes 8 Not Estab. %   Eos 6 Not Estab. %   Basos 1 Not Estab. %   Neutrophils Absolute 3.0 1.4 - 7.0 x10E3/uL   Lymphocytes Absolute 2.1  0.7 - 3.1 x10E3/uL   Monocytes Absolute 0.5 0.1 - 0.9 x10E3/uL   EOS (ABSOLUTE) 0.4 0.0 - 0.4 x10E3/uL   Basophils Absolute 0.0 0.0 - 0.2 x10E3/uL   Immature Granulocytes 0 Not Estab. %   Immature Grans (Abs) 0.0 0.0 - 0.1 x10E3/uL  Comprehensive metabolic panel  Result Value Ref Range   Glucose 85 70 - 99 mg/dL   BUN 16 6 - 24 mg/dL   Creatinine, Ser 5.72 0.57 - 1.00 mg/dL   eGFR 620 >35 DH/RCB/6.38   BUN/Creatinine Ratio 21 9 - 23   Sodium 140 134 - 144 mmol/L   Potassium 4.4 3.5 - 5.2 mmol/L   Chloride 103 96 - 106 mmol/L   CO2 23 20 - 29 mmol/L   Calcium 9.5 8.7 - 10.2 mg/dL   Total Protein 7.6 6.0 - 8.5 g/dL   Albumin 4.3 3.9 - 4.9 g/dL   Globulin, Total 3.3 1.5 - 4.5 g/dL   Albumin/Globulin Ratio 1.3 1.2 - 2.2   Bilirubin Total <0.2 0.0 - 1.2 mg/dL   Alkaline Phosphatase 59 44 - 121 IU/L   AST 16 0 - 40 IU/L   ALT 13 0 - 32 IU/L  Lipid panel  Result Value Ref Range   Cholesterol, Total 171 100 - 199 mg/dL   Triglycerides 453 (H) 0 - 149 mg/dL   HDL 40 >64 mg/dL   VLDL Cholesterol Cal 43 (H) 5 - 40 mg/dL   LDL Chol Calc (NIH) 88 0 - 99 mg/dL   Chol/HDL Ratio 4.3 0.0 - 4.4 ratio  TSH  Result Value Ref Range   TSH 2.760 0.450 - 4.500 uIU/mL  Urinalysis, Routine w reflex microscopic  Result Value Ref Range   Specific Gravity, UA 1.025 1.005 - 1.030   pH, UA 6.5 5.0 - 7.5   Color, UA Yellow Yellow   Appearance Ur Clear Clear   Leukocytes,UA Negative Negative   Protein,UA Negative Negative/Trace   Glucose, UA Negative Negative   Ketones, UA Negative Negative   RBC, UA Negative Negative   Bilirubin, UA Negative Negative   Urobilinogen, Ur 0.2 0.2 - 1.0 mg/dL   Nitrite, UA Negative Negative   Microscopic Examination Comment   HgB A1c  Result Value Ref  Range   Hgb A1c MFr Bld 5.6 4.8 - 5.6 %   Est. average glucose Bld gHb Est-mCnc 114 mg/dL  Hepatitis C Antibody  Result Value Ref Range   Hep C Virus Ab Non Reactive Non Reactive  Iron, TIBC and Ferritin Panel   Result Value Ref Range   Total Iron Binding Capacity 368 250 - 450 ug/dL   UIBC 161274 096131 - 045425 ug/dL   Iron 94 27 - 409159 ug/dL   Iron Saturation 26 15 - 55 %   Ferritin 34 15 - 150 ng/mL  Cytology - PAP  Result Value Ref Range   Adequacy      Satisfactory for evaluation; transformation zone component ABSENT.   Diagnosis      - Negative for intraepithelial lesion or malignancy (NILM)      Assessment & Plan:   Problem List Items Addressed This Visit   None Visit Diagnoses     Other fatigue    -  Primary   Labs ordered at visit today. Has had IDA anemia in the past.   Relevant Orders   Anemia Profile B   Thyroid Panel With TSH   Thyroid peroxidase antibody   Vitamin D deficiency       Labs ordered at visit today. Will make recommendations based on lab results.   Relevant Orders   Vitamin D (25 hydroxy)        Follow up plan: Return if symptoms worsen or fail to improve.

## 2022-09-08 ENCOUNTER — Encounter: Payer: Self-pay | Admitting: Nurse Practitioner

## 2022-09-08 ENCOUNTER — Other Ambulatory Visit: Payer: Self-pay

## 2022-09-08 LAB — THYROID PANEL WITH TSH
Free Thyroxine Index: 1.7 (ref 1.2–4.9)
T3 Uptake Ratio: 28 % (ref 24–39)
T4, Total: 5.9 ug/dL (ref 4.5–12.0)
TSH: 1.73 u[IU]/mL (ref 0.450–4.500)

## 2022-09-08 LAB — ANEMIA PROFILE B
Basophils Absolute: 0 10*3/uL (ref 0.0–0.2)
Basos: 1 %
EOS (ABSOLUTE): 0.6 10*3/uL — ABNORMAL HIGH (ref 0.0–0.4)
Eos: 9 %
Ferritin: 10 ng/mL — ABNORMAL LOW (ref 15–150)
Folate: 6.7 ng/mL (ref >3.0)
Hematocrit: 30.4 % — ABNORMAL LOW (ref 34.0–46.6)
Hemoglobin: 9.5 g/dL — ABNORMAL LOW (ref 11.1–15.9)
Immature Grans (Abs): 0 10*3/uL (ref 0.0–0.1)
Immature Granulocytes: 0 %
Iron Saturation: 4 % — CL (ref 15–55)
Iron: 15 ug/dL — ABNORMAL LOW (ref 27–159)
Lymphocytes Absolute: 2.1 10*3/uL (ref 0.7–3.1)
Lymphs: 32 %
MCH: 27 pg (ref 26.6–33.0)
MCHC: 31.3 g/dL — ABNORMAL LOW (ref 31.5–35.7)
MCV: 86 fL (ref 79–97)
Monocytes Absolute: 0.7 10*3/uL (ref 0.1–0.9)
Monocytes: 10 %
Neutrophils Absolute: 3.1 10*3/uL (ref 1.4–7.0)
Neutrophils: 48 %
Platelets: 351 10*3/uL (ref 150–450)
RBC: 3.52 x10E6/uL — ABNORMAL LOW (ref 3.77–5.28)
RDW: 14.8 % (ref 11.7–15.4)
Retic Ct Pct: 2.1 % (ref 0.6–2.6)
Total Iron Binding Capacity: 421 ug/dL (ref 250–450)
UIBC: 406 ug/dL (ref 131–425)
Vitamin B-12: 662 pg/mL (ref 232–1245)
WBC: 6.5 10*3/uL (ref 3.4–10.8)

## 2022-09-08 LAB — VITAMIN D 25 HYDROXY (VIT D DEFICIENCY, FRACTURES): Vit D, 25-Hydroxy: 14.3 ng/mL — ABNORMAL LOW (ref 30.0–100.0)

## 2022-09-08 LAB — THYROID PEROXIDASE ANTIBODY: Thyroperoxidase Ab SerPl-aCnc: 15 IU/mL (ref 0–34)

## 2022-09-08 MED ORDER — VITAMIN D (ERGOCALCIFEROL) 1.25 MG (50000 UNIT) PO CAPS
50000.0000 [IU] | ORAL_CAPSULE | ORAL | 0 refills | Status: DC
  Filled 2022-09-08 – 2022-09-25 (×2): qty 12, 84d supply, fill #0
  Filled 2023-01-20: qty 12, 84d supply, fill #1

## 2022-09-08 NOTE — Addendum Note (Signed)
Addended by: [name] on: 09/08/2022 09:13 AM   Modules accepted: Orders

## 2022-09-08 NOTE — Progress Notes (Signed)
Good morning Nancy Garza. Your hemoglobin and hematocrit have dropped.  I recommend restarting your Ferrous sulfate 325 twice daily.  I have also sent in a prescription for Vitamin D 50,000 IU twice weekly.  I want to see you back in 1 month to recheck your labs.

## 2022-09-14 NOTE — Progress Notes (Deleted)
Nancy Grooms, Nancy Garza   No chief complaint on file.   HPI:      Ms. Nancy Garza is a 42 y.o. No obstetric history on file. whose LMP was No LMP recorded., presents today for Nancy Garza ref for menorrhagia, referred by PCP.  Hx of anemia  Patient Active Problem List   Diagnosis Date Noted   Constipation 01/22/2022   Leg swelling 01/22/2022   H/O menorrhagia 01/06/2022   Pre-diabetes 12/25/2021   Anxiety and depression 12/22/2019   Hyperlipidemia 08/23/2019   Iron deficiency anemia 03/08/2019   Anemia 01/25/2019   Morbid obesity 12/16/2014   Benign hypertension 12/16/2014    No past surgical history on file.  Family History  Problem Relation Age of Onset   Diabetes Mother    Hypertension Mother    Stroke Mother    COPD Mother    Hypertension Father    Diabetes Maternal Grandmother    Hypertension Maternal Grandmother    Stroke Maternal Grandmother    Diabetes Paternal Grandmother    Hypertension Paternal Grandmother    Stroke Paternal Grandmother     Social History   Socioeconomic History   Marital status: Single    Spouse name: Not on file   Number of children: Not on file   Years of education: Not on file   Highest education level: Not on file  Occupational History   Not on file  Tobacco Use   Smoking status: Former    Types: Cigarettes   Smokeless tobacco: Never   Tobacco comments:    hemp CBD  Vaping Use   Vaping Use: Never used  Substance and Sexual Activity   Alcohol use: Yes    Alcohol/week: 3.0 standard drinks of alcohol    Types: 3 Shots of liquor per week    Comment: on the weekends   Drug use: Yes    Types: Marijuana   Sexual activity: Not on file  Other Topics Concern   Not on file  Social History Narrative   Not on file   Social Determinants of Health   Financial Resource Strain: Not on file  Food Insecurity: Not on file  Transportation Needs: Not on file  Physical Activity: Not on file  Stress: Not on file  Social  Connections: Not on file  Intimate Partner Violence: Not on file    Outpatient Medications Prior to Visit  Medication Sig Dispense Refill   citalopram (CELEXA) 20 MG tablet Take 1 tablet (20 mg total) by mouth daily. 90 tablet 0   metoprolol succinate (TOPROL-XL) 100 MG 24 hr tablet Take 1 tablet (100 mg total) by mouth daily. Take with or immediately following a meal. 90 tablet 1   valsartan (DIOVAN) 160 MG tablet Take 1 tablet (160 mg total) by mouth daily. 90 tablet 0   Vitamin D, Ergocalciferol, (DRISDOL) 1.25 MG (50000 UNIT) CAPS capsule Take 1 capsule (50,000 Units total) by mouth every 7 (seven) days. 24 capsule 0   No facility-administered medications prior to visit.      ROS:  Review of Systems BREAST: No symptoms   OBJECTIVE:   Vitals:  There were no vitals taken for this visit.  Physical Exam  Results: No results found for this or any previous visit (from the past 24 hour(s)).   Assessment/Plan: No diagnosis found.    No orders of the defined types were placed in this encounter.     No follow-ups on file.  Sava Proby B. Aniah Pauli, PA-C 09/14/2022 7:57 PM

## 2022-09-15 ENCOUNTER — Encounter: Payer: Commercial Managed Care - PPO | Admitting: Obstetrics and Gynecology

## 2022-09-22 ENCOUNTER — Ambulatory Visit (INDEPENDENT_AMBULATORY_CARE_PROVIDER_SITE_OTHER): Payer: Commercial Managed Care - PPO | Admitting: Obstetrics and Gynecology

## 2022-09-22 ENCOUNTER — Other Ambulatory Visit: Payer: Self-pay

## 2022-09-22 ENCOUNTER — Encounter: Payer: Self-pay | Admitting: Obstetrics and Gynecology

## 2022-09-22 VITALS — BP 197/77 | HR 69 | Ht 66.0 in | Wt 369.3 lb

## 2022-09-22 DIAGNOSIS — Z7689 Persons encountering health services in other specified circumstances: Secondary | ICD-10-CM

## 2022-09-22 DIAGNOSIS — N920 Excessive and frequent menstruation with regular cycle: Secondary | ICD-10-CM

## 2022-09-22 DIAGNOSIS — I10 Essential (primary) hypertension: Secondary | ICD-10-CM | POA: Diagnosis not present

## 2022-09-22 NOTE — Progress Notes (Signed)
HPI:      Nancy Garza is a 42 y.o. G0P0000 who LMP was Patient's last menstrual period was 09/04/2022.  Subjective:   She presents today because she has begun having very heavy menstrual periods.  She reports that they are regular and every month but they have become much heavier recently.  She had a hemoglobin drawn and it was 9.5.  She is currently taking iron. She is a known chronic hypertensive patient and on 2 different medications.  She has a an appointment later today with her family doctor to help control her blood pressure. She reports that she is considering childbirth in the near future and does not want birth control.    Hx: The following portions of the patient's history were reviewed and updated as appropriate:             She  has a past medical history of Abnormal Pap smear of cervix and Hypertension. She does not have any pertinent problems on file. She  has no past surgical history on file. Her family history includes COPD in her mother; Diabetes in her maternal grandmother, mother, and paternal grandmother; Hypertension in her father, maternal grandmother, mother, and paternal grandmother; Stroke in her maternal grandmother, mother, and paternal grandmother. She  reports that she has quit smoking. Her smoking use included cigarettes. She has never used smokeless tobacco. She reports current alcohol use of about 3.0 standard drinks of alcohol per week. She reports current drug use. Drug: Marijuana. She has a current medication list which includes the following prescription(s): citalopram, ferrous sulfate, metoprolol succinate, valsartan, and vitamin d (ergocalciferol). She has No Known Allergies.       Review of Systems:  Review of Systems  Constitutional: Denied constitutional symptoms, night sweats, recent illness, fatigue, fever, insomnia and weight loss.  Eyes: Denied eye symptoms, eye pain, photophobia, vision change and visual disturbance.   Ears/Nose/Throat/Neck: Denied ear, nose, throat or neck symptoms, hearing loss, nasal discharge, sinus congestion and sore throat.  Cardiovascular: Denied cardiovascular symptoms, arrhythmia, chest pain/pressure, edema, exercise intolerance, orthopnea and palpitations.  Respiratory: Denied pulmonary symptoms, asthma, pleuritic pain, productive sputum, cough, dyspnea and wheezing.  Gastrointestinal: Denied, gastro-esophageal reflux, melena, nausea and vomiting.  Genitourinary:  See HPI for additional information.  Musculoskeletal: Denied musculoskeletal symptoms, stiffness, swelling, muscle weakness and myalgia.  Dermatologic: Denied dermatology symptoms, rash and scar.  Neurologic: Denied neurology symptoms, dizziness, headache, neck pain and syncope.  Psychiatric: Denied psychiatric symptoms, anxiety and depression.  Endocrine: Denied endocrine symptoms including hot flashes and night sweats.   Meds:   Current Outpatient Medications on File Prior to Visit  Medication Sig Dispense Refill   citalopram (CELEXA) 20 MG tablet Take 1 tablet (20 mg total) by mouth daily. 90 tablet 0   ferrous sulfate 325 (65 FE) MG tablet Take 325 mg by mouth daily with breakfast.     metoprolol succinate (TOPROL-XL) 100 MG 24 hr tablet Take 1 tablet (100 mg total) by mouth daily. Take with or immediately following a meal. 90 tablet 1   valsartan (DIOVAN) 160 MG tablet Take 1 tablet (160 mg total) by mouth daily. 90 tablet 0   Vitamin D, Ergocalciferol, (DRISDOL) 1.25 MG (50000 UNIT) CAPS capsule Take 1 capsule (50,000 Units total) by mouth every 7 (seven) days. 24 capsule 0   No current facility-administered medications on file prior to visit.      Objective:     Vitals:   09/22/22 1528 09/22/22 1533  BP: Marland Kitchen)  170/90 (!) 197/77  Pulse: 69    Filed Weights   09/22/22 1528  Weight: (!) 369 lb 4.8 oz (167.5 kg)                         Assessment:    G0P0000 Patient Active Problem List    Diagnosis Date Noted   Constipation 01/22/2022   Leg swelling 01/22/2022   H/O menorrhagia 01/06/2022   Pre-diabetes 12/25/2021   Anxiety and depression 12/22/2019   Hyperlipidemia 08/23/2019   Iron deficiency anemia 03/08/2019   Anemia 01/25/2019   Morbid obesity 12/16/2014   Benign hypertension 12/16/2014     1. Establishing care with new doctor, encounter for   2. Menorrhagia with regular cycle   3. Hypertension, unspecified type     Patient is considering pregnancy in the near future does not think she wants birth control at this time.  She certainly does not want endometrial ablation or hysterectomy.   Plan:            1.  We have discussed the causes of menorrhagia.  I have ordered an ultrasound to see if there is any pelvic abnormalities-specifically fibroids.  We have discussed specific cycle control methods including IUD, progesterone only OCPs etc.  Patient does not think she wants to do any of these things because of her desire for children in the near future. We have also discussed the future possibility of endometrial ablation, UFE and hysterectomy.  2.  I have stressed the importance of hypertension follow-up.  She reports that she has a visit later today with her family doctor to help regulate her pressures.   Orders Orders Placed This Encounter  Procedures   US PELVIS (TRANSABDOMINAL ONLY)    No orders of the defined types were placed in this encounter.     F/U  Return for We will contact her with any abnormal test results. I spent 31 minutes involved in the care of this patient preparing to see the patient by obtaining and reviewing her medical history (including labs, imaging tests and prior procedures), documenting clinical information in the electronic health record (EHR), counseling and coordinating care plans, writing and sending prescriptions, ordering tests or procedures and in direct communicating with the patient and medical staff discussing pertinent  items from her history and physical exam.  Elonda Husky, M.D. 09/22/2022 3:57 PM

## 2022-09-22 NOTE — Progress Notes (Signed)
Patient presents today to discuss ongoing menorrhagia. She states since 14 having heavy cycles but over the last two years noticing it has become heavier. She is currently not using anything for birth control and would like to keep it that way. She states no history of fibroids.

## 2022-09-24 ENCOUNTER — Ambulatory Visit (INDEPENDENT_AMBULATORY_CARE_PROVIDER_SITE_OTHER): Payer: Commercial Managed Care - PPO

## 2022-09-24 ENCOUNTER — Ambulatory Visit: Payer: Commercial Managed Care - PPO | Admitting: Nurse Practitioner

## 2022-09-24 DIAGNOSIS — N939 Abnormal uterine and vaginal bleeding, unspecified: Secondary | ICD-10-CM

## 2022-09-24 DIAGNOSIS — N920 Excessive and frequent menstruation with regular cycle: Secondary | ICD-10-CM

## 2022-09-24 NOTE — Progress Notes (Deleted)
LMP 09/04/2022    Subjective:    Patient ID: Nancy Garza, female    DOB: January 07, 1981, 42 y.o.   MRN: [id number]  HPI: Nancy Garza is a 42 y.o. female  No chief complaint on file.  HYPERTENSION without Chronic Kidney Disease Hypertension status: controlled  Satisfied with current treatment? yes Duration of hypertension: years BP monitoring frequency:  not checking BP range:  BP medication side effects:  no Medication compliance: excellent compliance Previous BP meds:metoprolol  and losartan (cozaar) Aspirin: no Recurrent headaches: no Visual changes: no Palpitations: no Dyspnea: no Chest pain: no Lower extremity edema: no Dizzy/lightheaded: no  ANEMIA Anemia status: controlled Etiology of anemia: iDA Duration of anemia treatment:  Compliance with treatment: excellent compliance Iron supplementation side effects: yes Severity of anemia: severe Fatigue: no Decreased exercise tolerance: yes  Dyspnea on exertion: yes Palpitations: no Bleeding: yes- heavy periods Pica: no  Patient states she is concerned about her periods.  They are extremely heaving, she has to wear a pull up for 3 days.  She bleeds for a whole 7 days.  After the 7 days she has a mucousy discharge for about 4 days after her cycle.    MOOD Patient states she feels like the Celexa takes the edge off a little bit.  States she still is worried and irritable a lot.  She still has a lot of extra stress on her.    Flowsheet Row Office Visit from 09/07/2022 in [facility]  PHQ-9 Total Score 14         09/07/2022    3:51 PM 08/13/2022    3:23 PM 06/29/2022    3:22 PM 12/07/2019    9:15 AM  GAD 7 : Generalized Anxiety Score  Nervous, Anxious, on Edge 1 3 0 3  Control/stop worrying Worry too much - different things Trouble relaxing Restless 1 0 3 0  Easily annoyed or irritable Afraid - awful might happen 0 0 3 0  Total GAD 7 Score Anxiety Difficulty Somewhat difficult Somewhat difficult Somewhat difficult Extremely difficult      Relevant past medical, surgical, family and social history reviewed and updated as indicated. Interim medical history since our last visit reviewed. Allergies and medications reviewed and updated.  Review of Systems  Eyes:  Negative for visual disturbance.  Respiratory:  Negative for cough, chest tightness and shortness of breath.   Cardiovascular:  Negative for chest pain, palpitations and leg swelling.  Neurological:  Negative for dizziness and headaches.  Psychiatric/Behavioral:  Positive for dysphoric mood. Negative for suicidal ideas. The patient is nervous/anxious.     Per HPI unless specifically indicated above     Objective:    LMP 09/04/2022   Wt Readings from Last 3 Encounters:  09/22/22 (!) 369 lb 4.8 oz (167.5 kg)  09/07/22 (!) 359 lb 6.4 oz (163 kg)  08/13/22 (!) 361 lb (163.7 kg)    Physical Exam Vitals and nursing note reviewed.  Constitutional:      General: She is not in acute distress.    Appearance: Normal appearance. She is not ill-appearing, toxic-appearing or diaphoretic.  HENT:     Head: Normocephalic.     Right Ear: External ear normal.     Left Ear: External ear normal.     Nose: Nose normal.  Mouth/Throat:     Mouth: Mucous membranes are moist.     Pharynx: Oropharynx is clear.  Eyes:     General:        Right eye: No discharge.        Left eye: No discharge.     Extraocular Movements: Extraocular movements intact.     Conjunctiva/sclera: Conjunctivae normal.     Pupils: Pupils are equal, round, and reactive to light.  Cardiovascular:     Rate and Rhythm: Normal rate and regular rhythm.     Heart sounds: No murmur heard. Pulmonary:     Effort: Pulmonary effort is normal. No respiratory distress.     Breath sounds: Normal breath sounds. No wheezing or rales.  Musculoskeletal:     Cervical back: Normal range of motion and neck  supple.  Skin:    General: Skin is warm and dry.     Capillary Refill: Capillary refill takes less than 2 seconds.  Neurological:     General: No focal deficit present.     Mental Status: She is alert and oriented to person, place, and time. Mental status is at baseline.  Psychiatric:        Mood and Affect: Mood normal.        Behavior: Behavior normal.        Thought Content: Thought content normal.        Judgment: Judgment normal.    Results for orders placed or performed in visit on 09/07/22  Anemia Profile B  Result Value Ref Range   Total Iron Binding Capacity 421 250 - 450 ug/dL   UIBC 960 454 - 098 ug/dL   Iron 15 (L) 27 - 119 ug/dL   Iron Saturation 4 (LL) 15 - 55 %   Ferritin 10 (L) 15 - 150 ng/mL   Vitamin B-12 662 232 - 1,245 pg/mL   Folate 6.7 >3.0 ng/mL   WBC 6.5 3.4 - 10.8 x10E3/uL   RBC 3.52 (L) 3.77 - 5.28 x10E6/uL   Hemoglobin 9.5 (L) 11.1 - 15.9 g/dL   Hematocrit 14.7 (L) 82.9 - 46.6 %   MCV 86 79 - 97 fL   MCH 27.0 26.6 - 33.0 pg   MCHC 31.3 (L) 31.5 - 35.7 g/dL   RDW 56.2 13.0 - 86.5 %   Platelets 351 150 - 450 x10E3/uL   Neutrophils 48 Not Estab. %   Lymphs 32 Not Estab. %   Monocytes 10 Not Estab. %   Eos 9 Not Estab. %   Basos 1 Not Estab. %   Neutrophils Absolute 3.1 1.4 - 7.0 x10E3/uL   Lymphocytes Absolute 2.1 0.7 - 3.1 x10E3/uL   Monocytes Absolute 0.7 0.1 - 0.9 x10E3/uL   EOS (ABSOLUTE) 0.6 (H) 0.0 - 0.4 x10E3/uL   Basophils Absolute 0.0 0.0 - 0.2 x10E3/uL   Immature Granulocytes 0 Not Estab. %   Immature Grans (Abs) 0.0 0.0 - 0.1 x10E3/uL   Retic Ct Pct 2.1 0.6 - 2.6 %  Thyroid Panel With TSH  Result Value Ref Range   TSH 1.730 0.450 - 4.500 uIU/mL   T4, Total 5.9 4.5 - 12.0 ug/dL   T3 Uptake Ratio 28 24 - 39 %   Free Thyroxine Index 1.7 1.2 - 4.9  Thyroid peroxidase antibody  Result Value Ref Range   Thyroperoxidase Ab SerPl-aCnc 15 0 - 34 IU/mL  Vitamin D (25 hydroxy)  Result Value Ref Range   Vit D, 25-Hydroxy 14.3 (L) 30.0 -  100.0 ng/mL  Assessment & Plan:   Problem List Items Addressed This Visit       Cardiovascular and Mediastinum   Benign hypertension - Primary     Other   Anxiety and depression     Follow up plan: No follow-ups on file.

## 2022-09-25 ENCOUNTER — Telehealth: Payer: Self-pay

## 2022-09-25 ENCOUNTER — Other Ambulatory Visit: Payer: Self-pay | Admitting: Nurse Practitioner

## 2022-09-25 ENCOUNTER — Other Ambulatory Visit: Payer: Self-pay

## 2022-09-25 MED ORDER — FERROUS SULFATE 325 (65 FE) MG PO TABS
325.0000 mg | ORAL_TABLET | Freq: Every day | ORAL | 1 refills | Status: DC
  Filled 2022-09-25 – 2022-09-28 (×2): qty 90, 90d supply, fill #0

## 2022-09-25 MED ORDER — TRANEXAMIC ACID 650 MG PO TABS
1300.0000 mg | ORAL_TABLET | Freq: Three times a day (TID) | ORAL | 2 refills | Status: DC
  Filled 2022-09-25: qty 30, 5d supply, fill #0

## 2022-09-25 NOTE — Telephone Encounter (Signed)
[id number] 

## 2022-09-25 NOTE — Telephone Encounter (Signed)
Patient contacted office requesting results of pelvic ultrasound done 09/24/22, please review and advise. KW

## 2022-09-25 NOTE — Telephone Encounter (Signed)
I advised patient of message below, patient states " this is unacceptable, that is not right that my results are back and he has not viewed it, I am a nervous wreck and dont want to wait back to hear from doctor, will you please have another doctor in office review my results and call me back before office closes?" . KW

## 2022-09-25 NOTE — Telephone Encounter (Signed)
Contacted patient regarding her concerns about her ultrasound.  Discussed that preliminary findings revealed some uterine enlargement, no discrete masses (fibroids/polyps) however could not be definitively ruled out at this time.    Patient reports that her cycles typically last 4-5 days, are moderate in flow on Day 1 but extremely heavy on days 2-3, requiring her to wear a diaper. Cycles have worsened over the past year, but has always had a history of heavy cycles. Review of chart notes that she was seen by Dr. Brennan Bailey 3 days ago, and was offered several different treatment options, all of which she declined as they would in some way affect her fertility as she desires to conceive at some point. I reviewed options again, including birth control (which she could easily start and stop on her own when ready to attempt conception) vs Lysteda, which was a non-hormonal option. Patient notes she may like to consider use of Lysteda. Will send handout on medication via Mychart, and prescription to her pharmacy which she can pick up if she desires to utilize. No contraindications to use noted in history. Patient thankful for call back, notes that this has been causing her a lot of anxiety.    Hildred Laser, MD [facility]

## 2022-09-28 ENCOUNTER — Encounter: Payer: Self-pay | Admitting: Nurse Practitioner

## 2022-09-28 ENCOUNTER — Other Ambulatory Visit: Payer: Self-pay

## 2022-09-29 NOTE — Telephone Encounter (Signed)
Attempted to reach patient to schedule an appointment, LVM detail message and asked for her to call office back.  Also put in CRM.

## 2022-09-30 ENCOUNTER — Other Ambulatory Visit: Payer: Self-pay

## 2022-09-30 ENCOUNTER — Emergency Department
Admission: EM | Admit: 2022-09-30 | Discharge: 2022-09-30 | Disposition: A | Discharge status: Home / Self Care | Payer: Commercial Managed Care - PPO | Attending: Emergency Medicine | Admitting: Emergency Medicine

## 2022-09-30 ENCOUNTER — Emergency Department: Payer: Commercial Managed Care - PPO

## 2022-09-30 ENCOUNTER — Encounter: Payer: Self-pay | Admitting: Emergency Medicine

## 2022-09-30 DIAGNOSIS — I10 Essential (primary) hypertension: Secondary | ICD-10-CM

## 2022-09-30 DIAGNOSIS — R059 Cough, unspecified: Secondary | ICD-10-CM | POA: Diagnosis not present

## 2022-09-30 DIAGNOSIS — J069 Acute upper respiratory infection, unspecified: Secondary | ICD-10-CM | POA: Diagnosis not present

## 2022-09-30 DIAGNOSIS — B9789 Other viral agents as the cause of diseases classified elsewhere: Secondary | ICD-10-CM | POA: Insufficient documentation

## 2022-09-30 DIAGNOSIS — J9801 Acute bronchospasm: Secondary | ICD-10-CM

## 2022-09-30 MED ORDER — ALBUTEROL SULFATE HFA 108 (90 BASE) MCG/ACT IN AERS
2.0000 | INHALATION_SPRAY | Freq: Four times a day (QID) | RESPIRATORY_TRACT | 2 refills | Status: DC | PRN
  Filled 2022-09-30: qty 6.7, 30d supply, fill #0

## 2022-09-30 MED ORDER — IPRATROPIUM-ALBUTEROL 0.5-2.5 (3) MG/3ML IN SOLN
3.0000 mL | Freq: Once | RESPIRATORY_TRACT | Status: AC
  Administered 2022-09-30: 3 mL via RESPIRATORY_TRACT
  Filled 2022-09-30: qty 3

## 2022-09-30 MED ORDER — PREDNISONE 50 MG PO TABS
50.0000 mg | ORAL_TABLET | Freq: Every day | ORAL | 0 refills | Status: DC
  Filled 2022-09-30: qty 4, 4d supply, fill #0

## 2022-09-30 MED ORDER — PREDNISONE 20 MG PO TABS
60.0000 mg | ORAL_TABLET | Freq: Once | ORAL | Status: AC
  Administered 2022-09-30: 60 mg via ORAL
  Filled 2022-09-30: qty 3

## 2022-09-30 NOTE — ED Notes (Signed)
COVID swab sent to lab.

## 2022-09-30 NOTE — ED Triage Notes (Signed)
Pt via POV from home. Pt here for cough, nasal congestion, sore throat for the past week. States that cough is productive. Denies fever. Denies hx of CHF/COPD. Pt is A&Ox4 and NAD

## 2022-09-30 NOTE — ED Provider Notes (Signed)
[facility] Provider Note    Event Date/Time   First MD Initiated Contact with Patient 09/30/22 [phone]     (approximate)   History   Cough   HPI  Nancy Garza is a 42 y.o. female with a history of hypertension who presents with complaints of cough, congestion, mild sore throat for 4 to 5 days.  She does report productive cough, has not taken anything for this.  No sick contacts reported.  No lower extremity swelling     Physical Exam   Triage Vital Signs: ED Triage Vitals  Enc Vitals Group     BP 09/30/22 0928 (!) 190/99     Pulse Rate 09/30/22 0928 70     Resp 09/30/22 0928 (!) 22     Temp 09/30/22 0928 (!) 97.5 F (36.4 C)     Temp Source 09/30/22 0928 Oral     SpO2 09/30/22 0928 98 %     Weight 09/30/22 0926 (!) 167.4 kg (369 lb)     Height 09/30/22 0926 1.676 m (5\' 6" )     Head Circumference --      Peak Flow --      Pain Score 09/30/22 0926 0     Pain Loc --      Pain Edu? --      Excl. in GC? --     Most recent vital signs: Vitals:   09/30/22 0928  BP: (!) 190/99  Pulse: 70  Resp: (!) 22  Temp: (!) 97.5 F (36.4 C)  SpO2: 98%     General: Awake, no distress.  CV:  Good peripheral perfusion.  Resp:  Mild tachypnea, scattered mild wheezes abd:  No distention.  Other:  No calf pain or swelling   ED Results / Procedures / Treatments   Labs (all labs ordered are listed, but only abnormal results are displayed) Labs Reviewed - No data to display   EKG     RADIOLOGY Chest x-ray viewed interpret by me, no acute abnormality    PROCEDURES:  Critical Care performed:   Procedures   MEDICATIONS ORDERED IN ED: Medications  ipratropium-albuterol (DUONEB) 0.5-2.5 (3) MG/3ML nebulizer solution 3 mL (3 mLs Nebulization Given 09/30/22 1019)  ipratropium-albuterol (DUONEB) 0.5-2.5 (3) MG/3ML nebulizer solution 3 mL (3 mLs Nebulization Given 09/30/22 1019)  ipratropium-albuterol (DUONEB) 0.5-2.5 (3) MG/3ML nebulizer  solution 3 mL (3 mLs Nebulization Given 09/30/22 1111)  predniSONE (DELTASONE) tablet 60 mg (60 mg Oral Given 09/30/22 1111)     IMPRESSION / MDM / ASSESSMENT AND PLAN / ED COURSE  I reviewed the triage vital signs and the nursing notes. Patient's presentation is most consistent with acute illness / injury with system symptoms.  Patient presents with likely viral symptoms, differential includes upper respiratory infection, bronchitis, bronchospasm  She is hypertensive, she has a history of high blood pressure and reports poor blood pressure control, her PCP is adjusting her medications  Suspect upper respiratory infection with bronchospasm, smoking cessation discussed with patient, she reports she has been off cigarettes for a week and I strongly encouraged her to continue that  Will treat with DuoNebs and reevaluate, chest x-ray negative for pneumonia  Patient still with some wheezing after DuoNeb's although better airflow, additional DuoNeb given with p.o. prednisone  Wheezing significantly better, patient is feeling much better.  Will discharge with Rx for prednisone, Ventolin inhaler, return precautions discussed      FINAL CLINICAL IMPRESSION(S) / ED DIAGNOSES   Final diagnoses:  Viral  URI with cough  Bronchospasm  Primary hypertension     Rx / DC Orders   ED Discharge Orders          Ordered    predniSONE (DELTASONE) 50 MG tablet  Daily with breakfast        09/30/22 1209    albuterol (VENTOLIN HFA) 108 (90 Base) MCG/ACT inhaler  Every 6 hours PRN        09/30/22 1209             Note:  This document was prepared using Dragon voice recognition software and may include unintentional dictation errors.   Jene Every, MD 09/30/22 [phone]

## 2022-10-21 ENCOUNTER — Other Ambulatory Visit: Payer: Self-pay

## 2022-10-30 ENCOUNTER — Other Ambulatory Visit: Payer: Self-pay

## 2022-10-30 ENCOUNTER — Emergency Department
Admission: EM | Admit: 2022-10-30 | Discharge: 2022-10-30 | Disposition: A | Discharge status: Home / Self Care | Payer: Commercial Managed Care - PPO | Attending: Emergency Medicine | Admitting: Emergency Medicine

## 2022-10-30 DIAGNOSIS — N939 Abnormal uterine and vaginal bleeding, unspecified: Secondary | ICD-10-CM | POA: Insufficient documentation

## 2022-10-30 DIAGNOSIS — I10 Essential (primary) hypertension: Secondary | ICD-10-CM | POA: Insufficient documentation

## 2022-10-30 LAB — TYPE AND SCREEN
ABO/RH(D): A POS
Antibody Screen: NEGATIVE

## 2022-10-30 LAB — CBC
HCT: 35.2 % — ABNORMAL LOW (ref 36.0–46.0)
Hemoglobin: 11.2 g/dL — ABNORMAL LOW (ref 12.0–15.0)
MCH: 27.8 pg (ref 26.0–34.0)
MCHC: 31.8 g/dL (ref 30.0–36.0)
MCV: 87.3 fL (ref 80.0–100.0)
Platelets: 338 10*3/uL (ref 150–400)
RBC: 4.03 MIL/uL (ref 3.87–5.11)
RDW: 20.9 % — ABNORMAL HIGH (ref 11.5–15.5)
WBC: 7.4 10*3/uL (ref 4.0–10.5)
nRBC: 0.3 % — ABNORMAL HIGH (ref 0.0–0.2)

## 2022-10-30 MED ORDER — MEDROXYPROGESTERONE ACETATE 10 MG PO TABS
10.0000 mg | ORAL_TABLET | Freq: Three times a day (TID) | ORAL | 0 refills | Status: DC
  Filled 2022-10-30: qty 21, 7d supply, fill #0

## 2022-10-30 NOTE — Discharge Instructions (Addendum)
Please take the medication as prescribed to help with your bleeding.  Please follow-up with OB/GYN for endometrial biopsy.  They are aware of you and will reach out to help schedule this appointment for you.  Please return for any new, worsening, or change in symptoms or other concerns.  It was a pleasure caring for you today.

## 2022-10-30 NOTE — ED Triage Notes (Signed)
Pt states she gone home from work twice this week for heavy bleeding. Pt was given ultrasound- placed on tranexamic acid which pt states has mad bleeding worse. Pt's current period started x 6 days heavily and has to wear pull-ups or two pads. Pt denies blood clots. Pt denies dizziness, SHOB, CP. Pt c/o fatigue.

## 2022-10-30 NOTE — ED Provider Notes (Signed)
[facility] Provider Note    Event Date/Time   First MD Initiated Contact with Patient 10/30/22 1400     (approximate)   History   Vaginal Bleeding   HPI  Nancy Garza is a 42 y.o. female with a past medical history of hypertension who presents today for vaginal bleeding.  She reports that she has had very heavy menstrual periods, but recently they have become heavier.  She reports that she took TXA for this problem 1 month ago, and her bleeding improved.  She reports that her recent period started again, and she has had bleeding again, but this time the TXA is not working.  She reports that she has been bleeding for 5 days.  She denies pain.  She reports that she has gone through approximately 6 pads today.  No fevers or chills.  She has never been pregnant before.     Physical Exam   Triage Vital Signs: ED Triage Vitals  Enc Vitals Group     BP 10/30/22 1326 (!) 192/113     Pulse Rate 10/30/22 1326 65     Resp 10/30/22 1326 19     Temp 10/30/22 1326 97.7 F (36.5 C)     Temp Source 10/30/22 1326 Oral     SpO2 10/30/22 1326 97 %     Weight --      Height --      Head Circumference --      Peak Flow --      Pain Score 10/30/22 1327 0     Pain Loc --      Pain Edu? --      Excl. in GC? --     Most recent vital signs: Vitals:   10/30/22 1326  BP: (!) 192/113  Pulse: 65  Resp: 19  Temp: 97.7 F (36.5 C)  SpO2: 97%    Physical Exam Vitals and nursing note reviewed.  Constitutional:      General: Awake and alert. No acute distress.    Appearance: Normal appearance. The patient is obese.  HENT:     Head: Normocephalic and atraumatic.     Mouth: Mucous membranes are moist.  Eyes:     General: PERRL. Normal EOMs        Right eye: No discharge.        Left eye: No discharge.     Conjunctiva/sclera: Conjunctivae normal.  Cardiovascular:     Rate and Rhythm: Normal rate and regular rhythm.     Pulses: Normal pulses.  Pulmonary:      Effort: Pulmonary effort is normal. No respiratory distress.     Breath sounds: Normal breath sounds.  Abdominal:     Abdomen is soft. There is no abdominal tenderness. No rebound or guarding. No distention. Musculoskeletal:        General: No swelling. Normal range of motion.     Cervical back: Normal range of motion and neck supple.  Skin:    General: Skin is warm and dry.     Capillary Refill: Capillary refill takes less than 2 seconds.     Findings: No rash.  Neurological:     Mental Status: The patient is awake and alert.      ED Results / Procedures / Treatments   Labs (all labs ordered are listed, but only abnormal results are displayed) Labs Reviewed  CBC - Abnormal; Notable for the following components:      Result Value   Hemoglobin 11.2 (*)  HCT 35.2 (*)    RDW 20.9 (*)    nRBC 0.3 (*)    All other components within normal limits  POC URINE PREG, ED  TYPE AND SCREEN     EKG     RADIOLOGY Recent ultrasound reviewed agree with radiologist findings    PROCEDURES:  Critical Care performed:   Procedures   MEDICATIONS ORDERED IN ED: Medications - No data to display   IMPRESSION / MDM / ASSESSMENT AND PLAN / ED COURSE  I reviewed the triage vital signs and the nursing notes.   Differential diagnosis includes, but is not limited to, endometrial cancer, abnormal uterine bleeding, anemia.  I reviewed the patient's chart.  She had an ultrasound on 09/24/2022 which revealed an extremely enlarged uterus with either fibroids or endometrial pathology, with endometrial biopsy warranted.  Patient is awake and alert, hemodynamically stable and afebrile.  She is noted to be hypertensive.  Vital signs do not reflect prerenal volume depletion or significant anemia.  Labs obtained are reassuring.  Her H&H is similar to previous.  I reviewed her ultrasound from last month which revealed an extremely enlarged heterogeneous uterus, unable to differentiate  fibroids versus endometrial pathology.  I discussed with OB/GYN regarding management, and they recommend starting Provera 10 mg 3 times daily for 5 to 10 days, and they will arrange endometrial biopsy.  I discussed with the patient who agrees with this plan.  She understands return precautions in the meantime.  She was discharged in stable condition.  Patient's presentation is most consistent with acute complicated illness / injury requiring diagnostic workup.  Clinical Course as of 10/30/22 1513  Fri Oct 30, 2022  1502 Discussed with OB/GYN, plan for Provera and they will arrange outpatient follow-up for endometrial biopsy [JP]    Clinical Course User Index [JP] Buckley Bradly, Herb Grays, PA-C     FINAL CLINICAL IMPRESSION(S) / ED DIAGNOSES   Final diagnoses:  Vaginal bleeding     Rx / DC Orders   ED Discharge Orders          Ordered    medroxyPROGESTERone (PROVERA) 10 MG tablet  3 times daily        10/30/22 1510             Note:  This document was prepared using Dragon voice recognition software and may include unintentional dictation errors.   Jackelyn Hoehn, PA-C 10/30/22 1513    Corena Herter, MD 10/30/22 1517

## 2022-10-30 NOTE — ED Notes (Signed)
Patient was discharged by the provider.

## 2022-11-02 ENCOUNTER — Telehealth: Payer: Self-pay

## 2022-11-02 NOTE — Transitions of Care (Post Inpatient/ED Visit) (Unsigned)
   11/02/2022  Name: TANELL WITTSTRUCK MRN: [id number] DOB: 1980/06/18  Today's TOC FU Call Status: Today's TOC FU Call Status:: Unsuccessul Call (1st Attempt) Unsuccessful Call (1st Attempt) Date: 11/02/22  Attempted to reach the patient regarding the most recent Inpatient/ED visit.  Follow Up Plan: Additional outreach attempts will be made to reach the patient to complete the Transitions of Care (Post Inpatient/ED visit) call.   Signature: Wilhemena Durie, CMA

## 2022-11-03 ENCOUNTER — Ambulatory Visit (INDEPENDENT_AMBULATORY_CARE_PROVIDER_SITE_OTHER): Payer: Commercial Managed Care - PPO | Admitting: Certified Nurse Midwife

## 2022-11-03 ENCOUNTER — Encounter: Payer: Self-pay | Admitting: Physician Assistant

## 2022-11-03 ENCOUNTER — Encounter: Payer: Self-pay | Admitting: Certified Nurse Midwife

## 2022-11-03 ENCOUNTER — Ambulatory Visit: Payer: Commercial Managed Care - PPO | Admitting: Physician Assistant

## 2022-11-03 ENCOUNTER — Other Ambulatory Visit: Payer: Self-pay

## 2022-11-03 ENCOUNTER — Ambulatory Visit: Payer: Self-pay | Admitting: *Deleted

## 2022-11-03 VITALS — BP 194/92 | HR 84 | Resp 16 | Ht 66.0 in | Wt 357.0 lb

## 2022-11-03 VITALS — BP 182/118 | HR 87 | Wt 357.7 lb

## 2022-11-03 DIAGNOSIS — N939 Abnormal uterine and vaginal bleeding, unspecified: Secondary | ICD-10-CM | POA: Diagnosis not present

## 2022-11-03 DIAGNOSIS — I10 Essential (primary) hypertension: Secondary | ICD-10-CM | POA: Diagnosis not present

## 2022-11-03 MED ORDER — AMLODIPINE BESYLATE 5 MG PO TABS
5.0000 mg | ORAL_TABLET | Freq: Every day | ORAL | 0 refills | Status: DC
Start: 2022-11-03 — End: 2022-12-11
  Filled 2022-11-03: qty 30, 30d supply, fill #0

## 2022-11-03 MED ORDER — OXYCODONE-ACETAMINOPHEN 7.5-325 MG PO TABS
1.0000 | ORAL_TABLET | ORAL | 0 refills | Status: DC | PRN
  Filled 2022-11-03: qty 4, 1d supply, fill #0

## 2022-11-03 MED ORDER — HYDROCHLOROTHIAZIDE 12.5 MG PO CAPS
12.5000 mg | ORAL_CAPSULE | Freq: Every day | ORAL | 0 refills | Status: DC
Start: 2022-11-03 — End: 2022-12-11
  Filled 2022-11-03: qty 30, 30d supply, fill #0

## 2022-11-03 NOTE — Patient Instructions (Signed)
Endometrial Biopsy  An endometrial biopsy is a procedure to remove tissue samples from the endometrium, which is the lining of the uterus. The tissue that is removed can then be checked under a microscope for disease. This procedure is used to diagnose conditions such as endometrial cancer, endometrial tuberculosis, polyps, or other inflammatory conditions. This procedure may also be used to investigate uterine bleeding to determine where you are in your menstrual cycle or how your hormone levels are affecting the lining of the uterus. Tell a health care provider about: Any allergies you have. All medicines you are taking, including vitamins, herbs, eye drops, creams, and over-the-counter medicines. Any problems you or family members have had with anesthetic medicines. Any bleeding problems you have. Any surgeries you have had. Any medical conditions you have. Whether you are pregnant or may be pregnant. What are the risks? Your health care provider will talk with you about risks. These may include: Bleeding. Pelvic infection. Puncture of the wall of the uterus with the biopsy device (rare). Allergic reactions to medicines. What happens before the procedure? Keep a record of your menstrual cycles as told by your health care provider. You may need to schedule your procedure for a specific time in your cycle. Bring a sanitary pad in case you need to wear one after the procedure. Ask your health care provider about: Changing or stopping your regular medicines. These include any diabetes medicines or blood thinners you take. Taking medicines such as aspirin and ibuprofen. These medicines can thin your blood. Do not take these medicines unless your health care provider tells you to. Taking over-the-counter medicines, vitamins, herbs, and supplements. Plan to have someone take you home from the hospital or clinic. What happens during the procedure? You will lie on an exam table with your feet  and legs supported as in a pelvic exam. Your health care provider will insert an instrument into your vagina to see your cervix. Your cervix will be cleansed with an antiseptic solution. A medicine (local anesthetic) will be used to numb the cervix. A forceps instrument will be used to hold your cervix steady for the biopsy. A thin, rod-like instrument (uterine sound) will be inserted through your cervix to determine the length of your uterus and the location where the biopsy sample will be removed. A thin, flexible tube (catheter) will be inserted through your cervix and into the uterus. The catheter will be used to collect the biopsy sample from your endometrial tissue. The tube and instruments will be removed, and the tissue sample will be sent to a lab for examination. The procedure may vary among health care providers and hospitals. What happens after the procedure? Your blood pressure, heart rate, breathing rate, and blood oxygen level will be monitored until you leave the hospital or clinic. It is up to you to get the results of your procedure. Ask your health care provider, or the department that is doing the procedure, when your results will be ready. Summary An endometrial biopsy is a procedure to remove tissue samples from the endometrium, which is the lining of the uterus. This procedure is used to diagnose conditions such as endometrial cancer, endometrial tuberculosis, polyps, or other inflammatory conditions. It is up to you to get the results of your procedure. Ask your health care provider, or the department that is doing the procedure, when your results will be ready. This information is not intended to replace advice given to you by your health care provider. Make sure you   discuss any questions you have with your health care provider. Document Revised: 09/02/2021 Document Reviewed: 09/02/2021 Elsevier Patient Education  2024 [employer] Inc[employer]  

## 2022-11-03 NOTE — Telephone Encounter (Signed)
  Chief Complaint: elevated BP at GYN appointment Symptoms: uncontrolled BP on medication - patient reports she did not take her BP medication this morning- but reports she always gets triple numbers when it is checked - and it was elevated at last ED visit as well Frequency: ongoing- elevated today at provider appointment Pertinent Negatives: Patient denies other symptoms- slight headache only Disposition: [] ED /[] Urgent Care (no appt availability in office) / [x] Appointment(In office/virtual)/ []  [facility]/ [] Home Care/ [] Refused Recommended Disposition /[] [facility]/ []  Follow-up with PCP Additional Notes: Patient needs follow up for BP- was at GYN appointment and her BP was elevated- she has not taken her medication when checked- but she has taken it now and she states it has never been controled. No open appointment at PCP- scheduled with float provider- location address provided

## 2022-11-03 NOTE — Transitions of Care (Post Inpatient/ED Visit) (Unsigned)
   11/03/2022  Name: Nancy Garza MRN: [id number] DOB: 21-Nov-1980  Today's TOC FU Call Status: Today's TOC FU Call Status:: Unsuccessful Call (2nd Attempt) Unsuccessful Call (1st Attempt) Date: 11/02/22 Unsuccessful Call (2nd Attempt) Date: 11/03/22  Attempted to reach the patient regarding the most recent Inpatient/ED visit.  Follow Up Plan: No further outreach attempts will be made at this time. We have been unable to contact the patient.  Signature: Wilhemena Durie, CMA

## 2022-11-03 NOTE — Progress Notes (Signed)
GYN ENCOUNTER NOTE  Subjective:       Nancy Garza is a 42 y.o. G0P0000 female is here for gynecologic evaluation of the following issues:  1. Follow up ED , heavy vaginal bleeding. Pt was seen by Dr. Logan Bores 09/22/22 for the same concern at that time she was counseled on treatment options and pelvic u/s was ordered. Since that visit she was seen in ED 5/31 for bleeding that had become heavier. At that time she was ordered provera, that she has not taken. She states that she was having a lot of cramping but recently passed a large clots and the cramping and bleeding have improved.    Gynecologic History Patient's last menstrual period was 10/25/2022 (exact date). Contraception: none Last Pap: 06/29/2022. Results were: normal Last mammogram: ordered   Obstetric History OB History  Gravida Para Term Preterm AB Living  0 0 0 0 0 0  SAB IAB Ectopic Multiple Live Births  0 0 0 0 0    Past Medical History:  Diagnosis Date   Abnormal Pap smear of cervix    LEEP procedure   Hypertension     No past surgical history on file.  Current Outpatient Medications on File Prior to Visit  Medication Sig Dispense Refill   albuterol (VENTOLIN HFA) 108 (90 Base) MCG/ACT inhaler Inhale 2 puffs into the lungs every 6 (six) hours as needed for wheezing or shortness of breath. 6.7 g 2   citalopram (CELEXA) 20 MG tablet Take 1 tablet (20 mg total) by mouth daily. 90 tablet 0   ferrous sulfate 325 (65 FE) MG tablet Take 1 tablet (325 mg total) by mouth daily with breakfast. 90 tablet 1   medroxyPROGESTERone (PROVERA) 10 MG tablet Take 1 tablet (10 mg total) by mouth in the morning, at noon, and at bedtime for 7 days. 21 tablet 0   metoprolol succinate (TOPROL-XL) 100 MG 24 hr tablet Take 1 tablet (100 mg total) by mouth daily. Take with or immediately following a meal. 90 tablet 1   predniSONE (DELTASONE) 50 MG tablet Take 1 tablet (50 mg total) by mouth daily with breakfast. 4 tablet 0   tranexamic acid  (LYSTEDA) 650 MG TABS tablet Take 2 tablets (1,300 mg total) by mouth 3 (three) times daily. Take during menses for a maximum of five days 30 tablet 2   valsartan (DIOVAN) 160 MG tablet Take 1 tablet (160 mg total) by mouth daily. 90 tablet 0   Vitamin D, Ergocalciferol, (DRISDOL) 1.25 MG (50000 UNIT) CAPS capsule Take 1 capsule (50,000 Units total) by mouth every 7 (seven) days. 24 capsule 0   No current facility-administered medications on file prior to visit.    No Known Allergies  Social History   Socioeconomic History   Marital status: Single    Spouse name: Not on file   Number of children: Not on file   Years of education: Not on file   Highest education level: Not on file  Occupational History   Not on file  Tobacco Use   Smoking status: Former    Types: Cigarettes   Smokeless tobacco: Never   Tobacco comments:    hemp CBD  Vaping Use   Vaping Use: Never used  Substance and Sexual Activity   Alcohol use: Yes    Alcohol/week: 3.0 standard drinks of alcohol    Types: 3 Shots of liquor per week    Comment: on the weekends   Drug use: Yes    Types:  Marijuana   Sexual activity: Not Currently    Birth control/protection: None  Other Topics Concern   Not on file  Social History Narrative   Not on file   Social Determinants of Health   Financial Resource Strain: Not on file  Food Insecurity: Not on file  Transportation Needs: Not on file  Physical Activity: Not on file  Stress: Not on file  Social Connections: Not on file  Intimate Partner Violence: Not on file    Family History  Problem Relation Age of Onset   Diabetes Mother    Hypertension Mother    Stroke Mother    COPD Mother    Hypertension Father    Diabetes Maternal Grandmother    Hypertension Maternal Grandmother    Stroke Maternal Grandmother    Diabetes Paternal Grandmother    Hypertension Paternal Grandmother    Stroke Paternal Grandmother     The following portions of the patient's  history were reviewed and updated as appropriate: allergies, current medications, past family history, past medical history, past social history, past surgical history and problem list.  Review of Systems Review of Systems - Negative except as mentioned in HPI Review of Systems - General ROS: negative for - chills, fatigue, fever, hot flashes, malaise or night sweats Hematological and Lymphatic ROS: negative for - bleeding problems or swollen lymph nodes Gastrointestinal ROS: negative for - abdominal pain, blood in stools, change in bowel habits and nausea/vomiting Musculoskeletal ROS: negative for - joint pain, muscle pain or muscular weakness Genito-Urinary ROS: negative for - change in menstrual cycle, dysmenorrhea, dyspareunia, dysuria, genital discharge, genital ulcers, hematuria, incontinence, irregular, nocturia or pelvic pain.  Positive for heavy menstrual bleeding   Objective:   BP (!) 182/118   Pulse 87   Wt (!) 357 lb 11.2 oz (162.3 kg)   LMP 10/25/2022 (Exact Date)   BMI 57.73 kg/m  CONSTITUTIONAL: Well-developed, well-nourished female in no acute distress.  HENT:  Normocephalic, atraumatic.  NECK: Normal range of motion, supple, no masses.  Normal thyroid.  SKIN: Skin is warm and dry. No rash noted. Not diaphoretic. No erythema. No pallor. NEUROLGIC: Alert and oriented to person, place, and time. PSYCHIATRIC: Normal mood and affect. Normal behavior. Normal judgment and thought content. CARDIOVASCULAR:Not Examined RESPIRATORY: Not Examined BREASTS: Not Examined ABDOMEN: Soft, non distended; Non tender.  No Organomegaly. PELVIC:not indicated today. MUSCULOSKELETAL: Normal range of motion. No tenderness.  No cyanosis, clubbing, or edema.  ULTRASOUND REPORT   [facility] Date of Service: 09/24/2022      Indications:Abnormal Uterine Bleeding Findings:  The uterus is anteverted and measures 14.4 x 16.95 x 12.41 cm. Echo texture is heterogenous  with evidence of focal masses. Extremely enlarged herterogenous uterus, unable to differentiate fibroids vs endometrial pathology    The Endometrium measures 35.84 mm.   Right Ovary not well visualized Left Ovary measures 2.76 x 1.83 x 1.56 cm. It is normal in appearance. Survey of the adnexa demonstrates no adnexal masses. There is no free fluid in the cul de sac.   Impression: 1. Extremely enlarged herterogenous uterus, unable to differentiate fibroids vs endometrial pathology    Recommendations: 1.Clinical correlation with the patient's History and Physical Exam. 2. Follow up with provider for recommendations 3. Endometrial bx likely warranted   Waldo Laine, RT   The ultrasound images and findings were reviewed by me and I agree with the above report.   Elonda Husky, M.D. 10/01/2022 4:08 PM   Assessment:  Abnormal uterine bleeding    Plan:   Reviewed options for treatment abnormal uterine bleeding including those previously reviewed by Dr. Logan Bores. Discussed taking provera as ordered by ED. Discussed recommendation for endometrial biopsy due to u/s results of endometrial strip 35.62mm. Discussed procedure risk & benefits. Pt declines today. States she will come back a due it at later date. Percocet order for her to take per and post procedure as needed. Discussed elevated BP, encouraged pt to call her PCP today to be evaluated. Reviewed red flag symptoms and recommendation for follow up in ED should any of the symptoms present. She verbalizes and agrees to plan.   Dr. Valentino Saxon consulted.   Face to face time 20 min.   Doreene Burke, CNM

## 2022-11-03 NOTE — Assessment & Plan Note (Signed)
Chronic, historic condition, does not appear well controlled on current regimen She is currently taking Valsartan 160 mg PO QD and Metoprolol 100 mg PO QD and is tolerating well Chart review demonstrates that she was taking metoprolol, Amlodipine and chlorthalidone in 2021 and BP was averaging 140s/90s  Will add Amlodipine 5 mg PO QD and HCTZ 12.5 mg PO QD to current regimen with goal of improving BP  Recommend she gets an arm cuff BP monitor to take BP measures at home daily - we discussed appropriate sizing for her arm for most accurate measurement Recommend she takes BP daily and records for review with provider at follow up  If BP is not improving with these measures, I will recommend referral to Advanced HTN clinic and discuss evaluation options for secondary HTN  We discussed signs of low BP as well as ED and return precautions Follow up in 4 weeks or sooner if concerns arise

## 2022-11-03 NOTE — Patient Instructions (Signed)
Your blood pressure was elevated today.  ?If possible please take it at home using an electronic blood pressure cuff for the upper arm ?Record your blood pressure once per day and bring them back with you to your apt so we can make sure you are not developing high blood pressure.  ? ?Incorporating a minimum of 150 minutes (20-30 minutes per day) of moderate intensity physical activity can help improve your heart health and reduce the chances of high blood pressure and other cardiovascular risks. ?Incorporating a heart healthy diet can also help reduce the chances of heart attack and high cholesterol. ? ?

## 2022-11-03 NOTE — Telephone Encounter (Signed)
Reason for Disposition  [1] Systolic BP  >= 200 OR Diastolic >= 120 AND [2] having NO cardiac or neurologic symptoms  Answer Assessment - Initial Assessment Questions 1. BLOOD PRESSURE: "What is the blood pressure?" "Did you take at least two measurements 5 minutes apart?"     207/98, 182/118 2. ONSET: "When did you take your blood pressure?"     Today at [facility] office 3. HOW: "How did you take your blood pressure?" (e.g., automatic home BP monitor, visiting nurse)     Automatic cuff, manual check 4. HISTORY: "Do you have a history of high blood pressure?"     yes 5. MEDICINES: "Are you taking any medicines for blood pressure?" "Have you missed any doses recently?"     Uncontrolled on medication- did not take medication today 6. OTHER SYMPTOMS: "Do you have any symptoms?" (e.g., blurred vision, chest pain, difficulty breathing, headache, weakness)     Headache-slight  Protocols used: Blood Pressure - High-A-AH

## 2022-11-03 NOTE — Progress Notes (Signed)
Acute Office Visit   Patient: Nancy Garza   DOB: 1981/05/12   41 y.o. Female  MRN: [id number] Visit Date: 11/03/2022  Today's healthcare provider: Oswaldo Conroy Douglass [place], PA-C  Introduced myself to the patient as a Secondary school teacher and provided education on APPs in clinical practice.    Chief Complaint  Patient presents with   Hypertension   Subjective    HPI    Hypertension: - Medications: Valsartan 160 mg PO QD, Metoprolol 100 mg PO QD She reports she was on Amlodipine but this caused swelling - she thinks this was not an issue when it was paired with a diuretic  She reports her BP has not been in goal over the last few years   - Compliance: good compliance  - Checking BP at home: several times per week - always 3-digit numbers on top and bottom   She reports frequent SOBOE and fatigue but thinks this is related to her anemia  She reports a hx of anemia and has recently restarted her iron supplements   Medications: Outpatient Medications Prior to Visit  Medication Sig   albuterol (VENTOLIN HFA) 108 (90 Base) MCG/ACT inhaler Inhale 2 puffs into the lungs every 6 (six) hours as needed for wheezing or shortness of breath.   citalopram (CELEXA) 20 MG tablet Take 1 tablet (20 mg total) by mouth daily.   ferrous sulfate 325 (65 FE) MG tablet Take 1 tablet (325 mg total) by mouth daily with breakfast.   medroxyPROGESTERone (PROVERA) 10 MG tablet Take 1 tablet (10 mg total) by mouth in the morning, at noon, and at bedtime for 7 days.   metoprolol succinate (TOPROL-XL) 100 MG 24 hr tablet Take 1 tablet (100 mg total) by mouth daily. Take with or immediately following a meal.   oxyCODONE-acetaminophen (PERCOCET) 7.5-325 MG tablet Take 1-2 tablets by mouth prior to procedure and take 1-2 tablets 4 hours post procedure as needed for pain   predniSONE (DELTASONE) 50 MG tablet Take 1 tablet (50 mg total) by mouth daily with breakfast.   tranexamic acid (LYSTEDA) 650 MG TABS tablet Take 2  tablets (1,300 mg total) by mouth 3 (three) times daily. Take during menses for a maximum of five days   valsartan (DIOVAN) 160 MG tablet Take 1 tablet (160 mg total) by mouth daily.   Vitamin D, Ergocalciferol, (DRISDOL) 1.25 MG (50000 UNIT) CAPS capsule Take 1 capsule (50,000 Units total) by mouth every 7 (seven) days.   No facility-administered medications prior to visit.    Review of Systems  Eyes:  Negative for photophobia and visual disturbance.  Respiratory:  Positive for shortness of breath (especially with exertion. She reports hx of anemia).   Cardiovascular:  Negative for chest pain and palpitations.  Neurological:  Positive for headaches (has currently). Negative for dizziness and light-headedness.       Objective    BP (!) 194/92   Pulse 84   Resp 16   Ht 5\' 6"  (1.676 m)   Wt (!) 357 lb (161.9 kg)   LMP 10/25/2022 (Exact Date)   BMI 57.62 kg/m    Physical Exam Vitals reviewed.  Constitutional:      General: She is awake.     Appearance: Normal appearance. She is well-developed and well-groomed.  HENT:     Head: Normocephalic and atraumatic.  Cardiovascular:     Rate and Rhythm: Normal rate and regular rhythm.     Pulses: Normal pulses.  Radial pulses are 2+ on the right side and 2+ on the left side.     Heart sounds: Normal heart sounds. No murmur heard.    No friction rub. No gallop.  Musculoskeletal:     Cervical back: Normal range of motion and neck supple.     Right lower leg: 1+ Pitting Edema present.     Left lower leg: 1+ Pitting Edema present.  Skin:    General: Skin is warm and dry.  Neurological:     General: No focal deficit present.     Mental Status: She is alert and oriented to person, place, and time.  Psychiatric:        Mood and Affect: Mood normal.        Behavior: Behavior normal. Behavior is cooperative.        Thought Content: Thought content normal.       No results found for any visits on 11/03/22.  Assessment &  Plan      Return in about 4 weeks (around 12/01/2022) for HTN- med changes .       Problem List Items Addressed This Visit       Cardiovascular and Mediastinum   Benign hypertension - Primary    Chronic, historic condition, does not appear well controlled on current regimen She is currently taking Valsartan 160 mg PO QD and Metoprolol 100 mg PO QD and is tolerating well Chart review demonstrates that she was taking metoprolol, Amlodipine and chlorthalidone in 2021 and BP was averaging 140s/90s  Will add Amlodipine 5 mg PO QD and HCTZ 12.5 mg PO QD to current regimen with goal of improving BP  Recommend she gets an arm cuff BP monitor to take BP measures at home daily - we discussed appropriate sizing for her arm for most accurate measurement Recommend she takes BP daily and records for review with provider at follow up  If BP is not improving with these measures, I will recommend referral to Advanced HTN clinic and discuss evaluation options for secondary HTN  We discussed signs of low BP as well as ED and return precautions Follow up in 4 weeks or sooner if concerns arise        Relevant Medications   amLODipine (NORVASC) 5 MG tablet   hydrochlorothiazide (MICROZIDE) 12.5 MG capsule     Return in about 4 weeks (around 12/01/2022) for HTN- med changes .   I, Kalana Yust E Amarachi Kotz, PA-C, have reviewed all documentation for this visit. The documentation on 11/03/22 for the exam, diagnosis, procedures, and orders are all accurate and complete.   Jacquelin Hawking, MHS, PA-C [facility]

## 2022-11-04 NOTE — Transitions of Care (Post Inpatient/ED Visit) (Signed)
   11/04/2022  Name: Nancy Garza MRN: [id number] DOB: [birth year]  Today's TOC FU Call Status: Today's TOC FU Call Status:: Unsuccessful Call (3rd Attempt) Unsuccessful Call (1st Attempt) Date: 11/02/22 Unsuccessful Call (2nd Attempt) Date: 11/03/22 Unsuccessful Call (3rd Attempt) Date: 11/04/22  Attempted to reach the patient regarding the most recent Inpatient/ED visit.  Follow Up Plan: No further outreach attempts will be made at this time. We have been unable to contact the patient.  Signature: Wilhemena Durie, CMA

## 2022-11-17 ENCOUNTER — Telehealth: Payer: Commercial Managed Care - PPO | Admitting: Physician Assistant

## 2022-11-17 DIAGNOSIS — M545 Low back pain, unspecified: Secondary | ICD-10-CM

## 2022-11-17 NOTE — Progress Notes (Signed)
Because of severity of pain with numbness/altered sensation and no improvement with your pain medications, I feel your condition warrants further evaluation and I recommend that you be seen in a face to face visit.   NOTE: There will be NO CHARGE for this eVisit   If you are having a true medical emergency please call 911.      For an urgent face to face visit, [facility] has eight urgent care centers for your convenience:   NEW!! [facility] Health Urgent Care Center at [facility] Get Driving Directions [phone] [address], [address] [place], [address]    [facility] Health Urgent Care Center at [facility] Get Driving Directions [phone] [address] [place], [place] [address]   [facility] Health Urgent Care Center [facility]) Get Driving Directions [phone] [address] [place], [place] [address]  [facility] Health Urgent Care Center [facility]) Get Driving Directions [phone] [address] [place],  [place]  [address]  [facility] Health Urgent Care Center [facility] - at [employer]  [phone] [address] [place],  [place] [address]   [facility] Health Urgent Care at [facility] Get Driving Directions [phone] [address] [place], [place] [address]   [facility] Health Urgent Care at [facility] Get Driving Directions  [phone] [address] [place], [place] [address]   [facility] Health Urgent Care at [facility] Directions [phone] [address] [place], [place] [address]  Your MyChart E-visit questionnaire answers were reviewed by a board certified advanced clinical practitioner to complete your personal care plan based on your specific symptoms.  Thank you for using e-Visits.

## 2022-11-30 DIAGNOSIS — N939 Abnormal uterine and vaginal bleeding, unspecified: Secondary | ICD-10-CM

## 2022-11-30 HISTORY — DX: Abnormal uterine and vaginal bleeding, unspecified: N93.9

## 2022-12-02 ENCOUNTER — Telehealth: Payer: Self-pay

## 2022-12-02 ENCOUNTER — Other Ambulatory Visit: Payer: Self-pay | Admitting: Licensed Practical Nurse

## 2022-12-02 ENCOUNTER — Other Ambulatory Visit: Payer: Self-pay

## 2022-12-02 ENCOUNTER — Other Ambulatory Visit: Payer: Self-pay | Admitting: Certified Nurse Midwife

## 2022-12-02 MED ORDER — OXYCODONE-ACETAMINOPHEN 7.5-325 MG PO TABS
1.0000 | ORAL_TABLET | ORAL | 0 refills | Status: DC | PRN
  Filled 2022-12-14: qty 4, 1d supply, fill #0

## 2022-12-02 NOTE — Progress Notes (Signed)
Pt was prescribed percocet to take prior to after she scheduled procedure. Pt states she "lost" the prescription. Percocet 4 tablets reordered  Carie Caddy, CNM  [facility]  12/02/22  3:20 PM

## 2022-12-02 NOTE — Telephone Encounter (Signed)
The patient is calling to get an update? She is ware we are closed tomorrow. Please advise?

## 2022-12-02 NOTE — Telephone Encounter (Signed)
Pt calling; [facility]T has rx'd 4 oxy for her for a procedure she is having Fri - 2 before procedure and 2 after;  pt has lost the rx; can they be rx'd again; is willing to do whatever she needs to to get another rx.

## 2022-12-02 NOTE — Telephone Encounter (Signed)
Left detailed msg that "LMD has send in another order of your medication".

## 2022-12-04 ENCOUNTER — Ambulatory Visit (INDEPENDENT_AMBULATORY_CARE_PROVIDER_SITE_OTHER): Payer: Commercial Managed Care - PPO | Admitting: Certified Nurse Midwife

## 2022-12-04 ENCOUNTER — Encounter: Payer: Self-pay | Admitting: Certified Nurse Midwife

## 2022-12-04 VITALS — BP 153/93 | HR 79 | Wt 359.5 lb

## 2022-12-04 DIAGNOSIS — R9389 Abnormal findings on diagnostic imaging of other specified body structures: Secondary | ICD-10-CM | POA: Diagnosis not present

## 2022-12-04 NOTE — Progress Notes (Signed)
GYN ENCOUNTER NOTE  Subjective:       Nancy Garza is a 42 y.o. G0P0000 female is here for gynecologic evaluation of the following issues:  1. Endometrial biopsy . She u/s 09/24/22  that noted.     Extremely enlarged herterogenous uterus, unable to differentiate fibroids vs endometrial pathology    The Endometrium measures 35.84 mm.   Gynecologic History Patient's last menstrual period was 11/29/2022. Contraception: none Last Pap: 06/29/2022. Results were: normal   Obstetric History OB History  Gravida Para Term Preterm AB Living  0 0 0 0 0 0  SAB IAB Ectopic Multiple Live Births  0 0 0 0 0    Past Medical History:  Diagnosis Date   Abnormal Pap smear of cervix    LEEP procedure   Hypertension     No past surgical history on file.  Current Outpatient Medications on File Prior to Visit  Medication Sig Dispense Refill   amLODipine (NORVASC) 5 MG tablet Take 1 tablet (5 mg total) by mouth daily. 30 tablet 0   citalopram (CELEXA) 20 MG tablet Take 1 tablet (20 mg total) by mouth daily. 90 tablet 0   ferrous sulfate 325 (65 FE) MG tablet Take 1 tablet (325 mg total) by mouth daily with breakfast. 90 tablet 1   hydrochlorothiazide (MICROZIDE) 12.5 MG capsule Take 1 capsule (12.5 mg total) by mouth daily. 30 capsule 0   metoprolol succinate (TOPROL-XL) 100 MG 24 hr tablet Take 1 tablet (100 mg total) by mouth daily. Take with or immediately following a meal. 90 tablet 1   oxyCODONE-acetaminophen (PERCOCET) 7.5-325 MG tablet Take 1-2 tablets by mouth prior to procedure and take 1-2 tablets 4 hours post procedure as needed for pain 4 tablet 0   valsartan (DIOVAN) 160 MG tablet Take 1 tablet (160 mg total) by mouth daily. 90 tablet 0   Vitamin D, Ergocalciferol, (DRISDOL) 1.25 MG (50000 UNIT) CAPS capsule Take 1 capsule (50,000 Units total) by mouth every 7 (seven) days. 24 capsule 0   medroxyPROGESTERone (PROVERA) 10 MG tablet Take 1 tablet (10 mg total) by mouth in the morning,  at noon, and at bedtime for 7 days. 21 tablet 0   No current facility-administered medications on file prior to visit.    No Known Allergies  Social History   Socioeconomic History   Marital status: Single    Spouse name: Not on file   Number of children: Not on file   Years of education: Not on file   Highest education level: Not on file  Occupational History   Not on file  Tobacco Use   Smoking status: Former    Types: Cigarettes   Smokeless tobacco: Never   Tobacco comments:    hemp CBD  Vaping Use   Vaping Use: Never used  Substance and Sexual Activity   Alcohol use: Yes    Alcohol/week: 3.0 standard drinks of alcohol    Types: 3 Shots of liquor per week    Comment: on the weekends   Drug use: Yes    Types: Marijuana   Sexual activity: Not Currently    Birth control/protection: None  Other Topics Concern   Not on file  Social History Narrative   Not on file   Social Determinants of Health   Financial Resource Strain: Not on file  Food Insecurity: Not on file  Transportation Needs: Not on file  Physical Activity: Not on file  Stress: Not on file  Social Connections: Not on  file  Intimate Partner Violence: Not on file    Family History  Problem Relation Age of Onset   Diabetes Mother    Hypertension Mother    Stroke Mother    COPD Mother    Hypertension Father    Diabetes Maternal Grandmother    Hypertension Maternal Grandmother    Stroke Maternal Grandmother    Diabetes Paternal Grandmother    Hypertension Paternal Grandmother    Stroke Paternal Grandmother     The following portions of the patient's history were reviewed and updated as appropriate: allergies, current medications, past family history, past medical history, past social history, past surgical history and problem list.  Review of Systems Review of Systems - Negative except as mentioned above  Review of Systems - General ROS: negative for - chills, fatigue, fever, hot flashes,  malaise or night sweats Hematological and Lymphatic ROS: negative for - bleeding problems or swollen lymph nodes Gastrointestinal ROS: negative for - abdominal pain, blood in stools, change in bowel habits and nausea/vomiting Musculoskeletal ROS: negative for - joint pain, muscle pain or muscular weakness Genito-Urinary ROS: negative for - change in menstrual cycle, dysmenorrhea, dyspareunia, dysuria, genital discharge, genital ulcers, hematuria, incontinence, irregular nocturia or pelvic pain. Positive for heavy menses   Objective:   BP (!) 153/93   Pulse 79   Wt (!) 359 lb 8 oz (163.1 kg)   LMP 11/29/2022   BMI 58.02 kg/m  CONSTITUTIONAL: Well-developed, well-nourished female in no acute distress.  HENT:  Normocephalic, atraumatic.  NECK: Normal range of motion, supple, no masses.  Normal thyroid.  SKIN: Skin is warm and dry. No rash noted. Not diaphoretic. No erythema. No pallor. NEUROLGIC: Alert and oriented to person, place, and time. PSYCHIATRIC: Normal mood and affect. Normal behavior. Normal judgment and thought content. CARDIOVASCULAR:Not Examined RESPIRATORY: Not Examined BREASTS: Not Examined ABDOMEN: Soft, non distended; Non tender.  No Organomegaly. PELVIC:  External Genitalia: Normal  BUS: Normal  Vagina: Normal, thin red blood noted  Cervix: Normal, unable to visualize , cervix very anterior . Dr. Valentino Saxon called to bed side for assistance. She was also unable to reach the cervix as it is above the pubic bone.   Uterus:enlarged, difficult to assess due to body habitus.        Assessment:    Endometrial thickening on ultrasound      Plan:   Failued endometrial biopsy. Discussed option of attempting again in the office at another time vs doing out patient procedure with sedation. Dr. Valentino Saxon also discussed the ability to take pictures and /or complete a D&C to help with managing bleeding. PT request to do out patient procedure. She will make a pre operative  appointment with Dr. Valentino Saxon.   Doreene Burke, CNM

## 2022-12-11 ENCOUNTER — Other Ambulatory Visit: Payer: Self-pay

## 2022-12-11 ENCOUNTER — Ambulatory Visit (INDEPENDENT_AMBULATORY_CARE_PROVIDER_SITE_OTHER): Payer: Commercial Managed Care - PPO | Admitting: Family Medicine

## 2022-12-11 ENCOUNTER — Encounter: Payer: Self-pay | Admitting: Family Medicine

## 2022-12-11 ENCOUNTER — Other Ambulatory Visit: Payer: Self-pay | Admitting: Physician Assistant

## 2022-12-11 VITALS — BP 180/100 | HR 65 | Temp 98.6°F | Wt 362.4 lb

## 2022-12-11 DIAGNOSIS — I10 Essential (primary) hypertension: Secondary | ICD-10-CM

## 2022-12-11 DIAGNOSIS — M5441 Lumbago with sciatica, right side: Secondary | ICD-10-CM

## 2022-12-11 MED ORDER — AMLODIPINE BESYLATE 10 MG PO TABS
10.0000 mg | ORAL_TABLET | Freq: Every day | ORAL | 1 refills | Status: DC
Start: 2022-12-11 — End: 2023-04-01
  Filled 2022-12-11 (×2): qty 90, 90d supply, fill #0

## 2022-12-11 MED ORDER — AMLODIPINE BESYLATE 5 MG PO TABS
5.0000 mg | ORAL_TABLET | Freq: Every day | ORAL | 0 refills | Status: DC
Start: 2022-12-11 — End: 2022-12-11
  Filled 2022-12-11: qty 30, 30d supply, fill #0

## 2022-12-11 MED ORDER — COMFORT TOUCH BP CUFF/LARGE MISC
1.0000 | Freq: Every day | 0 refills | Status: DC
Start: 2022-12-11 — End: 2022-12-28
  Filled 2022-12-11: qty 1, fill #0

## 2022-12-11 MED ORDER — HYDROCHLOROTHIAZIDE 12.5 MG PO CAPS
12.5000 mg | ORAL_CAPSULE | Freq: Every day | ORAL | 0 refills | Status: DC
Start: 2022-12-11 — End: 2023-01-20
  Filled 2022-12-11: qty 30, 30d supply, fill #0

## 2022-12-11 NOTE — Assessment & Plan Note (Addendum)
Chronic, uncontrolled. BP recheck 180/100. CMP and microalbumin done today. Increase Amlodipine to 10 mg daily. Continue Valsartan 160 mg, hydrochlorothiazide 12.5 mg, and metoprolol 100 mg daily. Encouraged to check BP at home more frequently and bring readings in at next visit. Prescription sent for BP cuff, educated that insurance may or may not cover. Provided information on DASH diet. If no improvement in 3 weeks will consider increasing Valsartan/hydrochlorothiazide dose.

## 2022-12-11 NOTE — Assessment & Plan Note (Addendum)
Acute, ongoing. Contine taking Tylenol 1000 mg TID PRN, can add Ibuprofen alternately PRN. Apply heat to area. Provided back exercises. If no improvement in 3 weeks will consider imaging or referral to ortho.

## 2022-12-11 NOTE — Patient Instructions (Addendum)
Try ibuprofen and tylenol together Apply heat to area

## 2022-12-11 NOTE — Telephone Encounter (Signed)
Requested Prescriptions  Pending Prescriptions Disp Refills   amLODipine (NORVASC) 5 MG tablet 30 tablet 0    Sig: Take 1 tablet (5 mg total) by mouth daily.     Cardiovascular: Calcium Channel Blockers 2 Failed - 12/11/2022  8:56 AM      Failed - Last BP in normal range    BP Readings from Last 1 Encounters:  12/04/22 (!) 153/93         Passed - Last Heart Rate in normal range    Pulse Readings from Last 1 Encounters:  12/04/22 79         Passed - Valid encounter within last 6 months    Recent Outpatient Visits           1 month ago Benign hypertension   [facility] [name], Oswaldo Conroy, PA-C   3 months ago Other fatigue   [place] [facility] [name], NP   4 months ago Benign hypertension   [facility] [facility] [name], NP   5 months ago Annual physical exam   [place] [facility] [name], NP   2 years ago Anxiety and depression   [place] [facility] Valentino Nose, NP       Future Appointments             Today Weber Cooks, NP [place] [facility]             hydrochlorothiazide (MICROZIDE) 12.5 MG capsule 30 capsule 0    Sig: Take 1 capsule (12.5 mg total) by mouth daily.     Cardiovascular: Diuretics - Thiazide Failed - 12/11/2022  8:56 AM      Failed - Last BP in normal range    BP Readings from Last 1 Encounters:  12/04/22 (!) 153/93         Passed - Cr in normal range and within 180 days    Creatinine, Ser  Date Value Ref Range Status  06/29/2022 0.77 0.57 - 1.00 mg/dL Final         Passed - K in normal range and within 180 days    Potassium  Date Value Ref Range Status  06/29/2022 4.4 3.5 - 5.2 mmol/L Final         Passed - Na in normal range and within 180 days    Sodium  Date Value Ref Range Status  06/29/2022 140 134 - 144 mmol/L Final         Passed - Valid encounter within last 6  months    Recent Outpatient Visits           1 month ago Benign hypertension   [place] [facility] [name], Oswaldo Conroy, PA-C   3 months ago Other fatigue   [facility] [name], NP   4 months ago Benign hypertension   [place] [facility] [name], NP   5 months ago Annual physical exam   [place] [name], NP   2 years ago Anxiety and depression   [place] [facility] Valentino Nose, NP       Future Appointments             Today Pearley, Sherran Needs, NP [place] [facility]

## 2022-12-11 NOTE — Progress Notes (Signed)
BP (!) 180/100 (BP Location: Left Arm)   Pulse 65   Temp 98.6 F (37 C) (Oral)   Wt (!) 362 lb 6.4 oz (164.4 kg)   LMP 11/29/2022   SpO2 97%   BMI 58.49 kg/m    Subjective:    Patient ID: Nancy Garza, female    DOB: 1980-09-21, 42 y.o.   MRN: [id number]  HPI: Nancy Garza is a 42 y.o. female  Chief Complaint  Patient presents with   Hypertension   Hip Pain    Pt states she has a burning feeling in her leg that's been going on for almost a month now    HYPERTENSION without Chronic Kidney Disease She is taking Amlodipine 5 mg, hydrochlorothiazide 12.5 mg, Metoprolol 100 mg, and Valsartan 160 mg. She checked her BP at home once and was 158/90. Her diet consist of foods with high amounts of sodium.  Hypertension status: uncontrolled  Satisfied with current treatment? yes Duration of hypertension: chronic BP monitoring frequency:  rarely BP medication side effects:  no Medication compliance: excellent compliance Aspirin: no Recurrent headaches: no Visual changes: no Palpitations: no Dyspnea: no Chest pain: no Lower extremity edema: no Dizzy/lightheaded: no   BACK PAIN Duration: 3 weeks Mechanism of injury: unknown came about in the middle of taking a shower Location: right lower back radiating to lateral anterior thigh Onset: sudden  Severity: 8/10  Quality:  Numbness and burning of anterior thigh Frequency: constant Radiation: yes down to right knee.  Aggravating factors: prolonged sitting  and weight bearing on right leg Alleviating factors:  walking   Status: stable Treatments attempted:  Tylenol 500 mg Extra strength every 6 hours and pain never goes away    Relief with NSAIDs?: Has not tried any Weakness with weight bearing: no Weakness with walking: no Paresthesias / decreased sensation: Numbness Swelling: no Redness:no Fevers: no  Relevant past medical, surgical, family and social history reviewed and updated as indicated. Interim medical  history since our last visit reviewed. Allergies and medications reviewed and updated.  Review of Systems  Eyes:  Negative for visual disturbance.  Respiratory: Negative.  Negative for shortness of breath.   Cardiovascular: Negative.  Negative for chest pain, palpitations and leg swelling.  Musculoskeletal:  Positive for back pain.  Neurological:  Negative for dizziness, light-headedness and headaches.    Per HPI unless specifically indicated above     Objective:    BP (!) 180/100 (BP Location: Left Arm)   Pulse 65   Temp 98.6 F (37 C) (Oral)   Wt (!) 362 lb 6.4 oz (164.4 kg)   LMP 11/29/2022   SpO2 97%   BMI 58.49 kg/m   Wt Readings from Last 3 Encounters:  12/11/22 (!) 362 lb 6.4 oz (164.4 kg)  12/04/22 (!) 359 lb 8 oz (163.1 kg)  11/03/22 (!) 357 lb (161.9 kg)    Physical Exam Vitals and nursing note reviewed.  Constitutional:      General: She is awake. She is not in acute distress.    Appearance: Normal appearance. She is well-developed and well-groomed. She is obese. She is not ill-appearing.  HENT:     Head: Normocephalic and atraumatic.     Right Ear: Hearing and external ear normal. No drainage.     Left Ear: Hearing and external ear normal. No drainage.     Nose: Nose normal.  Eyes:     General: Lids are normal.        Right eye:  No discharge.        Left eye: No discharge.     Conjunctiva/sclera: Conjunctivae normal.  Cardiovascular:     Rate and Rhythm: Normal rate and regular rhythm.     Pulses:          Radial pulses are 2+ on the right side and 2+ on the left side.       Posterior tibial pulses are 2+ on the right side and 2+ on the left side.     Heart sounds: Normal heart sounds, S1 normal and S2 normal. No murmur heard.    No gallop.  Pulmonary:     Effort: Pulmonary effort is normal. No accessory muscle usage or respiratory distress.     Breath sounds: Normal breath sounds.  Musculoskeletal:        General: Normal range of motion.      Cervical back: Full passive range of motion without pain and normal range of motion.     Lumbar back: Tenderness present.     Right lower leg: No edema.     Left lower leg: No edema.  Skin:    General: Skin is warm and dry.     Capillary Refill: Capillary refill takes less than 2 seconds.  Neurological:     Mental Status: She is alert and oriented to person, place, and time.  Psychiatric:        Attention and Perception: Attention normal.        Mood and Affect: Mood normal.        Speech: Speech normal.        Behavior: Behavior normal. Behavior is cooperative.        Thought Content: Thought content normal.     Results for orders placed or performed during the hospital encounter of 10/30/22  CBC  Result Value Ref Range   WBC 7.4 4.0 - 10.5 K/uL   RBC 4.03 3.87 - 5.11 MIL/uL   Hemoglobin 11.2 (L) 12.0 - 15.0 g/dL   HCT 40.9 (L) 81.1 - 91.4 %   MCV 87.3 80.0 - 100.0 fL   MCH 27.8 26.0 - 34.0 pg   MCHC 31.8 30.0 - 36.0 g/dL   RDW 78.2 (H) 95.6 - 21.3 %   Platelets 338 150 - 400 K/uL   nRBC 0.3 (H) 0.0 - 0.2 %  Type and screen [facility]  Result Value Ref Range   ABO/RH(D) A POS    Antibody Screen NEG    Sample Expiration      11/02/2022,2359 Performed at [facility], [address], [place], [place] [address]       Assessment & Plan:   Problem List Items Addressed This Visit     Benign hypertension - Primary    Chronic, uncontrolled. BP recheck 180/100. CMP and microalbumin done today. Increase Amlodipine to 10 mg daily. Continue Valsartan 160 mg, hydrochlorothiazide 12.5 mg, and metoprolol 100 mg daily. Encouraged to check BP at home more frequently and bring readings in at next visit. Prescription sent for BP cuff, educated that insurance may or may not cover. Provided information on DASH diet. If no improvement in 3 weeks will consider increasing Valsartan/hydrochlorothiazide dose.       Relevant Medications   Blood Pressure  Monitoring (COMFORT TOUCH BP CUFF/LARGE) MISC   amLODipine (NORVASC) 10 MG tablet   Other Relevant Orders   Comp Met (CMET)   Microalbumin, Urine Waived   Acute right-sided low back pain with right-sided sciatica  Acute, ongoing. Contine taking Tylenol 1000 mg TID PRN, can add Ibuprofen alternately PRN. Apply heat to area. Provided back exercises. If no improvement in 3 weeks will consider imaging or referral to ortho.         Follow up plan: Return in about 3 weeks (around 01/01/2023) for BP Recheck, back pain imaging.

## 2022-12-12 LAB — COMPREHENSIVE METABOLIC PANEL
ALT: 15 IU/L (ref 0–32)
AST: 17 IU/L (ref 0–40)
Albumin: 4.3 g/dL (ref 3.9–4.9)
Alkaline Phosphatase: 50 IU/L (ref 44–121)
BUN/Creatinine Ratio: 24 — ABNORMAL HIGH (ref 9–23)
BUN: 17 mg/dL (ref 6–24)
Bilirubin Total: <0.2 mg/dL (ref 0.0–1.2)
CO2: 26 mmol/L (ref 20–29)
Calcium: 9.5 mg/dL (ref 8.7–10.2)
Chloride: 102 mmol/L (ref 96–106)
Creatinine, Ser: 0.7 mg/dL (ref 0.57–1.00)
Globulin, Total: 3.2 g/dL (ref 1.5–4.5)
Glucose: 107 mg/dL — ABNORMAL HIGH (ref 70–99)
Potassium: 4.4 mmol/L (ref 3.5–5.2)
Sodium: 141 mmol/L (ref 134–144)
Total Protein: 7.5 g/dL (ref 6.0–8.5)
eGFR: 112 mL/min/{1.73_m2} (ref >59)

## 2022-12-12 LAB — MICROALBUMIN, URINE WAIVED
Creatinine, Urine Waived: 200 mg/dL (ref 10–300)
Microalb, Ur Waived: 150 mg/L — ABNORMAL HIGH (ref 0–19)

## 2022-12-14 ENCOUNTER — Other Ambulatory Visit: Payer: Self-pay

## 2022-12-15 NOTE — Progress Notes (Signed)
Hi Nancy Garza, your kidney function levels came back normal, your microalbumin labs were elevated, this is likely associated with your uncontrolled hypertension. Please ensure you are checking your blood pressures at home, taking your medications as prescribed, and follow up with [place] as scheduled to determine if any medications need to be adjusted. Thank you for allowing me to participate in your care!

## 2022-12-16 ENCOUNTER — Ambulatory Visit: Payer: Self-pay | Admitting: *Deleted

## 2022-12-16 DIAGNOSIS — M5441 Lumbago with sciatica, right side: Secondary | ICD-10-CM

## 2022-12-16 NOTE — Telephone Encounter (Signed)
  Chief Complaint: right hip , back pain , requesting x ray  Symptoms: pain worsening in right hip and low back. Numbness in right thigh. Tylenol and ibuprofen not effective. 9-10 pain level. Pain worse with laying sitting standing in one spot. Feels better when walking.  Frequency: 1 month  Pertinent Negatives: Patient denies unable to walk.  Disposition: [] ED /[] Urgent Care (no appt availability in office) / [] Appointment(In office/virtual)/ []  [facility]/ [] Home Care/ [] Refused Recommended Disposition /[] [facility]/ [x]  Follow-up with PCP Additional Notes:   Last OV 12/11/22. Requesting x ray of back / hip. Offered appt today at 3 p. Patient reports she is at work and unable to make appt. Requesting call back regarding if x ray can be done. Patient works at [employer] and can take enough time for xray if ordered. Please advise and call back      Reason for Disposition  [1] SEVERE back pain (e.g., excruciating, unable to do any normal activities) AND [2] not improved 2 hours after pain medicine  Answer Assessment - Initial Assessment Questions 1. ONSET: "When did the pain begin?"      1 month  2. LOCATION: "Where does it hurt?" (upper, mid or lower back)     Back ,right hip  3. SEVERITY: "How bad is the pain?"  (e.g., Scale 1-10; mild, moderate, or severe)   - MILD (1-3): Doesn't interfere with normal activities.    - MODERATE (4-7): Interferes with normal activities or awakens from sleep.    - SEVERE (8-10): Excruciating pain, unable to do any normal activities.      Severe pain , able to sleep . Feels better when walking . Standing one spot laying or sitting makes worse 4. PATTERN: "Is the pain constant?" (e.g., yes, no; constant, intermittent)      Constant when sitting laying or standing in one spot  5. RADIATION: "Does the pain shoot into your legs or somewhere else?"     Right hip to thigh , top of thigh is numb 6. CAUSE:  "What do you think is causing the back  pain?"      Not sure  7. BACK OVERUSE:  "Any recent lifting of heavy objects, strenuous work or exercise?"     Na  8. MEDICINES: "What have you taken so far for the pain?" (e.g., nothing, acetaminophen, NSAIDS)     Tylenol and ibuprofen  9. NEUROLOGIC SYMPTOMS: "Do you have any weakness, numbness, or problems with bowel/bladder control?"     Right thigh numbness  10. OTHER SYMPTOMS: "Do you have any other symptoms?" (e.g., fever, abdomen pain, burning with urination, blood in urine)       Low back pain right hip pain numbness in right thigh  11. PREGNANCY: "Is there any chance you are pregnant?" "When was your last menstrual period?"       na  Protocols used: Back Pain-A-AH

## 2022-12-16 NOTE — Telephone Encounter (Signed)
 Order in.

## 2022-12-16 NOTE — Telephone Encounter (Signed)
Left message for patient to make her aware of Dr Laural Benes placing request order for imaging. Advised patient to give our office a call back if she has any questions regarding voice message.

## 2022-12-17 ENCOUNTER — Ambulatory Visit
Admission: RE | Admit: 2022-12-17 | Discharge: 2022-12-17 | Disposition: A | Discharge status: Home / Self Care | Payer: Commercial Managed Care - PPO | Source: Ambulatory Visit | Attending: Family Medicine | Admitting: Family Medicine

## 2022-12-17 DIAGNOSIS — M5441 Lumbago with sciatica, right side: Secondary | ICD-10-CM

## 2022-12-17 DIAGNOSIS — M47816 Spondylosis without myelopathy or radiculopathy, lumbar region: Secondary | ICD-10-CM | POA: Diagnosis not present

## 2022-12-17 NOTE — Progress Notes (Signed)
Contacted via MyChart   Good morning Debbi, your imaging has returned.  Dr. Laural Benes is on vacation until end of next week and I reviewing her results.  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  You reported pain is worsening.  I would recommend return to office for assessment if worsening pain as you may benefit from some physical therapy.  Any questions?

## 2022-12-22 ENCOUNTER — Encounter: Payer: Self-pay | Admitting: Obstetrics and Gynecology

## 2022-12-22 ENCOUNTER — Ambulatory Visit: Payer: Self-pay

## 2022-12-22 ENCOUNTER — Ambulatory Visit (INDEPENDENT_AMBULATORY_CARE_PROVIDER_SITE_OTHER): Payer: Commercial Managed Care - PPO | Admitting: Obstetrics and Gynecology

## 2022-12-22 VITALS — BP 150/81 | HR 87 | Resp 16 | Ht 67.0 in | Wt 366.1 lb

## 2022-12-22 DIAGNOSIS — Z01818 Encounter for other preprocedural examination: Secondary | ICD-10-CM

## 2022-12-22 DIAGNOSIS — I10 Essential (primary) hypertension: Secondary | ICD-10-CM | POA: Diagnosis not present

## 2022-12-22 DIAGNOSIS — N939 Abnormal uterine and vaginal bleeding, unspecified: Secondary | ICD-10-CM

## 2022-12-22 DIAGNOSIS — R9389 Abnormal findings on diagnostic imaging of other specified body structures: Secondary | ICD-10-CM

## 2022-12-22 NOTE — Telephone Encounter (Signed)
  Chief Complaint: hip pain to the right side  Symptoms: numbness to right thigh Frequency: a month Pertinent Negatives: Patient denies fever,rash  Disposition: [] ED /[] Urgent Care (no appt availability in office) / [] Appointment(In office/virtual)/ []  [facility]/ [] Home Care/ [x] Refused Recommended Disposition /[] [facility]/ []  Follow-up with PCP Additional Notes: pt asking for a hip x ray. Refused appt at this time.  Reason for Disposition  Numbness in a leg or foot (i.e., loss of sensation)  Answer Assessment - Initial Assessment Questions 1. LOCATION and RADIATION: "Where is the pain located?"      Right  2. QUALITY: "What does the pain feel like?"  (e.g., sharp, dull, aching, burning)     burning 3. SEVERITY: "How bad is the pain?" "What does it keep you from doing?"   (Scale 1-10; or mild, moderate, severe)   -  MILD (1-3): doesn't interfere with normal activities    -  MODERATE (4-7): interferes with normal activities (e.g., work or school) or awakens from sleep, limping    -  SEVERE (8-10): excruciating pain, unable to do any normal activities, unable to walk     7 4. ONSET: "When did the pain start?" "Does it come and go, or is it there all the time?"     4 weeks  5. WORK OR EXERCISE: "Has there been any recent work or exercise that involved this part of the body?"      no 6. CAUSE: "What do you think is causing the hip pain?"      unsure 7. AGGRAVATING FACTORS: "What makes the hip pain worse?" (e.g., walking, climbing stairs, running)     Walking sitting  8. OTHER SYMPTOMS: "Do you have any other symptoms?" (e.g., back pain, pain shooting down leg,  fever, rash)     Hip pain numb to thigh  Protocols used: Hip Pain-A-AH

## 2022-12-22 NOTE — Patient Instructions (Signed)
GYNECOLOGY PRE-OPERATIVE INSTRUCTIONS  You are scheduled for surgery on 01/04/2023.  The name of your procedure is: Hysteroscopy D&C.   Please read through these instructions carefully regarding preparation for your surgery: Nothing to eat after midnight on the day prior to surgery.  Do not take any medications unless recommended by your provider on day prior to surgery.  Do not take NSAIDs (Motrin, Aleve) or aspirin 7 days prior to surgery.  You may take Tylenol products for minor aches and pains.  You will receive a prescription for pain medications post-operatively.  You will be contacted by phone approximately 1-2 weeks prior to surgery to schedule your pre-operative appointment.  You will not be able to drive for 24 hours after having surgery. You will need someone to bring you to your surgery and take you home.  Please call the office if you have any questions regarding your upcoming surgery.    Thank you for choosing [facility] at [facility].

## 2022-12-22 NOTE — Telephone Encounter (Signed)
  patient called stated she needs an xray of her right hip as her leg is still numb and she's been taking the pain medication that was prescribed. Does not need to speak with nurse she just wants the xray of her right hip ordered. Please contact patient for f/u.      Left message to call back about symptoms.

## 2022-12-22 NOTE — Progress Notes (Signed)
GYNECOLOGY PREOPERATIVE HISTORY AND PHYSICAL   Subjective:  Nancy Garza is a 42 y.o. G0P0000 here for surgical management of significantly thickened endometrium, mild abnormalities with menstrual cycles.   Unable to perform in office endometrial biopsy due to body habitus and difficulty visualizing her cervix. Significant preoperative concerns: morbid obesity and hypertension (notes medications recently adusted by PCP last week due to hypertensive urgency, systolics in 200s). She had an ultrasound performed 09/24/22  that noted:     Extremely enlarged herterogenous uterus, unable to differentiate fibroids vs endometrial pathology    The Endometrium measures 35.84 mm.   Proposed surgery: Hysteroscopy D&C   Pertinent Gynecological History: Menses: flow is moderate and usually lasting less than 6 days. Does note that cycle will last for 3-4 days, slow down but have significant cramping on Day #5, then have an episode of heavy bleeding on Day #6 which then resolves.  Contraception: none Last mammogram:  Never done.   Last pap: normal Date: 06/29/2022, transformation zone absent.    Past Medical History:  Diagnosis Date   Abnormal Pap smear of cervix    LEEP procedure   Hypertension     History reviewed. No pertinent surgical history.   OB History  Gravida Para Term Preterm AB Living  0 0 0 0 0 0  SAB IAB Ectopic Multiple Live Births  0 0 0 0 0    Family History  Problem Relation Age of Onset   Diabetes Mother    Hypertension Mother    Stroke Mother    COPD Mother    Hypertension Father    Diabetes Maternal Grandmother    Hypertension Maternal Grandmother    Stroke Maternal Grandmother    Diabetes Paternal Grandmother    Hypertension Paternal Grandmother    Stroke Paternal Grandmother     Social History   Socioeconomic History   Marital status: Single    Spouse name: Not on file   Number of children: Not on file   Years of education: Not on file    Highest education level: Not on file  Occupational History   Not on file  Tobacco Use   Smoking status: Former    Types: Cigarettes   Smokeless tobacco: Never   Tobacco comments:    hemp CBD  Vaping Use   Vaping status: Never Used  Substance and Sexual Activity   Alcohol use: Yes    Alcohol/week: 3.0 standard drinks of alcohol    Types: 3 Shots of liquor per week    Comment: on the weekends   Drug use: Yes    Types: Marijuana   Sexual activity: Not Currently    Birth control/protection: None  Other Topics Concern   Not on file  Social History Narrative   Not on file   Social Determinants of Health   Financial Resource Strain: Not on file  Food Insecurity: Not on file  Transportation Needs: Not on file  Physical Activity: Not on file  Stress: Not on file  Social Connections: Not on file  Intimate Partner Violence: Not on file    Current Outpatient Medications on File Prior to Visit  Medication Sig Dispense Refill   amLODipine (NORVASC) 10 MG tablet Take 1 tablet (10 mg total) by mouth daily. 90 tablet 1   Blood Pressure Monitoring (COMFORT TOUCH BP CUFF/LARGE) MISC 1 each by Does not apply route daily. 1 each 0   citalopram (CELEXA) 20 MG tablet Take 1 tablet (20 mg total) by mouth  daily. 90 tablet 0   ferrous sulfate 325 (65 FE) MG tablet Take 1 tablet (325 mg total) by mouth daily with breakfast. 90 tablet 1   hydrochlorothiazide (MICROZIDE) 12.5 MG capsule Take 1 capsule (12.5 mg total) by mouth daily. 30 capsule 0   medroxyPROGESTERone (PROVERA) 10 MG tablet Take 1 tablet (10 mg total) by mouth in the morning, at noon, and at bedtime for 7 days. 21 tablet 0   metoprolol succinate (TOPROL-XL) 100 MG 24 hr tablet Take 1 tablet (100 mg total) by mouth daily. Take with or immediately following a meal. 90 tablet 1   oxyCODONE-acetaminophen (PERCOCET) 7.5-325 MG tablet Take 1-2 tablets by mouth prior to procedure and take 1-2 tablets 4 hours post procedure as needed for pain  (Patient not taking: Reported on 12/11/2022) 4 tablet 0   valsartan (DIOVAN) 160 MG tablet Take 1 tablet (160 mg total) by mouth daily. 90 tablet 0   Vitamin D, Ergocalciferol, (DRISDOL) 1.25 MG (50000 UNIT) CAPS capsule Take 1 capsule (50,000 Units total) by mouth every 7 (seven) days. 24 capsule 0   No current facility-administered medications on file prior to visit.   No Known Allergies   Review of Systems Constitutional: No recent fever/chills/sweats Respiratory: No recent cough/bronchitis Cardiovascular: No chest pain Gastrointestinal: No recent nausea/vomiting/diarrhea Genitourinary: No UTI symptoms Hematologic/lymphatic:No history of coagulopathy or recent blood thinner use    Objective:   Blood pressure (!) 150/81, pulse 87, resp. rate 16, height 5\' 7"  (1.702 m), weight (!) 366 lb 1.6 oz (166.1 kg), last menstrual period 12/07/2022. CONSTITUTIONAL: Well-developed, well-nourished female in no acute distress. Morbidly obese HENT:  Normocephalic, atraumatic, External right and left ear normal. Oropharynx is clear and moist EYES: Conjunctivae and EOM are normal. Pupils are equal, round, and reactive to light. No scleral icterus.  NECK: Normal range of motion, supple, no masses SKIN: Skin is warm and dry. No rash noted. Not diaphoretic. No erythema. No pallor. NEUROLOGIC: Alert and oriented to person, place, and time. Normal reflexes, muscle tone coordination. No cranial nerve deficit noted. PSYCHIATRIC: Normal mood and affect. Normal behavior. Normal judgment and thought content. CARDIOVASCULAR: Normal heart rate noted, regular rhythm RESPIRATORY: Effort and breath sounds normal, no problems with respiration noted ABDOMEN: Soft, nontender, nondistended. PELVIC: Deferred MUSCULOSKELETAL: Normal range of motion. No edema and no tenderness. 2+ distal pulses.    Labs: Results for orders placed or performed in visit on 12/11/22 (from the past 336 hour(s))  Comp Met (CMET)    Collection Time: 12/11/22  4:29 PM  Result Value Ref Range   Glucose 107 (H) 70 - 99 mg/dL   BUN 17 6 - 24 mg/dL   Creatinine, Ser 7.25 0.57 - 1.00 mg/dL   eGFR 366 >44 IH/KVQ/2.59   BUN/Creatinine Ratio 24 (H) 9 - 23   Sodium 141 134 - 144 mmol/L   Potassium 4.4 3.5 - 5.2 mmol/L   Chloride 102 96 - 106 mmol/L   CO2 26 20 - 29 mmol/L   Calcium 9.5 8.7 - 10.2 mg/dL   Total Protein 7.5 6.0 - 8.5 g/dL   Albumin 4.3 3.9 - 4.9 g/dL   Globulin, Total 3.2 1.5 - 4.5 g/dL   Bilirubin Total <5.6 0.0 - 1.2 mg/dL   Alkaline Phosphatase 50 44 - 121 IU/L   AST 17 0 - 40 IU/L   ALT 15 0 - 32 IU/L  Microalbumin, Urine Waived   Collection Time: 12/11/22  4:29 PM  Result Value Ref Range  Microalb, Ur Waived 150 (H) 0 - 19 mg/L   Creatinine, Urine Waived 200 10 - 300 mg/dL   Microalb/Creat Ratio 30-300 (H) <30 mg/g     Imaging Studies: ULTRASOUND REPORT   Location: [facility] at [facility] Date of Service: 09/24/2022      Indications:Abnormal Uterine Bleeding Findings:  The uterus is anteverted and measures 14.4 x 16.95 x 12.41 cm. Echo texture is heterogenous with evidence of focal masses. Extremely enlarged herterogenous uterus, unable to differentiate fibroids vs endometrial pathology    The Endometrium measures 35.84 mm.   Right Ovary not well visualized Left Ovary measures 2.76 x 1.83 x 1.56 cm. It is normal in appearance. Survey of the adnexa demonstrates no adnexal masses. There is no free fluid in the cul de sac.   Impression: 1. Extremely enlarged herterogenous uterus, unable to differentiate fibroids vs endometrial pathology    Recommendations: 1.Clinical correlation with the patient's History and Physical Exam. 2. Follow up with provider for recommendations 3. Endometrial bx likely warranted   Waldo Laine, RT   The ultrasound images and findings were reviewed by me and I agree with the above report.   Elonda Husky, M.D. 10/01/2022 4:08  PM   Assessment:    1. Preoperative exam for gynecologic surgery   2. Endometrial thickening on ultrasound   3. Abnormal uterine bleeding (AUB)   4. Hypertension, unspecified type   5. Morbid obesity (HCC)      Plan:   - Counseling: Procedure, risks, reasons, benefits and complications (including injury to bowel, bladder, uterus, major blood vessel, bleeding, possibility of transfusion, infection, or fistula formation) reviewed in detail. Likelihood of success in alleviating the patient's condition was discussed. Discussed concerns for significantly thickened endometrium even for a premenopausal female, especially if having regular cycles (need to rule out malignancy or hyperplasia).  Routine postoperative instructions will be reviewed with the patient and her family in detail after surgery.  The patient concurred with the proposed plan, giving informed written consent for the surgery.   - Preop testing ordered. - Hypertension better controlled, recently had increase of Norvasc from 5 to 10 mg, and initiated on hydrochlorothiazide.  - Instructions reviewed, including NPO after midnight.     Hildred Laser, MD [facility]

## 2022-12-28 ENCOUNTER — Encounter
Admission: RE | Admit: 2022-12-28 | Discharge: 2022-12-28 | Disposition: A | Discharge status: Home / Self Care | Payer: Commercial Managed Care - PPO | Source: Ambulatory Visit | Attending: Obstetrics and Gynecology | Admitting: Obstetrics and Gynecology

## 2022-12-28 VITALS — Ht 67.0 in | Wt 366.0 lb

## 2022-12-28 DIAGNOSIS — Z8742 Personal history of other diseases of the female genital tract: Secondary | ICD-10-CM | POA: Diagnosis not present

## 2022-12-28 DIAGNOSIS — I10 Essential (primary) hypertension: Secondary | ICD-10-CM | POA: Diagnosis not present

## 2022-12-28 DIAGNOSIS — N939 Abnormal uterine and vaginal bleeding, unspecified: Secondary | ICD-10-CM | POA: Diagnosis not present

## 2022-12-28 DIAGNOSIS — Z01812 Encounter for preprocedural laboratory examination: Secondary | ICD-10-CM

## 2022-12-28 DIAGNOSIS — Z01818 Encounter for other preprocedural examination: Secondary | ICD-10-CM | POA: Diagnosis not present

## 2022-12-28 DIAGNOSIS — Z01810 Encounter for preprocedural cardiovascular examination: Secondary | ICD-10-CM | POA: Diagnosis not present

## 2022-12-28 DIAGNOSIS — R9389 Abnormal findings on diagnostic imaging of other specified body structures: Secondary | ICD-10-CM | POA: Diagnosis not present

## 2022-12-28 HISTORY — DX: Anxiety disorder, unspecified: F41.9

## 2022-12-28 HISTORY — DX: Vitamin D deficiency, unspecified: E55.9

## 2022-12-28 HISTORY — DX: Iron deficiency anemia, unspecified: D50.9

## 2022-12-28 HISTORY — DX: Hyperlipidemia, unspecified: E78.5

## 2022-12-28 HISTORY — DX: Pneumonia, unspecified organism: J18.9

## 2022-12-28 HISTORY — DX: Morbid (severe) obesity due to excess calories: E66.01

## 2022-12-28 HISTORY — DX: Prediabetes: R73.03

## 2022-12-28 HISTORY — DX: Depression, unspecified: F32.A

## 2022-12-28 LAB — CBC
HCT: 36.9 % (ref 36.0–46.0)
Hemoglobin: 11.8 g/dL — ABNORMAL LOW (ref 12.0–15.0)
MCH: 29.1 pg (ref 26.0–34.0)
MCHC: 32 g/dL (ref 30.0–36.0)
MCV: 90.9 fL (ref 80.0–100.0)
Platelets: 270 10*3/uL (ref 150–400)
RBC: 4.06 MIL/uL (ref 3.87–5.11)
RDW: 17.8 % — ABNORMAL HIGH (ref 11.5–15.5)
WBC: 4.6 10*3/uL (ref 4.0–10.5)
nRBC: 0 % (ref 0.0–0.2)

## 2022-12-28 LAB — TYPE AND SCREEN
ABO/RH(D): A POS
Antibody Screen: NEGATIVE

## 2022-12-28 NOTE — Patient Instructions (Addendum)
Your procedure is scheduled on: Monday, August 5 Report to the Registration Desk on the 1st floor of the [facility] To find out your arrival time, please call [phone] between 1PM - 3PM on: Friday, August 2 If your arrival time is 6:00 am, do not arrive before that time as the [facility] entrance doors do not open until 6:00 am.  REMEMBER: Instructions that are not followed completely may result in serious medical risk, up to and including death; or upon the discretion of your surgeon and anesthesiologist your surgery may need to be rescheduled.  Do not eat or drink after midnight the night before surgery.  No gum chewing or hard candies.  One week prior to surgery: starting July 29 Stop Anti-inflammatories (NSAIDS) such as Advil, Aleve, Ibuprofen, Motrin, Naproxen, Naprosyn and Aspirin based products such as Excedrin, Goody's Powder, BC Powder. Stop ANY OVER THE COUNTER supplements until after surgery. You may however, continue to take Tylenol if needed for pain up until the day of surgery.  Continue taking all prescribed medications   TAKE ONLY THESE MEDICATIONS THE MORNING OF SURGERY WITH A SIP OF WATER:  Amlodipine Citalopram (Celexa) Metoprolol  No Alcohol for 24 hours before or after surgery.  No Smoking including e-cigarettes for 24 hours before surgery.  No chewable tobacco products for at least 6 hours before surgery.  No nicotine patches on the day of surgery.  Do not use any "recreational" drugs for at least a week (preferably 2 weeks) before your surgery.  Please be advised that the combination of cocaine and anesthesia may have negative outcomes, up to and including death. If you test positive for cocaine, your surgery will be cancelled.  On the morning of surgery brush your teeth with toothpaste and water, you may rinse your mouth with mouthwash if you wish. Do not swallow any toothpaste or mouthwash.  Do not wear jewelry, make-up, hairpins, clips or nail  polish.  Do not wear lotions, powders, or perfumes.   Do not shave body hair from the neck down 48 hours before surgery.  Contact lenses, hearing aids and dentures may not be worn into surgery.  Do not bring valuables to the hospital. [facility] is not responsible for any missing/lost belongings or valuables.   Notify your doctor if there is any change in your medical condition (cold, fever, infection).  Wear comfortable clothing (specific to your surgery type) to the hospital.  After surgery, you can help prevent lung complications by doing breathing exercises.  Take deep breaths and cough every 1-2 hours.   If you are being discharged the day of surgery, you will not be allowed to drive home. You will need a responsible individual to drive you home and stay with you for 24 hours after surgery.   If you are taking public transportation, you will need to have a responsible individual with you.  Please call the Pre-admissions Testing Dept. at [phone] if you have any questions about these instructions.  Surgery Visitation Policy:  Patients having surgery or a procedure may have two visitors.  Children under the age of 53 must have an adult with them who is not the patient.

## 2023-01-01 ENCOUNTER — Other Ambulatory Visit: Payer: Self-pay

## 2023-01-01 ENCOUNTER — Other Ambulatory Visit: Payer: Self-pay | Admitting: Nurse Practitioner

## 2023-01-01 ENCOUNTER — Ambulatory Visit: Payer: Commercial Managed Care - PPO | Admitting: Family Medicine

## 2023-01-01 NOTE — Telephone Encounter (Signed)
Medication Refill - Medication: Rx #: [id number]  citalopram (CELEXA) 20 MG   valsartan (DIOVAN) 160 MG tablet [[id number]] Rx #: [id number] valsartan (DIOVAN) 160 MG tablet [[id number]]   Pt reports that she has one pill left. And that she will be going into surgery on Monday  Has the patient contacted their pharmacy? Yes.   (Agent: If no, request that the patient contact the pharmacy for the refill. If patient does not wish to contact the pharmacy document the reason why and proceed with request.) (Agent: If yes, when and what did the pharmacy advise?)  Preferred Pharmacy (with phone number or street name):  [facility] Phone: [phone]  Fax: [phone]     Has the patient been seen for an appointment in the last year OR does the patient have an upcoming appointment? Yes.    Agent: Please be advised that RX refills may take up to 3 business days. We ask that you follow-up with your pharmacy.

## 2023-01-03 MED ORDER — CELECOXIB 200 MG PO CAPS
400.0000 mg | ORAL_CAPSULE | ORAL | Status: AC
  Administered 2023-01-04: 400 mg via ORAL

## 2023-01-03 MED ORDER — ACETAMINOPHEN 500 MG PO TABS
1000.0000 mg | ORAL_TABLET | ORAL | Status: AC
  Administered 2023-01-04: 1000 mg via ORAL

## 2023-01-03 MED ORDER — ORAL CARE MOUTH RINSE: 15.0000 mL | Freq: Once | OROMUCOSAL | Status: AC

## 2023-01-03 MED ORDER — LACTATED RINGERS IV SOLN: INTRAVENOUS | Status: DC

## 2023-01-03 MED ORDER — CHLORHEXIDINE GLUCONATE 0.12 % MT SOLN
15.0000 mL | Freq: Once | OROMUCOSAL | Status: AC
  Administered 2023-01-04: 15 mL via OROMUCOSAL

## 2023-01-03 MED ORDER — FAMOTIDINE 20 MG PO TABS
20.0000 mg | ORAL_TABLET | Freq: Once | ORAL | Status: AC
  Administered 2023-01-04: 20 mg via ORAL

## 2023-01-04 ENCOUNTER — Other Ambulatory Visit: Payer: Self-pay

## 2023-01-04 ENCOUNTER — Encounter: Payer: Self-pay | Admitting: Obstetrics and Gynecology

## 2023-01-04 ENCOUNTER — Other Ambulatory Visit: Payer: Self-pay | Admitting: Obstetrics and Gynecology

## 2023-01-04 ENCOUNTER — Ambulatory Visit: Payer: Commercial Managed Care - PPO | Admitting: Urgent Care

## 2023-01-04 ENCOUNTER — Encounter
Admission: RE | Disposition: A | Discharge status: Home / Self Care | Payer: Self-pay | Source: Home / Self Care | Attending: Obstetrics and Gynecology

## 2023-01-04 ENCOUNTER — Ambulatory Visit
Admission: RE | Admit: 2023-01-04 | Discharge: 2023-01-04 | Disposition: A | Discharge status: Home / Self Care | Payer: Commercial Managed Care - PPO | Attending: Obstetrics and Gynecology | Admitting: Obstetrics and Gynecology

## 2023-01-04 DIAGNOSIS — R9389 Abnormal findings on diagnostic imaging of other specified body structures: Secondary | ICD-10-CM

## 2023-01-04 DIAGNOSIS — I10 Essential (primary) hypertension: Secondary | ICD-10-CM | POA: Diagnosis not present

## 2023-01-04 DIAGNOSIS — R7303 Prediabetes: Secondary | ICD-10-CM | POA: Diagnosis not present

## 2023-01-04 DIAGNOSIS — Z87891 Personal history of nicotine dependence: Secondary | ICD-10-CM | POA: Diagnosis not present

## 2023-01-04 DIAGNOSIS — Z8742 Personal history of other diseases of the female genital tract: Secondary | ICD-10-CM

## 2023-01-04 DIAGNOSIS — F1290 Cannabis use, unspecified, uncomplicated: Secondary | ICD-10-CM | POA: Diagnosis not present

## 2023-01-04 DIAGNOSIS — N711 Chronic inflammatory disease of uterus: Secondary | ICD-10-CM | POA: Diagnosis not present

## 2023-01-04 DIAGNOSIS — N926 Irregular menstruation, unspecified: Secondary | ICD-10-CM | POA: Diagnosis not present

## 2023-01-04 DIAGNOSIS — D509 Iron deficiency anemia, unspecified: Secondary | ICD-10-CM

## 2023-01-04 DIAGNOSIS — Z6841 Body Mass Index (BMI) 40.0 and over, adult: Secondary | ICD-10-CM | POA: Diagnosis not present

## 2023-01-04 DIAGNOSIS — N939 Abnormal uterine and vaginal bleeding, unspecified: Secondary | ICD-10-CM | POA: Insufficient documentation

## 2023-01-04 DIAGNOSIS — N879 Dysplasia of cervix uteri, unspecified: Secondary | ICD-10-CM | POA: Diagnosis not present

## 2023-01-04 DIAGNOSIS — Z01812 Encounter for preprocedural laboratory examination: Secondary | ICD-10-CM

## 2023-01-04 DIAGNOSIS — Z01818 Encounter for other preprocedural examination: Secondary | ICD-10-CM

## 2023-01-04 HISTORY — PX: HYSTEROSCOPY WITH D & C: SHX1775

## 2023-01-04 LAB — POCT PREGNANCY, URINE: Preg Test, Ur: NEGATIVE

## 2023-01-04 SURGERY — DILATATION AND CURETTAGE /HYSTEROSCOPY
Anesthesia: General

## 2023-01-04 MED ORDER — ONDANSETRON HCL 4 MG/2ML IJ SOLN: 4.0000 mg | Freq: Once | INTRAMUSCULAR | Status: DC | PRN

## 2023-01-04 MED ORDER — CELECOXIB 200 MG PO CAPS
ORAL_CAPSULE | ORAL | Status: AC
  Filled 2023-01-04: qty 2

## 2023-01-04 MED ORDER — OXYCODONE HCL 5 MG PO TABS
5.0000 mg | ORAL_TABLET | Freq: Once | ORAL | Status: AC | PRN
  Administered 2023-01-04: 5 mg via ORAL

## 2023-01-04 MED ORDER — ACETAMINOPHEN 10 MG/ML IV SOLN: 1000.0000 mg | Freq: Once | INTRAVENOUS | Status: DC | PRN

## 2023-01-04 MED ORDER — PROPOFOL 10 MG/ML IV BOLUS
INTRAVENOUS | Status: AC
  Filled 2023-01-04: qty 40

## 2023-01-04 MED ORDER — PROPOFOL 10 MG/ML IV BOLUS
INTRAVENOUS | Status: DC | PRN
Start: 2023-01-04 — End: 2023-01-04
  Administered 2023-01-04: 50 mg via INTRAVENOUS
  Administered 2023-01-04: 150 mg via INTRAVENOUS
  Administered 2023-01-04: 50 mg via INTRAVENOUS

## 2023-01-04 MED ORDER — CITALOPRAM HYDROBROMIDE 20 MG PO TABS
20.0000 mg | ORAL_TABLET | Freq: Every day | ORAL | 0 refills | Status: DC
  Filled 2023-01-04: qty 90, 90d supply, fill #0

## 2023-01-04 MED ORDER — ACETAMINOPHEN 500 MG PO TABS
ORAL_TABLET | ORAL | Status: AC
  Filled 2023-01-04: qty 2

## 2023-01-04 MED ORDER — LIDOCAINE HCL (PF) 2 % IJ SOLN
INTRAMUSCULAR | Status: AC
  Filled 2023-01-04: qty 5

## 2023-01-04 MED ORDER — KETOROLAC TROMETHAMINE 30 MG/ML IJ SOLN
INTRAMUSCULAR | Status: DC | PRN
  Administered 2023-01-04: 30 mg via INTRAVENOUS

## 2023-01-04 MED ORDER — LIDOCAINE HCL (CARDIAC) PF 100 MG/5ML IV SOSY
PREFILLED_SYRINGE | INTRAVENOUS | Status: DC | PRN
  Administered 2023-01-04: 100 mg via INTRAVENOUS

## 2023-01-04 MED ORDER — KETAMINE HCL 50 MG/5ML IJ SOSY
PREFILLED_SYRINGE | INTRAMUSCULAR | Status: AC
  Filled 2023-01-04: qty 5

## 2023-01-04 MED ORDER — IBUPROFEN 600 MG PO TABS
600.0000 mg | ORAL_TABLET | Freq: Four times a day (QID) | ORAL | 0 refills | Status: DC | PRN
  Filled 2023-01-04: qty 20, 5d supply, fill #0

## 2023-01-04 MED ORDER — OXYCODONE HCL 5 MG/5ML PO SOLN: 5.0000 mg | Freq: Once | ORAL | Status: AC | PRN

## 2023-01-04 MED ORDER — SODIUM CHLORIDE 0.9 % IR SOLN
Status: DC | PRN
Start: 2023-01-04 — End: 2023-01-04
  Administered 2023-01-04: 1000 mL

## 2023-01-04 MED ORDER — DEXAMETHASONE SODIUM PHOSPHATE 10 MG/ML IJ SOLN
INTRAMUSCULAR | Status: AC
  Filled 2023-01-04: qty 1

## 2023-01-04 MED ORDER — ONDANSETRON HCL 4 MG/2ML IJ SOLN
INTRAMUSCULAR | Status: DC | PRN
  Administered 2023-01-04: 4 mg via INTRAVENOUS

## 2023-01-04 MED ORDER — 0.9 % SODIUM CHLORIDE (POUR BTL) OPTIME
TOPICAL | Status: DC | PRN
  Administered 2023-01-04: 500 mL

## 2023-01-04 MED ORDER — FENTANYL CITRATE (PF) 100 MCG/2ML IJ SOLN
INTRAMUSCULAR | Status: AC
  Filled 2023-01-04: qty 2

## 2023-01-04 MED ORDER — FENTANYL CITRATE (PF) 100 MCG/2ML IJ SOLN: 25.0000 ug | INTRAMUSCULAR | Status: DC | PRN

## 2023-01-04 MED ORDER — DEXAMETHASONE SODIUM PHOSPHATE 10 MG/ML IJ SOLN
INTRAMUSCULAR | Status: DC | PRN
  Administered 2023-01-04: 5 mg via INTRAVENOUS

## 2023-01-04 MED ORDER — LACTATED RINGERS IV SOLN: INTRAVENOUS | Status: DC

## 2023-01-04 MED ORDER — KETOROLAC TROMETHAMINE 30 MG/ML IJ SOLN
INTRAMUSCULAR | Status: AC
  Filled 2023-01-04: qty 1

## 2023-01-04 MED ORDER — ONDANSETRON HCL 4 MG/2ML IJ SOLN
INTRAMUSCULAR | Status: AC
  Filled 2023-01-04: qty 2

## 2023-01-04 MED ORDER — FAMOTIDINE 20 MG PO TABS
ORAL_TABLET | ORAL | Status: AC
  Filled 2023-01-04: qty 1

## 2023-01-04 MED ORDER — KETAMINE HCL 10 MG/ML IJ SOLN
INTRAMUSCULAR | Status: DC | PRN
  Administered 2023-01-04: 20 mg via INTRAVENOUS

## 2023-01-04 MED ORDER — VALSARTAN 160 MG PO TABS
160.0000 mg | ORAL_TABLET | Freq: Every day | ORAL | 0 refills | Status: DC
  Filled 2023-01-04: qty 90, 90d supply, fill #0

## 2023-01-04 MED ORDER — FENTANYL CITRATE (PF) 100 MCG/2ML IJ SOLN
INTRAMUSCULAR | Status: DC | PRN
  Administered 2023-01-04 (×2): 25 ug via INTRAVENOUS
  Administered 2023-01-04: 50 ug via INTRAVENOUS

## 2023-01-04 MED ORDER — MIDAZOLAM HCL 2 MG/2ML IJ SOLN
INTRAMUSCULAR | Status: AC
  Filled 2023-01-04: qty 2

## 2023-01-04 MED ORDER — OXYCODONE HCL 5 MG PO TABS
ORAL_TABLET | ORAL | Status: AC
  Filled 2023-01-04: qty 1

## 2023-01-04 MED ORDER — MIDAZOLAM HCL 2 MG/2ML IJ SOLN
INTRAMUSCULAR | Status: DC | PRN
  Administered 2023-01-04: 2 mg via INTRAVENOUS

## 2023-01-04 MED ORDER — CHLORHEXIDINE GLUCONATE 0.12 % MT SOLN
OROMUCOSAL | Status: AC
  Filled 2023-01-04: qty 15

## 2023-01-04 SURGICAL SUPPLY — 25 items
DEVICE MYOSURE LITE (MISCELLANEOUS) IMPLANT
DRSG TELFA 3X8 NADH STRL (GAUZE/BANDAGES/DRESSINGS) IMPLANT
ELECT REM PT RETURN 9FT ADLT (ELECTROSURGICAL)
ELECTRODE REM PT RTRN 9FT ADLT (ELECTROSURGICAL) ×1 IMPLANT
GLOVE BIO SURGEON STRL SZ 6.5 (GLOVE) ×1 IMPLANT
GOWN STRL REUS W/ TWL LRG LVL3 (GOWN DISPOSABLE) ×2 IMPLANT
GOWN STRL REUS W/TWL LRG LVL3 (GOWN DISPOSABLE) ×2
HANDPIECE ABLA MINERVA ENDO (MISCELLANEOUS) IMPLANT
IV NS 1000ML (IV SOLUTION) ×1
IV NS 1000ML BAXH (IV SOLUTION) IMPLANT
IV NS IRRIG 3000ML ARTHROMATIC (IV SOLUTION) ×1 IMPLANT
KIT PROCEDURE FLUENT (KITS) IMPLANT
KIT TURNOVER CYSTO (KITS) ×1 IMPLANT
MANIFOLD NEPTUNE II (INSTRUMENTS) ×1 IMPLANT
NDL HYPO 22X1.5 SAFETY MO (MISCELLANEOUS) IMPLANT
NEEDLE HYPO 22X1.5 SAFETY MO (MISCELLANEOUS) IMPLANT
NS IRRIG 500ML POUR BTL (IV SOLUTION) IMPLANT
PACK DNC HYST (MISCELLANEOUS) ×1 IMPLANT
PAD PREP OB/GYN DISP 24X41 (PERSONAL CARE ITEMS) ×1 IMPLANT
SCRUB CHG 4% DYNA-HEX 4OZ (MISCELLANEOUS) ×1 IMPLANT
SEAL ROD LENS SCOPE MYOSURE (ABLATOR) ×1 IMPLANT
SET CYSTO W/LG BORE CLAMP LF (SET/KITS/TRAYS/PACK) IMPLANT
SYR 10ML LL (SYRINGE) IMPLANT
TRAP FLUID SMOKE EVACUATOR (MISCELLANEOUS) ×1 IMPLANT
WATER STERILE IRR 500ML POUR (IV SOLUTION) ×1 IMPLANT

## 2023-01-04 NOTE — Op Note (Signed)
Procedure(s): HYSTEROSCOPY DILATATION AND CURETTAGE Procedure Note  Nancy Garza female 42 y.o. 01/04/2023  Indications: The patient is a 42 y.o. G0P0000 female with abnormal uterine bleeding, thickened endometrium, morbid obesity. Difficulty in locating cervix due to body habitus and anteverted uterus displacing cervix.   Pre-operative Diagnosis: Abnormal uterine bleeding, thickened endometrium, morbid obesity.  Post-operative Diagnosis: Same  Surgeon: Hildred Laser, MD  Assistants:  None.   Anesthesia: General endotracheal anesthesia  Findings: The uterus was sounded to 12 cm.  Fallopian tubes and ovaries appeared normal.  Procedure Details: The patient was seen in the Holding Room. The risks, benefits, complications, treatment options, and expected outcomes were discussed with the patient.  The patient concurred with the proposed plan, giving informed consent.  The site of surgery properly noted/marked. The patient was taken to the Operating Room, identified as Nancy Garza and the procedure verified as Procedure(s) (LRB): HYSTEROSCOPY DILATATION AND CURETTAGE (N/A). A Time Out was held and the above information confirmed.  She was then placed under general anesthesia without difficulty. She was placed in the dorsal lithotomy position, and was prepped and draped in a sterile manner.  A straight catheterization was performed. A sterile speculum was inserted into the vagina and the cervix was grasped at the anterior lip using a single-toothed tenaculum.  The uterus was sounded to 12 cm. Cervical dilation was performed. A 5 mm hysteroscope was introduced into the uterus under direct visualization. The cavity was allowed to fill, and then the entire cavity was explored, however due to a significant amount of blood clots, unable to visualize entire cavity. The hysteroscope was removed, and a sharp curette was then passed into the uterus and endometrial sampling was collected for  pathology. The tenaculum was removed and excellent hemostasis was noted. The speculum was removed from the vagina.   All instrument and sponge counts were correct at the end of the procedure x 2.  The patient tolerated the procedure well.  She was awakened and taken to the PACU in stable condition.   Estimated Blood Loss: 5 ml      Drains: straight catheterization prior to procedure with 75 ml of clear urine         Total IV Fluids:  800 ml  Specimens: Endometrial curettings          Implants: None         Complications:  None; patient tolerated the procedure well.         Disposition: PACU - hemodynamically stable.         Condition: stable   Hildred Laser, MD [facility]

## 2023-01-04 NOTE — Anesthesia Postprocedure Evaluation (Signed)
Anesthesia Post Note  Patient: Nancy Garza  Procedure(s) Performed: HYSTEROSCOPY DILATATION AND CURETTAGE  Patient location during evaluation: PACU Anesthesia Type: General Level of consciousness: awake and alert, oriented and patient cooperative Pain management: pain level controlled Vital Signs Assessment: post-procedure vital signs reviewed and stable Respiratory status: spontaneous breathing, nonlabored ventilation and respiratory function stable Cardiovascular status: blood pressure returned to baseline and stable Postop Assessment: adequate PO intake Anesthetic complications: no   No notable events documented.   Last Vitals:  Vitals:   01/04/23 1445 01/04/23 1500  BP: (!) 164/91 138/68  Pulse: 73 65  Resp: 19 19  Temp:  (!) 36.3 C  SpO2: 96% 96%    Last Pain:  Vitals:   01/04/23 1450  TempSrc:   PainSc: 4                  Reed Breech

## 2023-01-04 NOTE — Transfer of Care (Signed)
Immediate Anesthesia Transfer of Care Note  Patient: Nancy Garza  Procedure(s) Performed: HYSTEROSCOPY DILATATION AND CURETTAGE  Patient Location: PACU  Anesthesia Type:General  Level of Consciousness: awake  Airway & Oxygen Therapy: Patient Spontanous Breathing and Patient connected to face mask oxygen  Post-op Assessment: Report given to RN and Post -op Vital signs reviewed and stable  Post vital signs: Reviewed and stable  Last Vitals:  Vitals Value Taken Time  BP 150/67 01/04/23 1430  Temp 36.3 C 01/04/23 1430  Pulse 72 01/04/23 1438  Resp 24 01/04/23 1438  SpO2 98 % 01/04/23 1438  Vitals shown include unfiled device data.  Last Pain:  Vitals:   01/04/23 1430  TempSrc:   PainSc: 0-No pain         Complications: No notable events documented.

## 2023-01-04 NOTE — Anesthesia Procedure Notes (Signed)
Procedure Name: LMA Insertion Date/Time: 01/04/2023 1:33 PM  Performed by: Elisabeth Pigeon, CRNAPre-anesthesia Checklist: Emergency Drugs available, Suction available, Patient being monitored, Timeout performed and Patient identified Patient Re-evaluated:Patient Re-evaluated prior to induction Oxygen Delivery Method: Circle system utilized Preoxygenation: Pre-oxygenation with 100% oxygen Induction Type: IV induction Ventilation: Mask ventilation without difficulty LMA: LMA inserted LMA Size: 3.0 Tube size: 7.0 mm Number of attempts: 1 Airway Equipment and Method: Stylet Placement Confirmation: positive ETCO2 and breath sounds checked- equal and bilateral Tube secured with: Tape Dental Injury: Teeth and Oropharynx as per pre-operative assessment

## 2023-01-04 NOTE — H&P (Signed)
GYNECOLOGY PREOPERATIVE HISTORY AND PHYSICAL   Subjective:  Nancy Garza is a 42 y.o. G0P0000 here for surgical management of significantly thickened endometrium, mild abnormalities with menstrual cycles.   Unable to perform in office endometrial biopsy due to body habitus and difficulty visualizing her cervix. Significant preoperative concerns: morbid obesity and hypertension (notes medications recently adusted by PCP last week due to hypertensive urgency, systolics in 200s). She had an ultrasound performed 09/24/22  that noted:     Extremely enlarged herterogenous uterus, unable to differentiate fibroids vs endometrial pathology    The Endometrium measures 35.84 mm.   Proposed surgery: Hysteroscopy D&C   Pertinent Gynecological History: Menses: flow is moderate and usually lasting less than 6 days. Does note that cycle will last for 3-4 days, slow down but have significant cramping on Day #5, then have an episode of heavy bleeding on Day #6 which then resolves.  Contraception: none Last mammogram:  Never done.   Last pap: normal Date: 06/29/2022, transformation zone absent.    Past Medical History:  Diagnosis Date   Abnormal Pap smear of cervix    LEEP procedure   Abnormal uterine bleeding 11/2022   Anxiety    Depression    Hyperlipidemia    Hypertension    Iron deficiency anemia    Morbid obesity with BMI of 50.0-59.9, adult (HCC)    Pneumonia    Pre-diabetes    Vitamin D deficiency     Past Surgical History:  Procedure Laterality Date   NO PAST SURGERIES       OB History  Gravida Para Term Preterm AB Living  0 0 0 0 0 0  SAB IAB Ectopic Multiple Live Births  0 0 0 0 0    Family History  Problem Relation Age of Onset   Diabetes Mother    Hypertension Mother    Stroke Mother    COPD Mother    Hypertension Father    Diabetes Maternal Grandmother    Hypertension Maternal Grandmother    Stroke Maternal Grandmother    Diabetes Paternal Grandmother     Hypertension Paternal Grandmother    Stroke Paternal Grandmother     Social History   Socioeconomic History   Marital status: Single    Spouse name: Not on file   Number of children: 0   Years of education: Not on file   Highest education level: Not on file  Occupational History   Not on file  Tobacco Use   Smoking status: Former    Types: Cigarettes   Smokeless tobacco: Never   Tobacco comments:    hemp CBD  Vaping Use   Vaping status: Former  Substance and Sexual Activity   Alcohol use: Yes    Alcohol/week: 3.0 standard drinks of alcohol    Types: 3 Shots of liquor per week    Comment: on the weekends   Drug use: Yes    Types: Marijuana    Comment: daily   Sexual activity: Not Currently    Birth control/protection: None  Other Topics Concern   Not on file  Social History Narrative   Not on file   Social Determinants of Health   Financial Resource Strain: Not on file  Food Insecurity: Not on file  Transportation Needs: Not on file  Physical Activity: Not on file  Stress: Not on file  Social Connections: Not on file  Intimate Partner Violence: Not on file    No current facility-administered medications on file prior  to encounter.   Current Outpatient Medications on File Prior to Encounter  Medication Sig Dispense Refill   amLODipine (NORVASC) 10 MG tablet Take 1 tablet (10 mg total) by mouth daily. 90 tablet 1   metoprolol succinate (TOPROL-XL) 100 MG 24 hr tablet Take 1 tablet (100 mg total) by mouth daily. Take with or immediately following a meal. 90 tablet 1   ferrous sulfate 325 (65 FE) MG tablet Take 1 tablet (325 mg total) by mouth daily with breakfast. 90 tablet 1   hydrochlorothiazide (MICROZIDE) 12.5 MG capsule Take 1 capsule (12.5 mg total) by mouth daily. 30 capsule 0   Vitamin D, Ergocalciferol, (DRISDOL) 1.25 MG (50000 UNIT) CAPS capsule Take 1 capsule (50,000 Units total) by mouth every 7 (seven) days. (Patient taking differently: Take 50,000  Units by mouth every 7 (seven) days. Monday) 24 capsule 0   No Known Allergies   Review of Systems Constitutional: No recent fever/chills/sweats Respiratory: No recent cough/bronchitis Cardiovascular: No chest pain Gastrointestinal: No recent nausea/vomiting/diarrhea Genitourinary: No UTI symptoms Hematologic/lymphatic:No history of coagulopathy or recent blood thinner use    Objective:   Blood pressure (!) 179/98, pulse 89, temperature 97.8 F (36.6 C), temperature source Temporal, resp. rate 17, last menstrual period 12/07/2022, SpO2 100%. CONSTITUTIONAL: Well-developed, well-nourished female in no acute distress. Morbidly obese HENT:  Normocephalic, atraumatic, External right and left ear normal. Oropharynx is clear and moist EYES: Conjunctivae and EOM are normal. Pupils are equal, round, and reactive to light. No scleral icterus.  NECK: Normal range of motion, supple, no masses SKIN: Skin is warm and dry. No rash noted. Not diaphoretic. No erythema. No pallor. NEUROLOGIC: Alert and oriented to person, place, and time. Normal reflexes, muscle tone coordination. No cranial nerve deficit noted. PSYCHIATRIC: Normal mood and affect. Normal behavior. Normal judgment and thought content. CARDIOVASCULAR: Normal heart rate noted, regular rhythm RESPIRATORY: Effort and breath sounds normal, no problems with respiration noted ABDOMEN: Soft, nontender, nondistended. PELVIC: Deferred MUSCULOSKELETAL: Normal range of motion. No edema and no tenderness. 2+ distal pulses.    Labs: Results for orders placed or performed during the hospital encounter of 12/28/22 (from the past 336 hour(s))  CBC   Collection Time: 12/28/22 10:12 AM  Result Value Ref Range   WBC 4.6 4.0 - 10.5 K/uL   RBC 4.06 3.87 - 5.11 MIL/uL   Hemoglobin 11.8 (L) 12.0 - 15.0 g/dL   HCT 16.1 09.6 - 04.5 %   MCV 90.9 80.0 - 100.0 fL   MCH 29.1 26.0 - 34.0 pg   MCHC 32.0 30.0 - 36.0 g/dL   RDW 40.9 (H) 81.1 - 91.4 %    Platelets 270 150 - 400 K/uL   nRBC 0.0 0.0 - 0.2 %  Type and screen [facility]   Collection Time: 12/28/22 10:12 AM  Result Value Ref Range   ABO/RH(D) A POS    Antibody Screen NEG    Sample Expiration 01/11/2023,2359    Extend sample reason      NO TRANSFUSIONS OR PREGNANCY IN THE PAST 3 MONTHS Performed at [facility], [address] [place], [place] [address]      Imaging Studies: ULTRASOUND REPORT   Location: [facility] [place] [facility] Date of Service: 09/24/2022      Indications:Abnormal Uterine Bleeding Findings:  The uterus is anteverted and measures 14.4 x 16.95 x 12.41 cm. Echo texture is heterogenous with evidence of focal masses. Extremely enlarged herterogenous uterus, unable to differentiate fibroids vs endometrial pathology  The Endometrium measures 35.84 mm.   Right Ovary not well visualized Left Ovary measures 2.76 x 1.83 x 1.56 cm. It is normal in appearance. Survey of the adnexa demonstrates no adnexal masses. There is no free fluid in the cul de sac.   Impression: 1. Extremely enlarged herterogenous uterus, unable to differentiate fibroids vs endometrial pathology    Recommendations: 1.Clinical correlation with the patient's History and Physical Exam. 2. Follow up with provider for recommendations 3. Endometrial bx likely warranted   Waldo Laine, RT   The ultrasound images and findings were reviewed by me and I agree with the above report.   Elonda Husky, M.D. 10/01/2022 4:08 PM   Assessment:    1. Endometrial thickening on ultrasound   2. Abnormal uterine bleeding (AUB)   3. Preoperative exam for gynecologic surgery   4. Encounter for preprocedural laboratory examination      Plan:   - Counseling: Procedure, risks, reasons, benefits and complications (including injury to bowel, bladder, uterus, major blood vessel, bleeding, possibility of transfusion, infection, or fistula formation) reviewed  in detail. Likelihood of success in alleviating the patient's condition was discussed. Discussed concerns for significantly thickened endometrium even for a premenopausal female, especially if having regular cycles (need to rule out malignancy or hyperplasia).  Routine postoperative instructions will be reviewed with the patient and her family in detail after surgery.  The patient concurred with the proposed plan, giving informed written consent for the surgery.   - Preop testing reviewed. - Hypertension better controlled, recently had increase of Norvasc from 5 to 10 mg, and initiated on hydrochlorothiazide.  - Instructions reviewed, including NPO after midnight.     Hildred Laser, MD [facility]

## 2023-01-04 NOTE — Telephone Encounter (Signed)
Requested Prescriptions  Pending Prescriptions Disp Refills   valsartan (DIOVAN) 160 MG tablet 90 tablet 0    Sig: Take 1 tablet (160 mg total) by mouth daily.     Cardiovascular:  Angiotensin Receptor Blockers Failed - 01/01/2023  3:41 PM      Failed - Last BP in normal range    BP Readings from Last 1 Encounters:  12/22/22 (!) 150/81         Passed - Cr in normal range and within 180 days    Creatinine, Ser  Date Value Ref Range Status  12/11/2022 0.70 0.57 - 1.00 mg/dL Final         Passed - K in normal range and within 180 days    Potassium  Date Value Ref Range Status  12/11/2022 4.4 3.5 - 5.2 mmol/L Final         Passed - Patient is not pregnant      Passed - Valid encounter within last 6 months    Recent Outpatient Visits           3 weeks ago Benign hypertension   [place] [facility] Pearley, Sherran Needs, NP   2 months ago Benign hypertension   [place] [facility] [name], Oswaldo Conroy, PA-C   3 months ago Other fatigue   [place] [facility] [name], NP   4 months ago Benign hypertension   [place] [facility] [name], NP   6 months ago Annual physical exam   [place] [facility] [name], NP               citalopram (CELEXA) 20 MG tablet 90 tablet 0    Sig: Take 1 tablet (20 mg total) by mouth daily.     Psychiatry:  Antidepressants - SSRI Passed - 01/01/2023  3:41 PM      Passed - Completed PHQ-2 or PHQ-9 in the last 360 days      Passed - Valid encounter within last 6 months    Recent Outpatient Visits           3 weeks ago Benign hypertension   [place] [facility] [place], Sherran Needs, NP   2 months ago Benign hypertension   [place] [facility] [name], Oswaldo Conroy, PA-C   3 months ago Other fatigue   [place] [facility] [name], NP   4 months ago Benign hypertension   [facility] [name], [facility]   6 months ago Annual physical exam   [place] [facility] [name], NP

## 2023-01-04 NOTE — Anesthesia Preprocedure Evaluation (Addendum)
Anesthesia Evaluation  Patient identified by MRN, date of birth, ID band Patient awake    Reviewed: Allergy & Precautions, NPO status , Patient's Chart, lab work & pertinent test results  History of Anesthesia Complications Negative for: history of anesthetic complications  Airway Mallampati: IV   Neck ROM: Full    Dental  (+) Missing, Chipped   Pulmonary former smoker   Pulmonary exam normal breath sounds clear to auscultation       Cardiovascular hypertension, Normal cardiovascular exam Rhythm:Regular Rate:Normal  ECG 12/28/22: normal   Neuro/Psych  PSYCHIATRIC DISORDERS Anxiety Depression    Daily marijuana use    GI/Hepatic negative GI ROS,,,  Endo/Other  Prediabetes; class 3 obesity  Renal/GU negative Renal ROS     Musculoskeletal   Abdominal   Peds  Hematology  (+) Blood dyscrasia, anemia   Anesthesia Other Findings   Reproductive/Obstetrics                             Anesthesia Physical Anesthesia Plan  ASA: 3  Anesthesia Plan: General   Post-op Pain Management:    Induction: Intravenous  PONV Risk Score and Plan: 3 and Ondansetron, Dexamethasone and Treatment may vary due to age or medical condition  Airway Management Planned: LMA  Additional Equipment:   Intra-op Plan:   Post-operative Plan: Extubation in OR  Informed Consent: I have reviewed the patients History and Physical, chart, labs and discussed the procedure including the risks, benefits and alternatives for the proposed anesthesia with the patient or authorized representative who has indicated his/her understanding and acceptance.     Dental advisory given  Plan Discussed with: CRNA  Anesthesia Plan Comments: (Patient consented for risks of anesthesia including but not limited to:  - adverse reactions to medications - damage to eyes, teeth, lips or other oral mucosa - nerve damage due to positioning   - sore throat or hoarseness - damage to heart, brain, nerves, lungs, other parts of body or loss of life  Informed patient about role of CRNA in peri- and intra-operative care.  Patient voiced understanding.)        Anesthesia Quick Evaluation

## 2023-01-04 NOTE — Discharge Instructions (Addendum)
AMBULATORY SURGERY  DISCHARGE INSTRUCTIONS   The drugs that you were given will stay in your system until tomorrow so for the next 24 hours you should not:  Drive an automobile Make any legal decisions Drink any alcoholic beverage   You may resume regular meals tomorrow.  Today it is better to start with liquids and gradually work up to solid foods.  You may eat anything you prefer, but it is better to start with liquids, then soup and crackers, and gradually work up to solid foods.   Please notify your doctor immediately if you have any unusual bleeding, trouble breathing, redness and pain at the surgery site, drainage, fever, or pain not relieved by medication.   Additional Instructions: Got a pain pill at 2:50 and got tylenol 1,000 mg at 11:00 am

## 2023-01-05 ENCOUNTER — Encounter: Payer: Self-pay | Admitting: Obstetrics and Gynecology

## 2023-01-06 ENCOUNTER — Telehealth: Payer: Self-pay

## 2023-01-06 NOTE — Telephone Encounter (Signed)
Her biopsy results were negative for anything concerning.  Just a thick endometrium.

## 2023-01-06 NOTE — Telephone Encounter (Signed)
Pt left msg on triage stating she has received her results from [facility] procedure. She would like for someone to call her back to go over results since her post op appointment isn't until 01/20/23.

## 2023-01-07 NOTE — Telephone Encounter (Signed)
Pt aware.

## 2023-01-13 ENCOUNTER — Encounter: Payer: Self-pay | Admitting: Obstetrics and Gynecology

## 2023-01-19 NOTE — Progress Notes (Unsigned)
    OBSTETRICS/GYNECOLOGY POST-OPERATIVE CLINIC VISIT  Subjective:     Nancy Garza is a 42 y.o. female who presents to the clinic 2 weeks status post HYSTEROSCOPY DILATATION AND CURETTAGE  for Thickened endometrium, abnormal uterine bleeding . Eating a regular diet {with-without:5700} difficulty. Bowel movements are {normal/abnormal***:19619}. {pain control:13522::"The patient is not having any pain."}  {Common ambulatory SmartLinks:19316}  Review of Systems {ros; complete:30496}   Objective:   LMP 12/07/2022 Comment: UPT negative per Sam,RN in Pre-op @ 1310 There is no height or weight on file to calculate BMI.  General:  alert and no distress  Abdomen: soft, bowel sounds active, non-tender  Incision:   {incision:13716::"no dehiscence","incision well approximated","healing well","no drainage","no erythema","no hernia","no seroma","no swelling"}    Pathology:    Assessment:   Patient s/p HYSTEROSCOPY DILATATION AND CURETTAGE  (surgery)  {doing well:13525::"Doing well postoperatively."}   Plan:   1. Continue any current medications as instructed by provider. 2. Wound care discussed. 3. Operative findings again reviewed. Pathology report discussed. 4. Activity restrictions: {restrictions:13723} 5. Anticipated return to work: {work return:14002}. 6. Follow up: {4-78:29562} {time; units:18646} for ***    Hildred Laser, MD [place] [facility]

## 2023-01-20 ENCOUNTER — Other Ambulatory Visit: Payer: Self-pay

## 2023-01-20 ENCOUNTER — Other Ambulatory Visit: Payer: Self-pay | Admitting: Physician Assistant

## 2023-01-20 ENCOUNTER — Other Ambulatory Visit: Payer: Self-pay | Admitting: Obstetrics and Gynecology

## 2023-01-20 ENCOUNTER — Ambulatory Visit (INDEPENDENT_AMBULATORY_CARE_PROVIDER_SITE_OTHER): Payer: Commercial Managed Care - PPO | Admitting: Obstetrics and Gynecology

## 2023-01-20 ENCOUNTER — Encounter: Payer: Self-pay | Admitting: Obstetrics and Gynecology

## 2023-01-20 VITALS — BP 144/89 | HR 68 | Resp 16 | Ht 67.0 in | Wt 371.5 lb

## 2023-01-20 DIAGNOSIS — Z4889 Encounter for other specified surgical aftercare: Secondary | ICD-10-CM

## 2023-01-20 DIAGNOSIS — I10 Essential (primary) hypertension: Secondary | ICD-10-CM

## 2023-01-20 DIAGNOSIS — Z9889 Other specified postprocedural states: Secondary | ICD-10-CM

## 2023-01-20 DIAGNOSIS — N920 Excessive and frequent menstruation with regular cycle: Secondary | ICD-10-CM

## 2023-01-20 MED ORDER — NORETHINDRONE ACETATE 5 MG PO TABS
10.0000 mg | ORAL_TABLET | Freq: Every day | ORAL | 6 refills | Status: DC
  Filled 2023-01-20: qty 60, 30d supply, fill #0

## 2023-01-21 ENCOUNTER — Other Ambulatory Visit: Payer: Self-pay

## 2023-01-21 MED ORDER — HYDROCHLOROTHIAZIDE 12.5 MG PO CAPS
12.5000 mg | ORAL_CAPSULE | Freq: Every day | ORAL | 0 refills | Status: DC
  Filled 2023-01-21: qty 90, 90d supply, fill #0

## 2023-01-21 NOTE — Telephone Encounter (Signed)
Requested Prescriptions  Pending Prescriptions Disp Refills   hydrochlorothiazide (MICROZIDE) 12.5 MG capsule 90 capsule 0    Sig: Take 1 capsule (12.5 mg total) by mouth daily.     Cardiovascular: Diuretics - Thiazide Failed - 01/20/2023  9:31 AM      Failed - Last BP in normal range    BP Readings from Last 1 Encounters:  01/20/23 (!) 144/89         Passed - Cr in normal range and within 180 days    Creatinine, Ser  Date Value Ref Range Status  12/11/2022 0.70 0.57 - 1.00 mg/dL Final         Passed - K in normal range and within 180 days    Potassium  Date Value Ref Range Status  12/11/2022 4.4 3.5 - 5.2 mmol/L Final         Passed - Na in normal range and within 180 days    Sodium  Date Value Ref Range Status  12/11/2022 141 134 - 144 mmol/L Final         Passed - Valid encounter within last 6 months    Recent Outpatient Visits           1 month ago Benign hypertension   [place] [facility] Pearley, Sherran Needs, NP   2 months ago Benign hypertension   [place] [facility] [name], Oswaldo Conroy, PA-C   4 months ago Other fatigue   [place] [facility] [name], NP   5 months ago Benign hypertension   [place] [facility] [name], NP   6 months ago Annual physical exam    [name], [facility]       Future Appointments             In 2 months [name], Tawanna Cooler, MD [facility]

## 2023-01-25 ENCOUNTER — Other Ambulatory Visit: Payer: Self-pay

## 2023-01-29 ENCOUNTER — Other Ambulatory Visit: Payer: Self-pay | Admitting: Medical Genetics

## 2023-01-29 DIAGNOSIS — Z006 Encounter for examination for normal comparison and control in clinical research program: Secondary | ICD-10-CM

## 2023-02-12 ENCOUNTER — Other Ambulatory Visit: Payer: Self-pay

## 2023-03-19 ENCOUNTER — Other Ambulatory Visit: Payer: Self-pay | Admitting: Nurse Practitioner

## 2023-03-19 ENCOUNTER — Other Ambulatory Visit: Payer: Self-pay

## 2023-03-19 ENCOUNTER — Other Ambulatory Visit: Payer: Self-pay | Admitting: Physician Assistant

## 2023-03-19 DIAGNOSIS — I10 Essential (primary) hypertension: Secondary | ICD-10-CM

## 2023-03-19 MED FILL — Metoprolol Succinate Tab ER 24HR 100 MG (Tartrate Equiv): ORAL | 90 days supply | Qty: 90 | Fill #0 | Status: AC

## 2023-03-19 MED FILL — Hydrochlorothiazide Cap 12.5 MG: ORAL | 90 days supply | Qty: 90 | Fill #0 | Status: CN

## 2023-03-19 NOTE — Telephone Encounter (Signed)
Requested Prescriptions  Pending Prescriptions Disp Refills   hydrochlorothiazide (MICROZIDE) 12.5 MG capsule 90 capsule 0    Sig: Take 1 capsule (12.5 mg total) by mouth daily.     Cardiovascular: Diuretics - Thiazide Failed - 03/19/2023 11:25 AM      Failed - Last BP in normal range    BP Readings from Last 1 Encounters:  01/20/23 (!) 144/89         Passed - Cr in normal range and within 180 days    Creatinine, Ser  Date Value Ref Range Status  12/11/2022 0.70 0.57 - 1.00 mg/dL Final         Passed - K in normal range and within 180 days    Potassium  Date Value Ref Range Status  12/11/2022 4.4 3.5 - 5.2 mmol/L Final         Passed - Na in normal range and within 180 days    Sodium  Date Value Ref Range Status  12/11/2022 141 134 - 144 mmol/L Final         Passed - Valid encounter within last 6 months    Recent Outpatient Visits           3 months ago Benign hypertension   [place] [facility] [place], Sherran Needs, NP   4 months ago Benign hypertension   [place] [facility] [name], Oswaldo Conroy, PA-C   6 months ago Other fatigue   [facility] [name], NP   7 months ago Benign hypertension   [place] [facility] [name], NP   8 months ago Annual physical exam   [place] [facility] [name], NP       Future Appointments             In 1 week [name], Tawanna Cooler, MD [facility]             metoprolol succinate (TOPROL-XL) 100 MG 24 hr tablet 90 tablet 0    Sig: Take 1 tablet (100 mg total) by mouth daily. Take with or immediately following a meal.     Cardiovascular:  Beta Blockers Failed - 03/19/2023 11:25 AM      Failed - Last BP in normal range    BP Readings from Last 1 Encounters:  01/20/23 (!) 144/89         Passed - Last Heart Rate in normal range    Pulse Readings from Last 1 Encounters:   01/20/23 68         Passed - Valid encounter within last 6 months    Recent Outpatient Visits           3 months ago Benign hypertension   [place] [facility] [place], Sherran Needs, NP   4 months ago Benign hypertension   [name], Oswaldo Conroy, PA-C   6 months ago Other fatigue   [place] [facility] [name], NP   7 months ago Benign hypertension   [place] [facility] [name], NP   8 months ago Annual physical exam    [facility] [name], NP       Future Appointments             In 1 week [name], Tawanna Cooler, MD [facility]

## 2023-03-22 ENCOUNTER — Other Ambulatory Visit: Payer: Self-pay

## 2023-03-22 MED FILL — Hydrochlorothiazide Cap 12.5 MG: ORAL | 30 days supply | Qty: 60 | Fill #0 | Status: AC

## 2023-04-01 ENCOUNTER — Other Ambulatory Visit: Payer: Self-pay | Admitting: Family Medicine

## 2023-04-01 ENCOUNTER — Other Ambulatory Visit: Payer: Self-pay

## 2023-04-01 ENCOUNTER — Ambulatory Visit: Payer: Commercial Managed Care - PPO | Admitting: Family Medicine

## 2023-04-01 DIAGNOSIS — I10 Essential (primary) hypertension: Secondary | ICD-10-CM

## 2023-04-01 MED ORDER — AMLODIPINE BESYLATE 10 MG PO TABS
10.0000 mg | ORAL_TABLET | Freq: Every day | ORAL | 0 refills | Status: DC
  Filled 2023-04-01 – 2023-04-13 (×2): qty 90, 90d supply, fill #0
  Filled 2023-05-03: qty 30, 30d supply, fill #0

## 2023-04-01 NOTE — Progress Notes (Deleted)
New patient visit   Patient: Nancy Garza   DOB: 04-06-1981   41 y.o. Female  MRN: [id number] Visit Date: 04/01/2023  Today's healthcare provider: Ronnald Ramp, MD   No chief complaint on file.  Subjective    Nancy Garza is a 42 y.o. female who presents today as a new patient to establish care.      Discussed the use of AI scribe software for clinical note transcription with the patient, who gave verbal consent to proceed.  History of Present Illness              Past Medical History:  Diagnosis Date   Abnormal Pap smear of cervix    LEEP procedure   Abnormal uterine bleeding 11/2022   Anxiety    Depression    Hyperlipidemia    Hypertension    Iron deficiency anemia    Morbid obesity with BMI of 50.0-59.9, adult (HCC)    Pneumonia    Pre-diabetes    Vitamin D deficiency     Outpatient Medications Prior to Visit  Medication Sig   amLODipine (NORVASC) 10 MG tablet Take 1 tablet (10 mg total) by mouth daily.   citalopram (CELEXA) 20 MG tablet Take 1 tablet (20 mg total) by mouth daily.   ferrous sulfate 325 (65 FE) MG tablet Take 1 tablet (325 mg total) by mouth daily with breakfast.   hydrochlorothiazide (MICROZIDE) 12.5 MG capsule Take 1 capsule (12.5 mg total) by mouth daily.   ibuprofen (ADVIL) 600 MG tablet Take 1 tablet (600 mg total) by mouth every 6 (six) hours as needed for moderate pain or mild pain.   metoprolol succinate (TOPROL-XL) 100 MG 24 hr tablet Take 1 tablet (100 mg total) by mouth daily. Take with or immediately following a meal.   norethindrone (AYGESTIN) 5 MG tablet Take 2 tablets (10 mg total) by mouth daily.   valsartan (DIOVAN) 160 MG tablet Take 1 tablet (160 mg total) by mouth daily.   Vitamin D, Ergocalciferol, (DRISDOL) 1.25 MG (50000 UNIT) CAPS capsule Take 1 capsule (50,000 Units total) by mouth every 7 (seven) days. (Patient taking differently: Take 50,000 Units by mouth every 7 (seven) days. Monday)   No  facility-administered medications prior to visit.    Past Surgical History:  Procedure Laterality Date   HYSTEROSCOPY WITH D & C N/A 01/04/2023   Procedure: HYSTEROSCOPY DILATATION AND CURETTAGE;  Surgeon: Hildred Laser, MD;  Location: [facility] ORS;  Service: Gynecology;  Laterality: N/A;   NO PAST SURGERIES     Family Status  Relation Name Status   Mother  Alive   Father  Alive   MGM  (Not Specified)   PGM  (Not Specified)  No partnership data on file   Family History  Problem Relation Age of Onset   Diabetes Mother    Hypertension Mother    Stroke Mother    COPD Mother    Hypertension Father    Diabetes Maternal Grandmother    Hypertension Maternal Grandmother    Stroke Maternal Grandmother    Diabetes Paternal Grandmother    Hypertension Paternal Grandmother    Stroke Paternal Grandmother    Social History   Socioeconomic History   Marital status: Single    Spouse name: Not on file   Number of children: 0   Years of education: Not on file   Highest education level: Not on file  Occupational History   Not on file  Tobacco Use   Smoking status:  Former    Types: Cigarettes   Smokeless tobacco: Never   Tobacco comments:    hemp CBD  Vaping Use   Vaping status: Former  Substance and Sexual Activity   Alcohol use: Yes    Alcohol/week: 3.0 standard drinks of alcohol    Types: 3 Shots of liquor per week    Comment: on the weekends   Drug use: Yes    Types: Marijuana    Comment: daily   Sexual activity: Not Currently    Birth control/protection: None  Other Topics Concern   Not on file  Social History Narrative   Not on file   Social Determinants of Health   Financial Resource Strain: Not on file  Food Insecurity: Not on file  Transportation Needs: Not on file  Physical Activity: Not on file  Stress: Not on file  Social Connections: Not on file     No Known Allergies  Immunization History  Administered Date(s) Administered   Influenza,inj,Quad PF,6+  Mos 03/03/2019   Influenza-Unspecified 03/03/2019, 02/26/2020, 03/09/2022   PFIZER(Purple Top)SARS-COV-2 Vaccination 10/11/2019, 02/04/2020   Tdap 12/14/2014    Health Maintenance  Topic Date Due   MAMMOGRAM  Never done   INFLUENZA VACCINE  12/31/2022   COVID-19 Vaccine (3 - 2023-24 season) 01/31/2023   DTaP/Tdap/Td (2 - Td or Tdap) 12/13/2024   Cervical Cancer Screening (HPV/Pap Cotest)  06/29/2025   Hepatitis C Screening  Completed   HIV Screening  Completed   HPV VACCINES  Aged Out    Patient Care Team: [name], NP as PCP - General (Nurse Practitioner) Gustavus Bryant, LCSW as [facility] Care Management (Licensed Clinical Social Worker)  Review of Systems  {Insert previous labs (optional):23779} {See past labs  Heme  Chem  Endocrine  Serology  Results Review (optional):1}   Objective    There were no vitals taken for this visit. {Insert last BP/Wt (optional):23777}{See vitals history (optional):1}    Depression Screen    12/11/2022    4:12 PM 11/03/2022   11:38 AM 09/07/2022    3:50 PM 08/13/2022    3:23 PM  PHQ 2/9 Scores  PHQ - 2 Score 2 4 3 4   PHQ- 9 Score 16 4 14 12    No results found for any visits on 04/01/23.   Physical Exam ***    Assessment & Plan      Problem List Items Addressed This Visit   None   Assessment and Plan               No follow-ups on file.      Ronnald Ramp, MD  [facility] [phone] (phone) [phone] (fax)  [facility]

## 2023-04-01 NOTE — Telephone Encounter (Signed)
Requested Prescriptions  Pending Prescriptions Disp Refills   amLODipine (NORVASC) 10 MG tablet 90 tablet 0    Sig: Take 1 tablet (10 mg total) by mouth daily.     Cardiovascular: Calcium Channel Blockers 2 Failed - 04/01/2023 11:55 AM      Failed - Last BP in normal range    BP Readings from Last 1 Encounters:  01/20/23 (!) 144/89         Passed - Last Heart Rate in normal range    Pulse Readings from Last 1 Encounters:  01/20/23 68         Passed - Valid encounter within last 6 months    Recent Outpatient Visits           3 months ago Benign hypertension   [place] [facility] [name], Sherran Needs, NP   4 months ago Benign hypertension   [place] [facility] [name], Oswaldo Conroy, PA-C   6 months ago Other fatigue   [place] [facility] [name], NP   7 months ago Benign hypertension   [place] [facility] [name], NP   9 months ago Annual physical exam   [place] [facility] [name], NP       Future Appointments             In 2 months [name], Tawanna Cooler, MD [facility]

## 2023-04-01 NOTE — Telephone Encounter (Signed)
Medication Refill - Medication: amLODipine (NORVASC) 10 MG tablet   Has the patient contacted their pharmacy? No.  Preferred Pharmacy (with phone number or street name):  [facility] Phone: [phone]  Fax: [phone]     Has the patient been seen for an appointment in the last year OR does the patient have an upcoming appointment? Yes.    Agent: Please be advised that RX refills may take up to 3 business days. We ask that you follow-up with your pharmacy.

## 2023-04-13 ENCOUNTER — Other Ambulatory Visit: Payer: Self-pay

## 2023-04-26 ENCOUNTER — Other Ambulatory Visit: Payer: Self-pay

## 2023-05-03 ENCOUNTER — Other Ambulatory Visit: Payer: Self-pay

## 2023-05-03 ENCOUNTER — Other Ambulatory Visit: Payer: Self-pay | Admitting: Nurse Practitioner

## 2023-05-03 MED FILL — Hydrochlorothiazide Cap 12.5 MG: ORAL | 30 days supply | Qty: 30 | Fill #1 | Status: AC

## 2023-05-04 ENCOUNTER — Other Ambulatory Visit: Payer: Self-pay | Admitting: Family Medicine

## 2023-05-04 ENCOUNTER — Other Ambulatory Visit: Payer: Self-pay

## 2023-05-06 ENCOUNTER — Other Ambulatory Visit: Payer: Self-pay

## 2023-05-06 MED FILL — Valsartan Tab 160 MG: ORAL | 30 days supply | Qty: 30 | Fill #0 | Status: AC

## 2023-05-06 MED FILL — Citalopram Hydrobromide Tab 20 MG (Base Equiv): ORAL | 30 days supply | Qty: 30 | Fill #0 | Status: AC

## 2023-05-06 NOTE — Telephone Encounter (Signed)
Patient has NP appointment 06/03/23- Rx extended to cover that appointment Requested Prescriptions  Pending Prescriptions Disp Refills   citalopram (CELEXA) 20 MG tablet 30 tablet 0    Sig: Take 1 tablet (20 mg total) by mouth daily.     Psychiatry:  Antidepressants - SSRI Passed - 05/03/2023 11:46 AM      Passed - Completed PHQ-2 or PHQ-9 in the last 360 days      Passed - Valid encounter within last 6 months    Recent Outpatient Visits           4 months ago Benign hypertension   [place] [facility] [place], Sherran Needs, NP   6 months ago Benign hypertension   [place] [facility] [name], Oswaldo Conroy, PA-C   8 months ago Other fatigue   [place] [facility] [name], NP   8 months ago Benign hypertension   [place] [facility] [name], NP   10 months ago Annual physical exam   [place] [facility] [name], NP       Future Appointments             In 4 weeks [name], Tawanna Cooler, MD [facility]             valsartan (DIOVAN) 160 MG tablet 30 tablet 0    Sig: Take 1 tablet (160 mg total) by mouth daily.     Cardiovascular:  Angiotensin Receptor Blockers Failed - 05/03/2023 11:46 AM      Failed - Last BP in normal range    BP Readings from Last 1 Encounters:  01/20/23 (!) 144/89         Passed - Cr in normal range and within 180 days    Creatinine, Ser  Date Value Ref Range Status  12/11/2022 0.70 0.57 - 1.00 mg/dL Final         Passed - K in normal range and within 180 days    Potassium  Date Value Ref Range Status  12/11/2022 4.4 3.5 - 5.2 mmol/L Final         Passed - Patient is not pregnant      Passed - Valid encounter within last 6 months    Recent Outpatient Visits           4 months ago Benign hypertension   [place] [facility] [place], Sherran Needs, NP   6 months ago Benign  hypertension   [place] [facility] [name], Oswaldo Conroy, PA-C   8 months ago Other fatigue   [place] [facility] [name], NP   8 months ago Benign hypertension   [place] [facility] [name], NP   10 months ago Annual physical exam   [place] [facility] [name], NP       Future Appointments             In 4 weeks [name], Tawanna Cooler, MD [facility]

## 2023-05-06 NOTE — Telephone Encounter (Signed)
Requested Prescriptions  Refused Prescriptions Disp Refills   citalopram (CELEXA) 20 MG tablet 90 tablet 0    Sig: Take 1 tablet (20 mg total) by mouth daily.     Psychiatry:  Antidepressants - SSRI Passed - 05/04/2023 12:25 PM      Passed - Completed PHQ-2 or PHQ-9 in the last 360 days      Passed - Valid encounter within last 6 months    Recent Outpatient Visits           4 months ago Benign hypertension   [place] [facility] [place], Sherran Needs, NP   6 months ago Benign hypertension   [place] [facility] [name], Oswaldo Conroy, PA-C   8 months ago Other fatigue   [place] [facility] [name], NP   8 months ago Benign hypertension   [place] [facility] [name], NP   10 months ago Annual physical exam   [place] [facility] [name], NP       Future Appointments             In 4 weeks [name], Tawanna Cooler, MD [facility]             valsartan (DIOVAN) 160 MG tablet 90 tablet 0    Sig: Take 1 tablet (160 mg total) by mouth daily.     Cardiovascular:  Angiotensin Receptor Blockers Failed - 05/04/2023 12:25 PM      Failed - Last BP in normal range    BP Readings from Last 1 Encounters:  01/20/23 (!) 144/89         Passed - Cr in normal range and within 180 days    Creatinine, Ser  Date Value Ref Range Status  12/11/2022 0.70 0.57 - 1.00 mg/dL Final         Passed - K in normal range and within 180 days    Potassium  Date Value Ref Range Status  12/11/2022 4.4 3.5 - 5.2 mmol/L Final         Passed - Patient is not pregnant      Passed - Valid encounter within last 6 months    Recent Outpatient Visits           4 months ago Benign hypertension   [place] [facility] [place], Sherran Needs, NP   6 months ago Benign hypertension   [place] [facility] [name], Oswaldo Conroy, PA-C   8  months ago Other fatigue   [place] [facility] [name], NP   8 months ago Benign hypertension   [place] [facility] [name], NP   10 months ago Annual physical exam   [place] [facility] [name], NP       Future Appointments             In 4 weeks [name], Tawanna Cooler, MD [facility]

## 2023-05-07 ENCOUNTER — Other Ambulatory Visit: Payer: Self-pay

## 2023-06-03 ENCOUNTER — Other Ambulatory Visit: Payer: Self-pay

## 2023-06-03 ENCOUNTER — Encounter: Payer: Self-pay | Admitting: Family Medicine

## 2023-06-03 ENCOUNTER — Ambulatory Visit: Payer: Commercial Managed Care - PPO | Admitting: Family Medicine

## 2023-06-03 VITALS — BP 162/86 | HR 71 | Resp 18 | Ht 67.0 in | Wt 376.0 lb

## 2023-06-03 DIAGNOSIS — R454 Irritability and anger: Secondary | ICD-10-CM | POA: Insufficient documentation

## 2023-06-03 DIAGNOSIS — I10 Essential (primary) hypertension: Secondary | ICD-10-CM | POA: Insufficient documentation

## 2023-06-03 DIAGNOSIS — F419 Anxiety disorder, unspecified: Secondary | ICD-10-CM | POA: Diagnosis not present

## 2023-06-03 DIAGNOSIS — F32A Depression, unspecified: Secondary | ICD-10-CM

## 2023-06-03 DIAGNOSIS — I1A0 Resistant hypertension: Secondary | ICD-10-CM | POA: Diagnosis not present

## 2023-06-03 DIAGNOSIS — R600 Localized edema: Secondary | ICD-10-CM | POA: Diagnosis not present

## 2023-06-03 MED ORDER — VALSARTAN-HYDROCHLOROTHIAZIDE 320-25 MG PO TABS
1.0000 | ORAL_TABLET | Freq: Every day | ORAL | 3 refills | Status: AC
Start: 2023-06-03 — End: ?
  Filled 2023-06-03: qty 90, 90d supply, fill #0
  Filled 2023-07-19 – 2023-09-14 (×2): qty 90, 90d supply, fill #1
  Filled 2023-12-23: qty 90, 90d supply, fill #2
  Filled 2024-04-07: qty 90, 90d supply, fill #3

## 2023-06-03 MED ORDER — BUSPIRONE HCL 5 MG PO TABS
5.0000 mg | ORAL_TABLET | Freq: Two times a day (BID) | ORAL | 3 refills | Status: DC
Start: 2023-06-03 — End: 2023-06-23
  Filled 2023-06-03: qty 60, 30d supply, fill #0

## 2023-06-03 MED ORDER — AMLODIPINE BESYLATE 5 MG PO TABS
5.0000 mg | ORAL_TABLET | Freq: Every day | ORAL | 0 refills | Status: DC
  Filled 2023-06-03: qty 90, 90d supply, fill #0

## 2023-06-03 NOTE — Assessment & Plan Note (Signed)
 Significant ankle edema, 2+, likely secondary to amlodipine . Reports pain on palpation. Discussed reducing amlodipine  dosage to mitigate edema. Need further evaluation to rule out heart failure and other causes. - Decrease amlodipine  to 5 mg - Order complete metabolic panel, CBC, and BMP to evaluate for heart failure and other causes of edema

## 2023-06-03 NOTE — Assessment & Plan Note (Signed)
 Chronic hypertension with current BP 162/86 mmHg, difficult to control despite valsartan  160 mg, metoprolol  100 mg, and hydrochlorothiazide  25 mg. Reports significant edema likely due to amlodipine . Stress and anxiety may contribute. Discussed increasing valsartan  to 320 mg combined with hydrochlorothiazide  to reduce pill burden. Decreasing amlodipine  to 5 mg to address edema. Emphasized regular follow-up and monitoring. - Increase valsartan  to 320 mg and combine with hydrochlorothiazide  - Decrease amlodipine  to 5 mg - Schedule follow-up in two weeks for BP and mood assessment - Order complete metabolic panel, CBC, thyroid  studies, and CMP

## 2023-06-03 NOTE — Assessment & Plan Note (Signed)
 Chronic  Worsened due to primary caregiver role for her mother  Started buspar 5 mg BID

## 2023-06-03 NOTE — Assessment & Plan Note (Signed)
 Chronic  Elevated GAD 7 score today  Reports increased irritability as primary caregiver for her mother  Recommended she continue citalopram 20mg  daily  Prescribed buspar 5mg  BID  Follow up in 2 weeks

## 2023-06-03 NOTE — Progress Notes (Signed)
 New patient visit   Patient: Nancy Garza   DOB: [birth year]   43 y.o. Female  MRN: [id number] Visit Date: 06/03/2023  Today's healthcare provider: Rockie Agent, MD   Chief Complaint  Patient presents with   Establish Care   Subjective    Nancy Garza is a 43 y.o. female who presents today as a new patient to establish care.      Discussed the use of AI scribe software for clinical note transcription with the patient, who gave verbal consent to proceed.  History of Present Illness   The patient, referred by Dr. Meg, presents with uncontrolled hypertension and significant lower extremity edema. Despite multiple medication adjustments, the patient reports that her blood pressure has never been well-controlled. She has been on amlodipine , which was discontinued due to edema, but the swelling persisted. The patient was also on chlorthalidone , but her blood pressure remained uncontrolled.  The patient has been experiencing a pain that ascends her neck, which temporarily alleviates with pressure. She denies any headaches, vision changes, or chest discomfort. However, she reports shortness of breath with walking and significant weight gain in a short period, raising concerns about possible heart failure.  In addition to her physical symptoms, the patient reports significant stress and irritability, particularly related to caring for her mother. She is currently on citalopram , but does not feel it is effective in managing her mood. She also reports occasional use of marijuana, particularly when feeling irritated.  The patient has a history of smoking, but quit after developing pneumonia a year ago. She reports a diet low in carbohydrates and high in chicken, but admits to eating large quantities when she does eat. She also reports a love for cheese and spicy food. The patient was previously exercising but stopped in December, with plans to resume gym visits.  The  patient also inquires about the possibility of starting Ozempic, suggesting a history of diabetes or weight management issues. However, this is not explicitly discussed in the conversation.          12/11/2022    4:12 PM 09/07/2022    3:51 PM 08/13/2022    3:23 PM 06/29/2022    3:22 PM  GAD 7 : Generalized Anxiety Score  Nervous, Anxious, on Edge 3 1 3  0  Control/stop worrying 3 1 3 1   Worry too much - different things 3 1 3 3   Trouble relaxing 3 1 3 3   Restless 3 1 0 3  Easily annoyed or irritable 3 1 3 3   Afraid - awful might happen 0 0 0 3  Total GAD 7 Score 18 6 15 16   Anxiety Difficulty Not difficult at all Somewhat difficult Somewhat difficult Somewhat difficult       Past Medical History:  Diagnosis Date   Abnormal Pap smear of cervix    LEEP procedure   Abnormal uterine bleeding 11/2022   Anxiety    Depression    Hyperlipidemia    Hypertension    Iron deficiency anemia    Morbid obesity with BMI of 50.0-59.9, adult (HCC)    Pneumonia    Pre-diabetes    Vitamin D  deficiency     Outpatient Medications Prior to Visit  Medication Sig   citalopram  (CELEXA ) 20 MG tablet Take 1 tablet (20 mg total) by mouth daily.   ferrous sulfate  325 (65 FE) MG tablet Take 1 tablet (325 mg total) by mouth daily with breakfast.   ibuprofen  (ADVIL ) 600 MG tablet Take 1  tablet (600 mg total) by mouth every 6 (six) hours as needed for moderate pain or mild pain.   metoprolol  succinate (TOPROL -XL) 100 MG 24 hr tablet Take 1 tablet (100 mg total) by mouth daily. Take with or immediately following a meal.   [DISCONTINUED] amLODipine  (NORVASC ) 10 MG tablet Take 1 tablet (10 mg total) by mouth daily.   [DISCONTINUED] hydrochlorothiazide  (MICROZIDE ) 12.5 MG capsule Take 1 capsule (12.5 mg total) by mouth daily.   [DISCONTINUED] valsartan  (DIOVAN ) 160 MG tablet Take 1 tablet (160 mg total) by mouth daily.   Vitamin D , Ergocalciferol , (DRISDOL ) 1.25 MG (50000 UNIT) CAPS capsule Take 1 capsule  (50,000 Units total) by mouth every 7 (seven) days. (Patient not taking: Reported on 06/03/2023)   [DISCONTINUED] norethindrone  (AYGESTIN ) 5 MG tablet Take 2 tablets (10 mg total) by mouth daily.   No facility-administered medications prior to visit.    Past Surgical History:  Procedure Laterality Date   HYSTEROSCOPY WITH D & C N/A 01/04/2023   Procedure: HYSTEROSCOPY DILATATION AND CURETTAGE;  Surgeon: Connell Davies, MD;  Location: [facility] ORS;  Service: Gynecology;  Laterality: N/A;   NO PAST SURGERIES     Family Status  Relation Name Status   Mother  Alive   Father  Alive   MGM  (Not Specified)   PGM  (Not Specified)  No partnership data on file   Family History  Problem Relation Age of Onset   Diabetes Mother    Hypertension Mother    Stroke Mother    COPD Mother    Congestive Heart Failure Mother    Hypertension Father    Diabetes Father    Hyperlipidemia Father    Diabetes Maternal Grandmother    Hypertension Maternal Grandmother    Stroke Maternal Grandmother    Congestive Heart Failure Maternal Grandmother    Diabetes Paternal Grandmother    Hypertension Paternal Grandmother    Stroke Paternal Grandmother    Kidney disease Paternal Grandmother    Social History   Socioeconomic History   Marital status: Single    Spouse name: Not on file   Number of children: 0   Years of education: Not on file   Highest education level: Not on file  Occupational History   Not on file  Tobacco Use   Smoking status: Former    Types: Cigarettes   Smokeless tobacco: Never   Tobacco comments:    hemp CBD  Vaping Use   Vaping status: Former  Substance and Sexual Activity   Alcohol use: Yes    Alcohol/week: 3.0 standard drinks of alcohol    Types: 3 Shots of liquor per week    Comment: on the weekends   Drug use: Yes    Types: Marijuana    Comment: occasional use   Sexual activity: Not Currently    Birth control/protection: None  Other Topics Concern   Not on file   Social History Narrative   Not on file   Social Drivers of Health   Financial Resource Strain: Not on file  Food Insecurity: Not on file  Transportation Needs: Not on file  Physical Activity: Not on file  Stress: Not on file  Social Connections: Not on file     No Known Allergies  Immunization History  Administered Date(s) Administered   Influenza,inj,Quad PF,6+ Mos 03/03/2019   Influenza-Unspecified 03/03/2019, 02/26/2020, 03/09/2022   PFIZER(Purple Top)SARS-COV-2 Vaccination 10/11/2019, 02/04/2020   Tdap 12/14/2014    Health Maintenance  Topic Date Due   Pneumococcal Vaccine  57-68 Years old (1 of 2 - PCV) Never done   MAMMOGRAM  Never done   COVID-19 Vaccine (3 - 2024-25 season) 01/31/2023   DTaP/Tdap/Td (2 - Td or Tdap) 12/13/2024   Cervical Cancer Screening (HPV/Pap Cotest)  06/29/2025   INFLUENZA VACCINE  Completed   Hepatitis C Screening  Completed   HIV Screening  Completed   HPV VACCINES  Aged Out    Patient Care Team: Sharma Coyer, MD as PCP - General (Family Medicine) Merlynn Lyle CROME, LCSW as [facility] Care Management (Licensed Clinical Social Worker)  Review of Systems  Last metabolic panel Lab Results  Component Value Date   GLUCOSE 107 (H) 12/11/2022   NA 141 12/11/2022   K 4.4 12/11/2022   CL 102 12/11/2022   CO2 26 12/11/2022   BUN 17 12/11/2022   CREATININE 0.70 12/11/2022   EGFR 112 12/11/2022   CALCIUM 9.5 12/11/2022   PROT 7.5 12/11/2022   ALBUMIN 4.3 12/11/2022   LABGLOB 3.2 12/11/2022   AGRATIO 1.3 06/29/2022   BILITOT <0.2 12/11/2022   ALKPHOS 50 12/11/2022   AST 17 12/11/2022   ALT 15 12/11/2022   Last thyroid  functions Lab Results  Component Value Date   TSH 1.730 09/07/2022   T4TOTAL 5.9 09/07/2022        Objective    BP (!) 162/86   Pulse 71   Resp 18   Ht 5' 7 (1.702 m)   Wt (!) 376 lb (170.6 kg)   SpO2 97%   BMI 58.89 kg/m   BP Readings from Last 3 Encounters:  06/03/23 (!)  162/86  01/20/23 (!) 144/89  01/04/23 (!) 158/84   Wt Readings from Last 3 Encounters:  06/03/23 (!) 376 lb (170.6 kg)  01/20/23 (!) 371 lb 8 oz (168.5 kg)  12/28/22 (!) 366 lb (166 kg)        Depression Screen    12/11/2022    4:12 PM 11/03/2022   11:38 AM 09/07/2022    3:50 PM 08/13/2022    3:23 PM  PHQ 2/9 Scores  PHQ - 2 Score 2 4 3 4   PHQ- 9 Score 16 4 14 12    No results found for any visits on 06/03/23.   Physical Exam Vitals reviewed.  Constitutional:      General: She is not in acute distress.    Appearance: Normal appearance. She is not ill-appearing, toxic-appearing or diaphoretic.  Eyes:     Conjunctiva/sclera: Conjunctivae normal.  Cardiovascular:     Rate and Rhythm: Normal rate and regular rhythm.     Pulses: Normal pulses.     Heart sounds: Normal heart sounds. No murmur heard.    No friction rub. No gallop.  Pulmonary:     Effort: Pulmonary effort is normal. No respiratory distress.     Breath sounds: Normal breath sounds. No stridor. No wheezing, rhonchi or rales.  Abdominal:     General: Bowel sounds are normal. There is no distension.     Palpations: Abdomen is soft.     Tenderness: There is no abdominal tenderness.  Musculoskeletal:     Right lower leg: 2+ Edema present.     Left lower leg: 2+ Edema present.  Skin:    Findings: No erythema or rash.  Neurological:     Mental Status: She is alert and oriented to person, place, and time.         Assessment & Plan      Problem List Items Addressed This  Visit       Cardiovascular and Mediastinum   Resistant hypertension - Primary   Chronic hypertension with current BP 162/86 mmHg, difficult to control despite valsartan  160 mg, metoprolol  100 mg, and hydrochlorothiazide  25 mg. Reports significant edema likely due to amlodipine . Stress and anxiety may contribute. Discussed increasing valsartan  to 320 mg combined with hydrochlorothiazide  to reduce pill burden. Decreasing amlodipine  to 5 mg to  address edema. Emphasized regular follow-up and monitoring. - Increase valsartan  to 320 mg and combine with hydrochlorothiazide  - Decrease amlodipine  to 5 mg - Schedule follow-up in two weeks for BP and mood assessment - Order complete metabolic panel, CBC, thyroid  studies, and CMP      Relevant Medications   valsartan -hydrochlorothiazide  (DIOVAN -HCT) 320-25 MG tablet   amLODipine  (NORVASC ) 5 MG tablet   Other Relevant Orders   CBC   TSH+T4F+T3Free   CMP14+EGFR   B Nat Peptide     Other   Morbid obesity (HCC)   Chronic  BMI 58.89 patient would like to discuss wegovy or zepbound  We will order TSH,T4 and T3 as well as CMP and A1c today and follow up at her next visit       Relevant Orders   Hemoglobin A1c   Irritability   Chronic  Worsened due to primary caregiver role for her mother  Started buspar  5 mg BID       Relevant Medications   busPIRone  (BUSPAR ) 5 MG tablet   Bilateral leg edema   Significant ankle edema, 2+, likely secondary to amlodipine . Reports pain on palpation. Discussed reducing amlodipine  dosage to mitigate edema. Need further evaluation to rule out heart failure and other causes. - Decrease amlodipine  to 5 mg - Order complete metabolic panel, CBC, and BMP to evaluate for heart failure and other causes of edema      Relevant Orders   TSH+T4F+T3Free   CMP14+EGFR   B Nat Peptide   Anxiety and depression   Chronic  Elevated GAD 7 score today  Reports increased irritability as primary caregiver for her mother  Recommended she continue citalopram  20mg  daily  Prescribed buspar  5mg  BID  Follow up in 2 weeks       Relevant Medications   busPIRone  (BUSPAR ) 5 MG tablet   Other Visit Diagnoses       Benign hypertension       Relevant Medications   valsartan -hydrochlorothiazide  (DIOVAN -HCT) 320-25 MG tablet   amLODipine  (NORVASC ) 5 MG tablet       General Health Maintenance Occasional marijuana use for stress, no tobacco use. History of  pneumonia related to vaping. Diet includes low carbs and baked foods, but large portions. Exercise routine stopped in December, plans to resume. Discussed balanced diet, portion control, and regular exercise. Advised against marijuana for stress management and provided smoking cessation resources. - Encourage resumption of exercise, aiming for 15-20 minutes of walking most days - Discuss balanced diet and portion control - Advise against marijuana for stress management     Return in about 2 weeks (around 06/17/2023) for HTN, Anxiety.      Rockie Agent, MD  [facility] [phone] (phone) [phone] (fax)  [facility]

## 2023-06-03 NOTE — Assessment & Plan Note (Signed)
 Chronic  BMI 58.89 patient would like to discuss wegovy or zepbound  We will order TSH,T4 and T3 as well as CMP and A1c today and follow up at her next visit

## 2023-06-05 LAB — CMP14+EGFR
ALT: 15 [IU]/L (ref 0–32)
AST: 16 [IU]/L (ref 0–40)
Albumin: 4.1 g/dL (ref 3.9–4.9)
Alkaline Phosphatase: 56 [IU]/L (ref 44–121)
BUN/Creatinine Ratio: 21 (ref 9–23)
BUN: 15 mg/dL (ref 6–24)
Bilirubin Total: <0.2 mg/dL (ref 0.0–1.2)
CO2: 26 mmol/L (ref 20–29)
Calcium: 9.4 mg/dL (ref 8.7–10.2)
Chloride: 101 mmol/L (ref 96–106)
Creatinine, Ser: 0.72 mg/dL (ref 0.57–1.00)
Globulin, Total: 3.1 g/dL (ref 1.5–4.5)
Glucose: 92 mg/dL (ref 70–99)
Potassium: 4.7 mmol/L (ref 3.5–5.2)
Sodium: 140 mmol/L (ref 134–144)
Total Protein: 7.2 g/dL (ref 6.0–8.5)
eGFR: 108 mL/min/{1.73_m2} (ref >59)

## 2023-06-05 LAB — TSH+T4F+T3FREE
Free T4: 0.95 ng/dL (ref 0.82–1.77)
T3, Free: 2.8 pg/mL (ref 2.0–4.4)
TSH: 2.36 u[IU]/mL (ref 0.450–4.500)

## 2023-06-05 LAB — CBC
Hematocrit: 35 % (ref 34.0–46.6)
Hemoglobin: 11.7 g/dL (ref 11.1–15.9)
MCH: 31.3 pg (ref 26.6–33.0)
MCHC: 33.4 g/dL (ref 31.5–35.7)
MCV: 94 fL (ref 79–97)
Platelets: 345 10*3/uL (ref 150–450)
RBC: 3.74 x10E6/uL — ABNORMAL LOW (ref 3.77–5.28)
RDW: 14.9 % (ref 11.7–15.4)
WBC: 6.2 10*3/uL (ref 3.4–10.8)

## 2023-06-05 LAB — HEMOGLOBIN A1C
Est. average glucose Bld gHb Est-mCnc: 114 mg/dL
Hgb A1c MFr Bld: 5.6 % (ref 4.8–5.6)

## 2023-06-05 LAB — BRAIN NATRIURETIC PEPTIDE: BNP: 56.6 pg/mL (ref 0.0–100.0)

## 2023-06-15 ENCOUNTER — Other Ambulatory Visit: Payer: Self-pay | Admitting: Nurse Practitioner

## 2023-06-15 ENCOUNTER — Other Ambulatory Visit: Payer: Self-pay

## 2023-06-16 ENCOUNTER — Other Ambulatory Visit: Payer: Self-pay

## 2023-06-16 MED FILL — Citalopram Hydrobromide Tab 20 MG (Base Equiv): ORAL | 90 days supply | Qty: 90 | Fill #0 | Status: AC

## 2023-06-16 NOTE — Telephone Encounter (Signed)
 Requested Prescriptions  Pending Prescriptions Disp Refills   citalopram  (CELEXA ) 20 MG tablet 90 tablet 0    Sig: Take 1 tablet (20 mg total) by mouth daily.     Psychiatry:  Antidepressants - SSRI Passed - 06/16/2023  9:47 AM      Passed - Completed PHQ-2 or PHQ-9 in the last 360 days      Passed - Valid encounter within last 6 months    Recent Outpatient Visits           1 week ago Resistant hypertension   [place] [facility] [name], Rodey, MD   6 months ago Benign hypertension   [place] [facility] Moro, Basilia Lima, NP   7 months ago Benign hypertension   [facility] [facility] [name], Pearla Bottom, PA-C   9 months ago Other fatigue   [place] Aileen Alexanders, NP   10 months ago Benign hypertension   [place] [facility] Aileen Alexanders, NP       Future Appointments             In 1 week [name], Judyann Number, MD [facility]

## 2023-06-22 NOTE — Progress Notes (Unsigned)
      Established patient visit   Patient: Nancy Garza   DOB: April 03, 1981   43 y.o. Female  MRN: [id number] Visit Date: 06/23/2023  Today's healthcare provider: Ronnald Ramp, MD   No chief complaint on file.  Subjective       Discussed the use of AI scribe software for clinical note transcription with the patient, who gave verbal consent to proceed.  History of Present Illness             Past Medical History:  Diagnosis Date   Abnormal Pap smear of cervix    LEEP procedure   Abnormal uterine bleeding 11/2022   Anxiety    Depression    Hyperlipidemia    Hypertension    Iron deficiency anemia    Morbid obesity with BMI of 50.0-59.9, adult (HCC)    Pneumonia    Pre-diabetes    Vitamin D deficiency     Medications: Outpatient Medications Prior to Visit  Medication Sig   amLODipine (NORVASC) 5 MG tablet Take 1 tablet (5 mg total) by mouth daily.   busPIRone (BUSPAR) 5 MG tablet Take 1 tablet (5 mg total) by mouth 2 (two) times daily.   citalopram (CELEXA) 20 MG tablet Take 1 tablet (20 mg total) by mouth daily.   ferrous sulfate 325 (65 FE) MG tablet Take 1 tablet (325 mg total) by mouth daily with breakfast.   ibuprofen (ADVIL) 600 MG tablet Take 1 tablet (600 mg total) by mouth every 6 (six) hours as needed for moderate pain or mild pain.   metoprolol succinate (TOPROL-XL) 100 MG 24 hr tablet Take 1 tablet (100 mg total) by mouth daily. Take with or immediately following a meal.   valsartan-hydrochlorothiazide (DIOVAN-HCT) 320-25 MG tablet Take 1 tablet by mouth daily.   Vitamin D, Ergocalciferol, (DRISDOL) 1.25 MG (50000 UNIT) CAPS capsule Take 1 capsule (50,000 Units total) by mouth every 7 (seven) days. (Patient not taking: Reported on 06/03/2023)   No facility-administered medications prior to visit.    Review of Systems  {Insert previous labs (optional):23779} {See past labs  Heme  Chem  Endocrine  Serology  Results Review  (optional):1}   Objective    There were no vitals taken for this visit. {Insert last BP/Wt (optional):23777}{See vitals history (optional):1}    Physical Exam  ***  No results found for any visits on 06/23/23.  Assessment & Plan     Problem List Items Addressed This Visit   None   Assessment and Plan              No follow-ups on file.         Ronnald Ramp, MD  [facility] [phone] (phone) [phone] (fax)  [facility]

## 2023-06-23 ENCOUNTER — Ambulatory Visit (INDEPENDENT_AMBULATORY_CARE_PROVIDER_SITE_OTHER): Payer: Commercial Managed Care - PPO | Admitting: Family Medicine

## 2023-06-23 ENCOUNTER — Encounter: Payer: Self-pay | Admitting: Family Medicine

## 2023-06-23 ENCOUNTER — Other Ambulatory Visit: Payer: Self-pay

## 2023-06-23 VITALS — BP 135/80 | HR 70 | Ht 67.0 in | Wt 375.0 lb

## 2023-06-23 DIAGNOSIS — E559 Vitamin D deficiency, unspecified: Secondary | ICD-10-CM | POA: Diagnosis not present

## 2023-06-23 DIAGNOSIS — I10 Essential (primary) hypertension: Secondary | ICD-10-CM

## 2023-06-23 DIAGNOSIS — D509 Iron deficiency anemia, unspecified: Secondary | ICD-10-CM

## 2023-06-23 DIAGNOSIS — E7800 Pure hypercholesterolemia, unspecified: Secondary | ICD-10-CM | POA: Diagnosis not present

## 2023-06-23 DIAGNOSIS — F419 Anxiety disorder, unspecified: Secondary | ICD-10-CM

## 2023-06-23 DIAGNOSIS — F32A Depression, unspecified: Secondary | ICD-10-CM | POA: Diagnosis not present

## 2023-06-23 MED ORDER — BUSPIRONE HCL 10 MG PO TABS
10.0000 mg | ORAL_TABLET | Freq: Two times a day (BID) | ORAL | 0 refills | Status: DC
  Filled 2023-06-23: qty 60, 30d supply, fill #0

## 2023-06-23 MED ORDER — CITALOPRAM HYDROBROMIDE 40 MG PO TABS
40.0000 mg | ORAL_TABLET | Freq: Every day | ORAL | 3 refills | Status: AC
Start: 2023-06-23 — End: ?
  Filled 2023-06-23: qty 30, 30d supply, fill #0
  Filled 2023-07-30 (×2): qty 30, 30d supply, fill #1
  Filled 2023-08-31: qty 30, 30d supply, fill #2
  Filled 2023-10-12: qty 30, 30d supply, fill #3

## 2023-06-23 NOTE — Assessment & Plan Note (Signed)
Currently on ferrous sulfate 325 mg daily. No new symptoms or concerns discussed. - Continue ferrous sulfate 325 mg daily

## 2023-06-23 NOTE — Assessment & Plan Note (Signed)
Previously prescribed high-dose vitamin D. No new symptoms or concerns discussed. - Continue current vitamin D supplementation as previously prescribed

## 2023-06-23 NOTE — Patient Instructions (Addendum)
Please ask if your insurance company will cover the following medications for obesity:   Wegovy (semaglutide)  Zepbound (tirzepatide)  Contrave   BMI 58.73

## 2023-06-23 NOTE — Assessment & Plan Note (Signed)
Hypertension well-controlled with a blood pressure reading of 135/80 mmHg, improved from 162/86 mmHg at the last visit. Current regimen includes amlodipine 5 mg, metoprolol 100 mg, valsartan 320 mg, and hydrochlorothiazide 25 mg daily. chronic - Continue current antihypertensive regimen - Follow up in 6 weeks

## 2023-06-23 NOTE — Assessment & Plan Note (Signed)
Persistent anxiety and feeling constantly on edge despite current treatment with Buspar 5 mg once daily and Celexa 20 mg daily. Significant stress from work and caregiving responsibilities. Discussed increasing dosages for better symptom management. Explained that Buspar is at a low dose and can be increased. Celexa can be increased to a maximum of 40 mg daily. - Increase Buspar dosage - Increase Celexa to 40 mg daily - Follow up in 6 weeks

## 2023-06-23 NOTE — Assessment & Plan Note (Signed)
Elevated LDL (115 mg/dL) and triglycerides (166 mg/dL) from the last lab results. No new treatment changes discussed. - Continue current management - Monitor lipid levels in future labs

## 2023-06-23 NOTE — Assessment & Plan Note (Signed)
Concern about weight and inquired about weight loss medications. Discussed options including Wegovy, Zepbound, and Contrave. Advised to check with insurance for coverage. Explained need for prior authorization and potential side effects including constipation, nausea, and loss of appetite. Discussed anticipated outcomes, including potential weight loss of 5% of body weight within the first month and a half,  Emphasized the importance of combining medication with exercise and diet for optimal results. Current BMI 58.73, 375lbs current weight  No current medications for weight loss  Has not tried other medications for weight loss in the past  - Contact insurance regarding coverage for weight loss medications - If covered, initiate weight loss medication and follow up in 6 weeks - Monitor for side effects such as constipation, nausea, and loss of appetite

## 2023-06-24 ENCOUNTER — Encounter: Payer: Self-pay | Admitting: Family Medicine

## 2023-06-24 ENCOUNTER — Other Ambulatory Visit: Payer: Self-pay

## 2023-07-01 ENCOUNTER — Encounter: Payer: Self-pay | Admitting: Family Medicine

## 2023-07-02 ENCOUNTER — Other Ambulatory Visit: Payer: Self-pay | Admitting: Family Medicine

## 2023-07-02 ENCOUNTER — Telehealth: Payer: Self-pay | Admitting: Family Medicine

## 2023-07-02 ENCOUNTER — Other Ambulatory Visit: Payer: Self-pay

## 2023-07-02 DIAGNOSIS — R7303 Prediabetes: Secondary | ICD-10-CM

## 2023-07-02 MED ORDER — CONTRAVE 8-90 MG PO TB12
ORAL_TABLET | ORAL | 1 refills | Status: DC
  Filled 2023-07-02: qty 120, 63d supply, fill #0
  Filled 2023-07-05: qty 120, 30d supply, fill #0
  Filled 2023-07-27: qty 120, 63d supply, fill #0

## 2023-07-02 NOTE — Progress Notes (Signed)
Prescription for contrave 8-90 mg submitted for obesity with starting weight of 375mg  daily   Patient reports insurance requires PA. Please initiate PA using office visit note for 06/23/23

## 2023-07-02 NOTE — Telephone Encounter (Signed)
[facility] requesting PA Key: [id number] Contrave 8-90MG  ER tablets

## 2023-07-05 ENCOUNTER — Telehealth: Payer: Commercial Managed Care - PPO | Admitting: Nurse Practitioner

## 2023-07-05 ENCOUNTER — Other Ambulatory Visit: Payer: Self-pay

## 2023-07-05 DIAGNOSIS — K047 Periapical abscess without sinus: Secondary | ICD-10-CM | POA: Diagnosis not present

## 2023-07-05 MED ORDER — IBUPROFEN 600 MG PO TABS
600.0000 mg | ORAL_TABLET | Freq: Three times a day (TID) | ORAL | 0 refills | Status: DC | PRN
  Filled 2023-07-05: qty 30, 10d supply, fill #0

## 2023-07-05 MED ORDER — AMOXICILLIN-POT CLAVULANATE 875-125 MG PO TABS
1.0000 | ORAL_TABLET | Freq: Two times a day (BID) | ORAL | 0 refills | Status: DC
  Filled 2023-07-05: qty 14, 7d supply, fill #0

## 2023-07-05 NOTE — Progress Notes (Signed)

## 2023-07-09 ENCOUNTER — Telehealth: Payer: Self-pay | Admitting: Family Medicine

## 2023-07-09 NOTE — Telephone Encounter (Signed)
 Covermymeds is requesting prior authorization Key: [id number] Contrave  8-90MG  ER tablets

## 2023-07-14 ENCOUNTER — Telehealth: Payer: Self-pay

## 2023-07-14 ENCOUNTER — Other Ambulatory Visit (HOSPITAL_COMMUNITY): Payer: Self-pay

## 2023-07-14 NOTE — Telephone Encounter (Signed)
Pharmacy Patient Advocate Encounter   Received notification from Pt Calls Messages that prior authorization for Contrave 8-90MG  er tablets is required/requested.   Insurance verification completed.   The patient is insured through [facility] .   Per test claim: PA required; PA submitted to above mentioned insurance via CoverMyMeds Key/confirmation #/EOC [id number] Status is pending

## 2023-07-19 ENCOUNTER — Other Ambulatory Visit: Payer: Self-pay

## 2023-07-19 ENCOUNTER — Encounter: Payer: Self-pay | Admitting: Family Medicine

## 2023-07-19 NOTE — Telephone Encounter (Signed)
 Pharmacy Patient Advocate Encounter  Received notification from [facility] that Prior Authorization for  Contrave 8-90MG  er tablets  has been DENIED.  Full denial letter will be uploaded to the media tab. See denial reason below.   PA #/Case ID/Reference #: [id number]

## 2023-07-27 ENCOUNTER — Telehealth: Payer: Self-pay

## 2023-07-27 ENCOUNTER — Other Ambulatory Visit: Payer: Self-pay

## 2023-07-27 NOTE — Telephone Encounter (Signed)
 Copied from CRM [id number]. Topic: Clinical - Prescription Issue >> Jul 27, 2023 12:44 PM Antony Haste wrote: Reason for CRM: PT states she has been awaiting a reply from her provider regarding her prescription issue for Naltrexone-buPROPion HCl ER (CONTRAVE) 8-90 MG TB12. Her pharmacy states they have not received the order for this prescription. Advised it was signed off by her provider on 01/31 to [facility], the patient states she has not been able to pick it up because her pharmacy has been telling her it's not approved/authorized.

## 2023-07-27 NOTE — Telephone Encounter (Signed)
 Unable to view message. Please resend.

## 2023-07-28 NOTE — Telephone Encounter (Unsigned)
 Copied from CRM [id number]. Topic: Clinical - Prescription Issue >> Jul 27, 2023  1:16 PM Ivette P wrote: Reason for CRM: Pt calling in for Naltrexone-buPROPion HCl ER (CONTRAVE) 8-90 MG TB12, insurance is requiring a Prior Authorization to be able to release meds to pt. Pt would like a call to know when PA is sent to pharmacy.   Pt Callback [phone]

## 2023-07-28 NOTE — Telephone Encounter (Signed)
 Prior authorization for contrave was denied due to patient not being enrolled in exercise or calorie reduction program.

## 2023-07-30 ENCOUNTER — Other Ambulatory Visit: Payer: Self-pay

## 2023-07-30 ENCOUNTER — Other Ambulatory Visit: Payer: Self-pay | Admitting: Family Medicine

## 2023-07-30 DIAGNOSIS — I10 Essential (primary) hypertension: Secondary | ICD-10-CM

## 2023-07-30 MED ORDER — AMLODIPINE BESYLATE 5 MG PO TABS
5.0000 mg | ORAL_TABLET | Freq: Every day | ORAL | 1 refills | Status: DC
  Filled 2023-07-30 – 2023-10-13 (×2): qty 90, 90d supply, fill #0
  Filled 2024-02-01: qty 90, 90d supply, fill #1

## 2023-07-30 MED FILL — Metoprolol Succinate Tab ER 24HR 100 MG (Tartrate Equiv): ORAL | 90 days supply | Qty: 90 | Fill #0 | Status: AC

## 2023-07-30 NOTE — Telephone Encounter (Signed)
 Requested medication (s) are due for refill today: yes   Requested medication (s) are on the active medication list: yes   Last refill:  03/19/23 #90 0 refills  Future visit scheduled: yes in 5 days   Notes to clinic:  last ordered by [name], NP 03/19/23. Do you want to refill Rx?     Requested Prescriptions  Pending Prescriptions Disp Refills   metoprolol succinate (TOPROL-XL) 100 MG 24 hr tablet 90 tablet 0    Sig: Take 1 tablet (100 mg total) by mouth daily. Take with or immediately following a meal.     Cardiovascular:  Beta Blockers Passed - 07/30/2023  2:36 PM      Passed - Last BP in normal range    BP Readings from Last 1 Encounters:  06/23/23 135/80         Passed - Last Heart Rate in normal range    Pulse Readings from Last 1 Encounters:  06/23/23 70         Passed - Valid encounter within last 6 months    Recent Outpatient Visits           1 month ago Primary hypertension   [place] [facility] [name], [place], MD   1 month ago Resistant hypertension   [place] [facility] [place], [place], MD   7 months ago Benign hypertension   [place] [facility] Gwinner, Sherran Needs, NP   8 months ago Benign hypertension   [place] [facility] [name], Oswaldo Conroy, PA-C   10 months ago Other fatigue   [place] [facility] [name], NP       Future Appointments             In 5 days [name], Tawanna Cooler, MD [facility]

## 2023-07-30 NOTE — Telephone Encounter (Signed)
 Requested medication (s) are due for refill today: duplicate request   Requested medication (s) are on the active medication list: yes   Last refill:  03/19/23 #90 0 refills  Future visit scheduled: yes in 5 days  Notes to clinic:  last ordered by [name], NP. Do you want to order Rx? 2nd request for refill from pharmacy      Requested Prescriptions  Pending Prescriptions Disp Refills   metoprolol succinate (TOPROL-XL) 100 MG 24 hr tablet 90 tablet 0    Sig: Take 1 tablet (100 mg total) by mouth daily. Take with or immediately following a meal.     Cardiovascular:  Beta Blockers Passed - 07/30/2023  3:40 PM      Passed - Last BP in normal range    BP Readings from Last 1 Encounters:  06/23/23 135/80         Passed - Last Heart Rate in normal range    Pulse Readings from Last 1 Encounters:  06/23/23 70         Passed - Valid encounter within last 6 months    Recent Outpatient Visits           1 month ago Primary hypertension   [facility] [name], [place], MD   1 month ago Resistant hypertension   [place] Nor[place], [place], MD   7 months ago Benign hypertension   [place], Sherran Needs, NP   8 months ago Benign hypertension   [place] [facility] [name], Oswaldo Conroy, PA-C   10 months ago Other fatigue   [facility] [name], NP       Future Appointments             In 5 days [name], Tawanna Cooler, MD [facility]

## 2023-07-30 NOTE — Telephone Encounter (Signed)
 Copied from CRM [id number]. Topic: Clinical - Prescription Issue >> Jul 30, 2023 12:51 PM [name] wrote: Reason for CRM: Patient is having issues refilling her Contrave prescription.  States this is the third time she has called regarding this and no one has called her back.  Patient wishes to speak with someone in the office today about this.

## 2023-07-30 NOTE — Telephone Encounter (Signed)
 LMTCB- Ok for [facility] nurse to give patient provider's message.  Provider's message: Ronnald Ramp, MD    07/28/23  1:05 PM Note Prior authorization for contrave was denied due to patient not being enrolled in exercise or calorie reduction program.

## 2023-08-02 ENCOUNTER — Telehealth: Payer: Self-pay

## 2023-08-02 ENCOUNTER — Other Ambulatory Visit: Payer: Self-pay

## 2023-08-02 MED ORDER — PHENTERMINE HCL 30 MG PO CAPS
30.0000 mg | ORAL_CAPSULE | ORAL | 0 refills | Status: DC
  Filled 2023-08-02: qty 30, 30d supply, fill #0

## 2023-08-02 NOTE — Telephone Encounter (Signed)
 Please see the message below.  Nancy Garza

## 2023-08-02 NOTE — Telephone Encounter (Signed)
 Copied from CRM [id number]. Topic: Clinical - Prescription Issue >> Aug 02, 2023  9:57 AM Epimenio Foot F wrote: Reason for CRM: Patient is calling in because her insurance denied Contrave. Patient wants to know if Dr. Roxan Hockey can prescribe her Phentermine because she believes her insurance will approve that due to someone who has her same insurance getting the medication. Please follow up with patient.

## 2023-08-02 NOTE — Telephone Encounter (Signed)
 Left vm for pt to call back to r/s appt that is on 08/04/2023 to 1 month out.  If pt returns call please r/s.

## 2023-08-02 NOTE — Telephone Encounter (Signed)
 Phentermine 30mg  daily prescribed for weight management. Pt will need to follow up in 1 month for BP and weight check  Please reschedule 08/04/23 appt

## 2023-08-04 ENCOUNTER — Ambulatory Visit: Payer: Self-pay | Admitting: Family Medicine

## 2023-08-31 ENCOUNTER — Other Ambulatory Visit: Payer: Self-pay | Admitting: Family Medicine

## 2023-08-31 ENCOUNTER — Other Ambulatory Visit: Payer: Self-pay

## 2023-08-31 DIAGNOSIS — F32A Depression, unspecified: Secondary | ICD-10-CM

## 2023-09-01 ENCOUNTER — Other Ambulatory Visit: Payer: Self-pay

## 2023-09-02 ENCOUNTER — Other Ambulatory Visit: Payer: Self-pay

## 2023-09-02 MED FILL — Buspirone HCl Tab 10 MG: ORAL | 30 days supply | Qty: 60 | Fill #0 | Status: AC

## 2023-09-02 MED FILL — Phentermine HCl Cap 30 MG: ORAL | 30 days supply | Qty: 30 | Fill #0 | Status: AC

## 2023-09-07 ENCOUNTER — Ambulatory Visit: Admitting: Family Medicine

## 2023-09-14 ENCOUNTER — Other Ambulatory Visit: Payer: Self-pay

## 2023-10-04 ENCOUNTER — Ambulatory Visit: Admitting: Family Medicine

## 2023-10-12 ENCOUNTER — Other Ambulatory Visit: Payer: Self-pay | Admitting: Family Medicine

## 2023-10-12 ENCOUNTER — Other Ambulatory Visit: Payer: Self-pay

## 2023-10-12 MED ORDER — PHENTERMINE HCL 30 MG PO CAPS
30.0000 mg | ORAL_CAPSULE | ORAL | 0 refills | Status: DC
  Filled 2023-10-12: qty 30, 30d supply, fill #0

## 2023-10-13 ENCOUNTER — Other Ambulatory Visit: Payer: Self-pay

## 2023-11-08 ENCOUNTER — Ambulatory Visit: Admitting: Family Medicine

## 2023-11-16 ENCOUNTER — Other Ambulatory Visit: Payer: Self-pay | Admitting: Family Medicine

## 2023-11-16 ENCOUNTER — Other Ambulatory Visit: Payer: Self-pay

## 2023-11-16 DIAGNOSIS — F32A Depression, unspecified: Secondary | ICD-10-CM

## 2023-11-16 DIAGNOSIS — I10 Essential (primary) hypertension: Secondary | ICD-10-CM

## 2023-11-16 DIAGNOSIS — F419 Anxiety disorder, unspecified: Secondary | ICD-10-CM

## 2023-11-16 MED ORDER — CITALOPRAM HYDROBROMIDE 40 MG PO TABS
40.0000 mg | ORAL_TABLET | Freq: Every day | ORAL | 3 refills | Status: DC
  Filled 2023-11-16: qty 30, 30d supply, fill #0
  Filled 2023-12-23: qty 30, 30d supply, fill #1
  Filled 2024-02-01: qty 30, 30d supply, fill #2
  Filled 2024-03-13: qty 30, 30d supply, fill #3

## 2023-11-16 MED ORDER — METOPROLOL SUCCINATE ER 100 MG PO TB24
100.0000 mg | ORAL_TABLET | Freq: Every day | ORAL | 0 refills | Status: DC
  Filled 2023-11-16: qty 30, 30d supply, fill #0

## 2023-11-18 ENCOUNTER — Other Ambulatory Visit: Payer: Self-pay

## 2023-11-18 NOTE — Telephone Encounter (Signed)
 LOV 06/26/23 NOV none LRF April and May  LABS 06/03/23

## 2023-11-18 NOTE — Telephone Encounter (Signed)
 Requested medication (s) are due for refill today: yes  Requested medication (s) are on the active medication list: yes  Last refill:  09/15/23  Future visit scheduled: no  Notes to clinic:  Unable to refill per protocol, cannot delegate. courtesy refill already given, routing for provider approval.       Requested Prescriptions  Pending Prescriptions Disp Refills   busPIRone  (BUSPAR ) 10 MG tablet 60 tablet 0    Sig: Take 1 tablet (10 mg total) by mouth 2 (two) times daily.     Psychiatry: Anxiolytics/Hypnotics - Non-controlled Failed - 11/18/2023  3:53 PM      Failed - Valid encounter within last 12 months    Recent Outpatient Visits   None             phentermine  30 MG capsule 30 capsule 0    Sig: Take 1 capsule (30 mg total) by mouth every morning.     Not Delegated - Neurology: Anticonvulsants - Controlled - phentermine  hydrochloride Failed - 11/18/2023  3:53 PM      Failed - This refill cannot be delegated      Failed - Valid encounter within last 6 months    Recent Outpatient Visits   None            Failed - Weight completed in the last 3 months    Wt Readings from Last 1 Encounters:  06/23/23 (!) 375 lb (170.1 kg)         Passed - eGFR in normal range and within 360 days    GFR calc Af Amer  Date Value Ref Range Status  03/03/2019 103 >59 mL/min/1.73 Final   GFR calc non Af Amer  Date Value Ref Range Status  03/03/2019 90 >59 mL/min/1.73 Final   eGFR  Date Value Ref Range Status  06/03/2023 108 >59 mL/min/1.73 Final         Passed - Cr in normal range and within 360 days    Creatinine, Ser  Date Value Ref Range Status  06/03/2023 0.72 0.57 - 1.00 mg/dL Final         Passed - Last BP in normal range    BP Readings from Last 1 Encounters:  06/23/23 135/80

## 2023-11-19 ENCOUNTER — Other Ambulatory Visit: Payer: Self-pay

## 2023-11-22 ENCOUNTER — Other Ambulatory Visit: Payer: Self-pay

## 2023-11-23 ENCOUNTER — Other Ambulatory Visit: Payer: Self-pay

## 2023-11-23 MED FILL — Phentermine HCl Cap 30 MG: ORAL | 30 days supply | Qty: 30 | Fill #0 | Status: AC

## 2023-11-23 MED FILL — Buspirone HCl Tab 10 MG: ORAL | 30 days supply | Qty: 60 | Fill #0 | Status: AC

## 2023-11-25 ENCOUNTER — Other Ambulatory Visit: Payer: Self-pay

## 2023-12-23 ENCOUNTER — Other Ambulatory Visit: Payer: Self-pay

## 2023-12-23 ENCOUNTER — Other Ambulatory Visit: Payer: Self-pay | Admitting: Family Medicine

## 2023-12-23 DIAGNOSIS — I10 Essential (primary) hypertension: Secondary | ICD-10-CM

## 2023-12-23 MED ORDER — METOPROLOL SUCCINATE ER 100 MG PO TB24
100.0000 mg | ORAL_TABLET | Freq: Every day | ORAL | 0 refills | Status: DC
  Filled 2023-12-23: qty 30, 30d supply, fill #0

## 2023-12-27 ENCOUNTER — Other Ambulatory Visit: Payer: Self-pay

## 2023-12-27 MED ORDER — BLOOD PRESSURE MONITOR MISC
0 refills | Status: DC
  Filled 2023-12-27: qty 1, 1d supply, fill #0

## 2024-02-01 ENCOUNTER — Other Ambulatory Visit: Payer: Self-pay | Admitting: Nurse Practitioner

## 2024-02-01 ENCOUNTER — Other Ambulatory Visit: Payer: Self-pay

## 2024-02-01 ENCOUNTER — Other Ambulatory Visit: Payer: Self-pay | Admitting: Family Medicine

## 2024-02-01 ENCOUNTER — Telehealth: Payer: Self-pay | Admitting: Family Medicine

## 2024-02-01 DIAGNOSIS — I10 Essential (primary) hypertension: Secondary | ICD-10-CM

## 2024-02-01 NOTE — Telephone Encounter (Signed)
 [facility] is asking for refills on Ferrous Sulfate  325 (65FE) mg tablets #90 with refills

## 2024-02-02 ENCOUNTER — Other Ambulatory Visit: Payer: Self-pay

## 2024-02-03 ENCOUNTER — Other Ambulatory Visit: Payer: Self-pay

## 2024-02-04 ENCOUNTER — Telehealth: Payer: Self-pay | Admitting: Family Medicine

## 2024-02-04 ENCOUNTER — Other Ambulatory Visit: Payer: Self-pay

## 2024-02-04 DIAGNOSIS — I10 Essential (primary) hypertension: Secondary | ICD-10-CM

## 2024-02-04 MED ORDER — METOPROLOL SUCCINATE ER 100 MG PO TB24
100.0000 mg | ORAL_TABLET | Freq: Every day | ORAL | 0 refills | Status: DC
  Filled 2024-02-04: qty 30, 30d supply, fill #0

## 2024-02-04 NOTE — Telephone Encounter (Signed)
 Patient needs appointment.

## 2024-02-05 ENCOUNTER — Other Ambulatory Visit: Payer: Self-pay

## 2024-02-05 ENCOUNTER — Other Ambulatory Visit: Payer: Self-pay | Admitting: Nurse Practitioner

## 2024-02-06 ENCOUNTER — Other Ambulatory Visit: Payer: Self-pay

## 2024-02-07 ENCOUNTER — Other Ambulatory Visit: Payer: Self-pay | Admitting: Family Medicine

## 2024-02-07 ENCOUNTER — Other Ambulatory Visit: Payer: Self-pay

## 2024-02-07 NOTE — Addendum Note (Signed)
 Addended by: CHERRY CHIQUITA HERO on: 02/07/2024 11:42 AM   Modules accepted: Orders

## 2024-02-07 NOTE — Telephone Encounter (Signed)
 LOV: 06/23/2023 NOV: None scheduled  LRF: 09/25/2022 #90 1rf

## 2024-02-07 NOTE — Telephone Encounter (Signed)
 [facility] is asking for refills on Ferrous Sulfate  325 (65FE) MG tab #90

## 2024-02-09 ENCOUNTER — Other Ambulatory Visit: Payer: Self-pay

## 2024-02-09 MED ORDER — FERROUS SULFATE 325 (65 FE) MG PO TABS
325.0000 mg | ORAL_TABLET | Freq: Every day | ORAL | 1 refills | Status: AC
  Filled 2024-02-09: qty 90, 90d supply, fill #0

## 2024-02-15 IMAGING — CR DG CHEST 2V
2 series · 2 of 2 positions shown · non-contrast
Comparison: None Available.

CLINICAL DATA: Shortness of breath, cough.

EXAM:
CHEST - 2 VIEW

[chest pa]
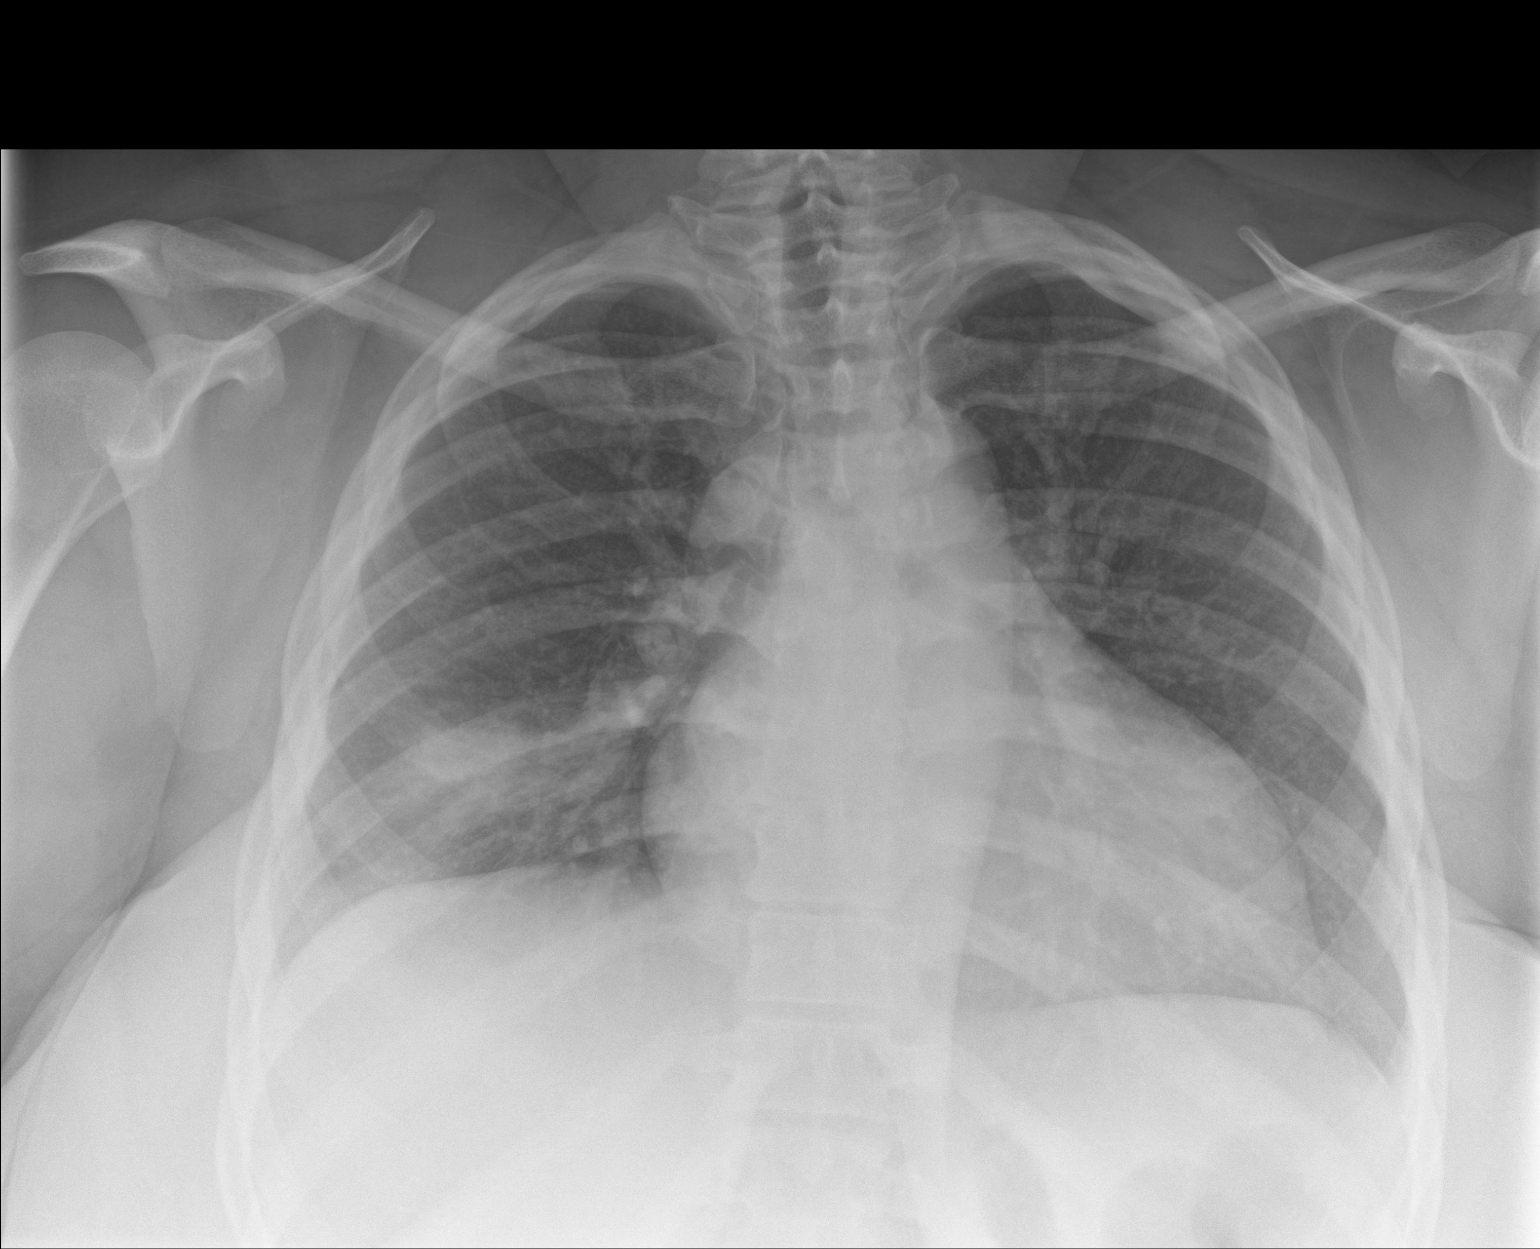

[chest lat]
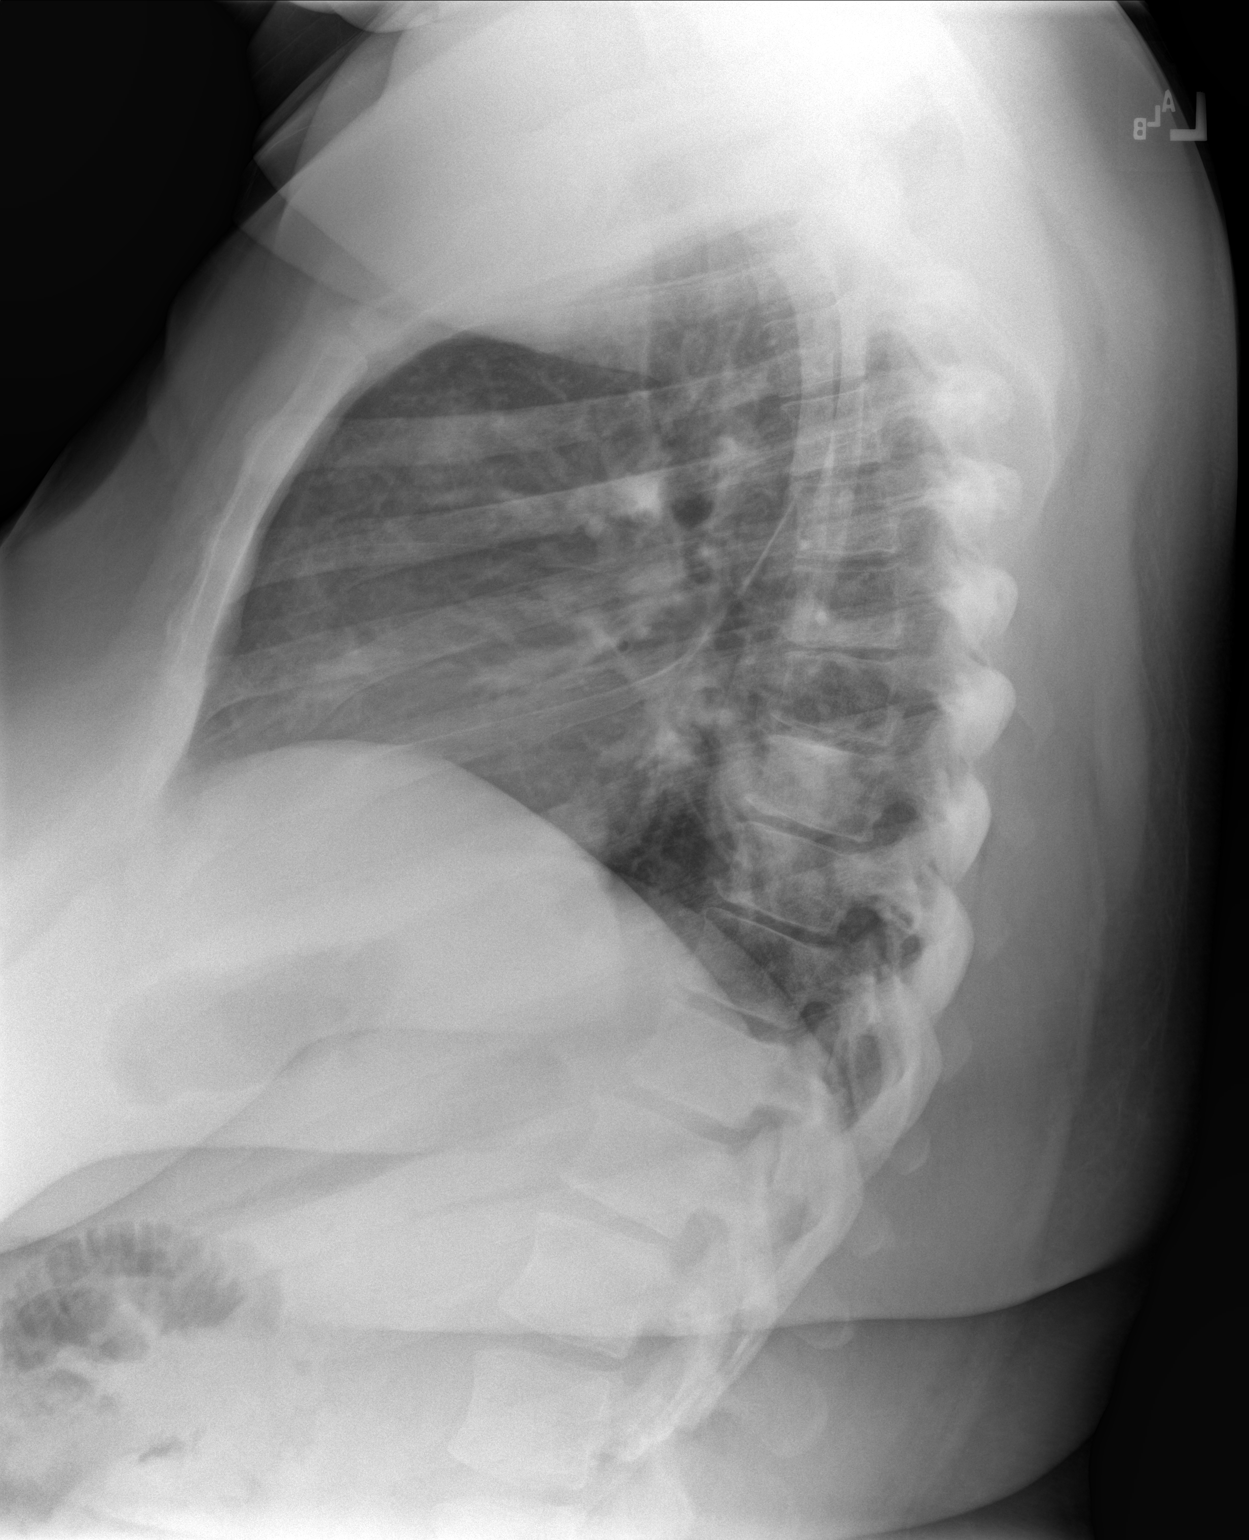

[2 of 2 positions shown; findings below may reference images not displayed]

FINDINGS: The heart size and mediastinal contours are within normal limits.
Right lower lobe opacity is noted which may represent pneumonia, but
it appears to be rounded on the lateral projection suggesting
possible mass. The visualized skeletal structures are unremarkable.
IMPRESSION: Right lower lobe opacity is noted which may simply represent
pneumonia, but it appears to be rounded on the lateral projection
suggesting possible mass. CT scan of the chest is recommended for
further evaluation.

## 2024-02-21 ENCOUNTER — Other Ambulatory Visit: Payer: Self-pay

## 2024-03-03 ENCOUNTER — Telehealth: Payer: Self-pay

## 2024-03-03 ENCOUNTER — Telehealth: Admitting: Family Medicine

## 2024-03-03 ENCOUNTER — Other Ambulatory Visit: Payer: Self-pay

## 2024-03-03 DIAGNOSIS — K047 Periapical abscess without sinus: Secondary | ICD-10-CM | POA: Diagnosis not present

## 2024-03-03 MED ORDER — PENICILLIN V POTASSIUM 500 MG PO TABS
500.0000 mg | ORAL_TABLET | Freq: Three times a day (TID) | ORAL | 0 refills | Status: AC
Start: 2024-03-03 — End: 2024-03-13
  Filled 2024-03-03: qty 30, 10d supply, fill #0

## 2024-03-03 NOTE — Telephone Encounter (Signed)
 Copied from CRM #[id number]. Topic: General - Billing Inquiry >> Mar 03, 2024  9:23 AM Myrick T wrote: Reason for CRM: patient called to see if she has a balance. Please leave message if she does not answer

## 2024-03-03 NOTE — Progress Notes (Signed)

## 2024-03-03 NOTE — Telephone Encounter (Signed)
 Left vm.

## 2024-03-06 ENCOUNTER — Encounter: Payer: Self-pay | Admitting: Family Medicine

## 2024-03-06 ENCOUNTER — Ambulatory Visit: Admitting: Family Medicine

## 2024-03-06 ENCOUNTER — Other Ambulatory Visit: Payer: Self-pay

## 2024-03-06 VITALS — BP 125/77 | HR 63 | Ht 67.0 in | Wt 351.5 lb

## 2024-03-06 DIAGNOSIS — T148XXA Other injury of unspecified body region, initial encounter: Secondary | ICD-10-CM | POA: Diagnosis not present

## 2024-03-06 DIAGNOSIS — M7989 Other specified soft tissue disorders: Secondary | ICD-10-CM

## 2024-03-06 DIAGNOSIS — Z1231 Encounter for screening mammogram for malignant neoplasm of breast: Secondary | ICD-10-CM

## 2024-03-06 DIAGNOSIS — R233 Spontaneous ecchymoses: Secondary | ICD-10-CM

## 2024-03-06 DIAGNOSIS — E559 Vitamin D deficiency, unspecified: Secondary | ICD-10-CM

## 2024-03-06 DIAGNOSIS — R2242 Localized swelling, mass and lump, left lower limb: Secondary | ICD-10-CM

## 2024-03-06 DIAGNOSIS — N920 Excessive and frequent menstruation with regular cycle: Secondary | ICD-10-CM

## 2024-03-06 DIAGNOSIS — I10 Essential (primary) hypertension: Secondary | ICD-10-CM | POA: Diagnosis not present

## 2024-03-06 MED ORDER — TOPIRAMATE 25 MG PO TABS
25.0000 mg | ORAL_TABLET | Freq: Two times a day (BID) | ORAL | 2 refills | Status: AC
  Filled 2024-03-06: qty 60, 30d supply, fill #0

## 2024-03-06 MED ORDER — METOPROLOL SUCCINATE ER 100 MG PO TB24
100.0000 mg | ORAL_TABLET | Freq: Every day | ORAL | 0 refills | Status: DC
  Filled 2024-03-06: qty 30, 30d supply, fill #0

## 2024-03-06 NOTE — Patient Instructions (Addendum)
 [facility] [address] [place],  [place]  [address] Main: [phone]    To keep you healthy, please keep in mind the following health maintenance items that you are due for:   Health Maintenance Due  Topic Date Due   Mammogram  Never done   Pneumococcal Vaccine (1 of 2 - PCV) Never done   Hepatitis B Vaccines 19-59 Average Risk (1 of 3 - 19+ 3-dose series) Never done   HPV VACCINES (1 - 3-dose SCDM series) Never done     Best Wishes,   Dr. Lang

## 2024-03-06 NOTE — Progress Notes (Signed)
 Established patient visit   Patient: Nancy Garza   DOB: 20-Dec-1980   43 y.o. Female  MRN: [id number] Visit Date: 03/06/2024  Today's healthcare provider: Rockie Agent, MD   Chief Complaint  Patient presents with   Menorrhagia    Patient reports over the past few years she has been experiencing this. States she had a procedure done last year after her cervix was unable to be found during a pap. Patient states she is checking her pads every hour to make sure she is not bleeding through, this happens around the first two days. Cramping and noticing clots as well.    Medication Refill    Would like to restart phentermine , states this did help last time she took this and stuck to an exercise routine    Subjective     HPI     Menorrhagia    Additional comments: Patient reports over the past few years she has been experiencing this. States she had a procedure done last year after her cervix was unable to be found during a pap. Patient states she is checking her pads every hour to make sure she is not bleeding through, this happens around the first two days. Cramping and noticing clots as well.         Medication Refill    Additional comments: Would like to restart phentermine , states this did help last time she took this and stuck to an exercise routine       Last edited by Cherry Chiquita HERO, CMA on 03/06/2024  8:49 AM.       Discussed the use of AI scribe software for clinical note transcription with the patient, who gave verbal consent to proceed.  History of Present Illness Nancy Garza is a 43 year old female with severe obesity and chronic hypertension who presents with menorrhagia and a request to restart phentermine  for weight loss.  She experiences menorrhagia with regular periods for several years, describing the bleeding as heavy, requiring frequent pad changes, and accompanied by severe cramping and blood clots during the first two days. A  previous hysteroscopy indicated a thickened endometrial wall without clear fibroids. Her periods are regular, with the heaviest flow on the second and third days, necessitating the use of a pull-up. Post-menstruation, she notices a sticky, clear discharge.  She has a history of severe obesity with a BMI of 55 and wishes to restart phentermine  for weight loss, having previously found it effective. Her weight was 339 pounds while on phentermine , and she has since stopped gym activities due to caregiving responsibilities.  She has chronic hypertension, currently well-controlled with metoprolol  100 mg daily.  She reports easy bruising and has noticed a 'nodule' on her leg, with no recent trauma or known cause for the bruising.  She is due for a mammogram for breast cancer screening.     Past Medical History:  Diagnosis Date   Abnormal Pap smear of cervix    LEEP procedure   Abnormal uterine bleeding 11/2022   Anxiety    Depression    Hyperlipidemia    Hypertension    Iron deficiency anemia    Morbid obesity with BMI of 50.0-59.9, adult (HCC)    Pneumonia    Pre-diabetes    Vitamin D  deficiency     Medications: Outpatient Medications Prior to Visit  Medication Sig   amLODipine  (NORVASC ) 5 MG tablet Take 1 tablet (5 mg total) by mouth daily.   busPIRone  (BUSPAR ) 10  MG tablet Take 1 tablet (10 mg total) by mouth 2 (two) times daily.   citalopram  (CELEXA ) 40 MG tablet Take 1 tablet (40 mg total) by mouth daily.   ferrous sulfate  325 (65 FE) MG tablet Take 1 tablet (325 mg total) by mouth daily with breakfast.   penicillin v potassium (VEETID) 500 MG tablet Take 1 tablet (500 mg total) by mouth 3 (three) times daily for 10 days.   phentermine  30 MG capsule Take 1 capsule (30 mg total) by mouth every morning.   valsartan -hydrochlorothiazide  (DIOVAN -HCT) 320-25 MG tablet Take 1 tablet by mouth daily.   [DISCONTINUED] metoprolol  succinate (TOPROL -XL) 100 MG 24 hr tablet Take 1 tablet (100  mg total) by mouth daily. Take with or immediately following a meal.   [DISCONTINUED] amoxicillin -clavulanate (AUGMENTIN ) 875-125 MG tablet Take 1 tablet by mouth 2 (two) times daily.   [DISCONTINUED] Blood Pressure Monitor MISC use as directed   [DISCONTINUED] ibuprofen  (ADVIL ) 600 MG tablet Take 1 tablet (600 mg total) by mouth every 8 (eight) hours as needed.   [DISCONTINUED] Naltrexone -buPROPion  HCl ER (CONTRAVE ) 8-90 MG TB12 Start 1 tablet every morning for 7 days, then 1 tablet twice daily for 7 days, then 2 tablets every morning and one every evening   [DISCONTINUED] Vitamin D , Ergocalciferol , (DRISDOL ) 1.25 MG (50000 UNIT) CAPS capsule Take 1 capsule (50,000 Units total) by mouth every 7 (seven) days. (Patient not taking: Reported on 06/23/2023)   No facility-administered medications prior to visit.    Review of Systems  Last CBC Lab Results  Component Value Date   WBC 6.2 06/03/2023   HGB 11.7 06/03/2023   HCT 35.0 06/03/2023   MCV 94 06/03/2023   MCH 31.3 06/03/2023   RDW 14.9 06/03/2023   PLT 345 06/03/2023   Last metabolic panel Lab Results  Component Value Date   GLUCOSE 92 06/03/2023   NA 140 06/03/2023   K 4.7 06/03/2023   CL 101 06/03/2023   CO2 26 06/03/2023   BUN 15 06/03/2023   CREATININE 0.72 06/03/2023   EGFR 108 06/03/2023   CALCIUM 9.4 06/03/2023   PROT 7.2 06/03/2023   ALBUMIN 4.1 06/03/2023   LABGLOB 3.1 06/03/2023   AGRATIO 1.3 06/29/2022   BILITOT <0.2 06/03/2023   ALKPHOS 56 06/03/2023   AST 16 06/03/2023   ALT 15 06/03/2023   Last lipids Lab Results  Component Value Date   CHOL 171 06/29/2022   HDL 40 06/29/2022   LDLCALC 88 06/29/2022   TRIG 259 (H) 06/29/2022   CHOLHDL 4.3 06/29/2022   Last hemoglobin A1c Lab Results  Component Value Date   HGBA1C 5.6 06/03/2023   Last thyroid  functions Lab Results  Component Value Date   TSH 2.360 06/03/2023   T4TOTAL 5.9 09/07/2022   THYROIDAB 15 09/07/2022   Last vitamin D  Lab Results   Component Value Date   VD25OH 14.3 (L) 09/07/2022   Last vitamin B12 and Folate Lab Results  Component Value Date   VITAMINB12 662 09/07/2022   FOLATE 6.7 09/07/2022        Objective    BP 125/77 (BP Location: Right Arm, Patient Position: Sitting, Cuff Size: Normal)   Pulse 63   Ht 5' 7 (1.702 m)   Wt (!) 351 lb 8 oz (159.4 kg)   LMP 02/27/2024 (Exact Date)   SpO2 100%   BMI 55.05 kg/m   BP Readings from Last 3 Encounters:  03/06/24 125/77  06/23/23 135/80  06/03/23 (!) 162/86   Wt Readings  from Last 3 Encounters:  03/06/24 (!) 351 lb 8 oz (159.4 kg)  06/23/23 (!) 375 lb (170.1 kg)  06/03/23 (!) 376 lb (170.6 kg)        Physical Exam Vitals reviewed.  Constitutional:      General: She is not in acute distress.    Appearance: Normal appearance. She is not ill-appearing.  Cardiovascular:     Rate and Rhythm: Normal rate and regular rhythm.  Pulmonary:     Effort: Pulmonary effort is normal. No respiratory distress.     Breath sounds: No wheezing, rhonchi or rales.  Musculoskeletal:        General: Swelling, tenderness and deformity present.     Left lower leg: Edema present.  Neurological:     Mental Status: She is alert and oriented to person, place, and time.  Psychiatric:        Mood and Affect: Mood normal.        Behavior: Behavior normal.       No results found for any visits on 03/06/24.  Assessment & Plan     Problem List Items Addressed This Visit     HTN (hypertension)   Relevant Medications   metoprolol  succinate (TOPROL -XL) 100 MG 24 hr tablet   Morbid obesity (HCC)   Relevant Medications   topiramate (TOPAMAX) 25 MG tablet   Vitamin D  deficiency   Relevant Orders   Vitamin D  (25 hydroxy)   Other Visit Diagnoses       Menorrhagia with regular cycle    -  Primary   Relevant Orders   Ambulatory referral to Gynecology   US  Pelvic Complete With Transvaginal     Benign hypertension       Relevant Medications   metoprolol   succinate (TOPROL -XL) 100 MG 24 hr tablet     Encounter for screening mammogram for malignant neoplasm of breast       Relevant Orders   MM 3D SCREENING MAMMOGRAM BILATERAL BREAST     Nodule of skin of left lower leg       Relevant Orders   US  Venous Img Lower Bilateral (DVT)     Bruising       Relevant Orders   Vitamin K1, Serum   CBC w/Diff/Platelet     Left leg swelling       Relevant Orders   US  Venous Img Lower Bilateral (DVT)        Assessment & Plan Chronic menorrhagia with regular cycles Severe cramping and blood clots. Previous hysteroscopy showed thickened endometrial lining and inflammation, negative for malignancy. Differential includes fibroids, adenomyosis, and thickened endometrial lining. - Discussed hormonal treatment options and surgical interventions. She prefers to avoid hysterectomy and is open to hormonal treatment. - Explained that birth control pills may help regulate bleeding by stabilizing the endometrium. - Refer to gynecology for further evaluation. - Order transvaginal ultrasound. - Start birth control pill to manage bleeding.  Left leg nodule with tenderness and swelling New onset left leg nodule with tenderness and swelling. Differential includes DVT. - Order DVT ultrasound of left leg.  Morbid obesity Severe obesity with BMI of 55. Previous use of phentermine  was effective for weight loss but increased blood pressure. Discussed alternative weight management options due to potential cardiovascular risks with phentermine . She is interested in trying Topamax for appetite suppression. - Start Topamax 25 mg twice daily for weight management.  Essential hypertension Chronic hypertension, currently well-controlled with metoprolol . Blood pressure today is 125/77 mmHg. Discussed potential impact of  phentermine  on blood pressure and decided against its use. - Continue metoprolol  100 mg daily.  General Health Maintenance Discussed general health  maintenance including vaccinations and screenings appropriate for age and health status. - Administer hepatitis B vaccine. - Administer pneumococcal vaccine. - Order mammogram for breast cancer screening.     Return in about 3 months (around 06/06/2024) for Weight MGMT.     Total time spent on today's visit was 53 minutes, including both face-to-face time interviewing and examining the patient, reviewing medical record including labs/imaging/specialist notes, developing and discussing further evaluation,answering patient's questions, ordering referrals to specialists, coordinating follow up care in addition to documenting in the patient's chart.     Rockie Agent, MD  [facility] [phone] (phone) [phone] (fax)  [facility]

## 2024-03-08 ENCOUNTER — Ambulatory Visit
Admission: RE | Admit: 2024-03-08 | Discharge: 2024-03-08 | Disposition: A | Discharge status: Home / Self Care | Source: Ambulatory Visit | Attending: Family Medicine | Admitting: Family Medicine

## 2024-03-08 DIAGNOSIS — N920 Excessive and frequent menstruation with regular cycle: Secondary | ICD-10-CM | POA: Diagnosis not present

## 2024-03-08 DIAGNOSIS — R2242 Localized swelling, mass and lump, left lower limb: Secondary | ICD-10-CM | POA: Diagnosis not present

## 2024-03-08 DIAGNOSIS — M7989 Other specified soft tissue disorders: Secondary | ICD-10-CM | POA: Diagnosis not present

## 2024-03-08 DIAGNOSIS — M79605 Pain in left leg: Secondary | ICD-10-CM | POA: Diagnosis not present

## 2024-03-09 LAB — VITAMIN D 25 HYDROXY (VIT D DEFICIENCY, FRACTURES): Vit D, 25-Hydroxy: 20.8 ng/mL — ABNORMAL LOW (ref 30.0–100.0)

## 2024-03-09 LAB — CBC WITH DIFFERENTIAL/PLATELET
Basophils Absolute: 0.1 x10E3/uL (ref 0.0–0.2)
Basos: 1 %
EOS (ABSOLUTE): 0.5 x10E3/uL — ABNORMAL HIGH (ref 0.0–0.4)
Eos: 10 %
Hematocrit: 34.8 % (ref 34.0–46.6)
Hemoglobin: 11 g/dL — ABNORMAL LOW (ref 11.1–15.9)
Immature Grans (Abs): 0 x10E3/uL (ref 0.0–0.1)
Immature Granulocytes: 0 %
Lymphocytes Absolute: 1.6 x10E3/uL (ref 0.7–3.1)
Lymphs: 31 %
MCH: 29.6 pg (ref 26.6–33.0)
MCHC: 31.6 g/dL (ref 31.5–35.7)
MCV: 94 fL (ref 79–97)
Monocytes Absolute: 0.5 x10E3/uL (ref 0.1–0.9)
Monocytes: 9 %
Neutrophils Absolute: 2.6 x10E3/uL (ref 1.4–7.0)
Neutrophils: 49 %
Platelets: 378 x10E3/uL (ref 150–450)
RBC: 3.72 x10E6/uL — ABNORMAL LOW (ref 3.77–5.28)
RDW: 14.8 % (ref 11.7–15.4)
WBC: 5.2 x10E3/uL (ref 3.4–10.8)

## 2024-03-09 LAB — VITAMIN K1, SERUM: VITAMIN K1: 0.83 ng/mL (ref 0.10–2.20)

## 2024-03-13 ENCOUNTER — Ambulatory Visit: Payer: Self-pay | Admitting: Family Medicine

## 2024-03-15 ENCOUNTER — Other Ambulatory Visit: Payer: Self-pay | Admitting: Medical Genetics

## 2024-03-15 DIAGNOSIS — Z006 Encounter for examination for normal comparison and control in clinical research program: Secondary | ICD-10-CM

## 2024-03-17 NOTE — Telephone Encounter (Signed)
 Copied from CRM [id number]. Topic: Clinical - Lab/Test Results >> Mar 17, 2024  8:50 AM Rosaria BRAVO wrote: Reason for CRM: Pt called back to report that she has reviewed her lab results on mychart and wants to speak to her PCP. Says she does not like what she saw. She knows something must be done.   Best contact: [phone]

## 2024-03-22 ENCOUNTER — Ambulatory Visit
Admission: RE | Admit: 2024-03-22 | Discharge: 2024-03-22 | Disposition: A | Discharge status: Home / Self Care | Source: Ambulatory Visit | Attending: Family Medicine | Admitting: Family Medicine

## 2024-03-22 DIAGNOSIS — Z1231 Encounter for screening mammogram for malignant neoplasm of breast: Secondary | ICD-10-CM | POA: Diagnosis not present

## 2024-03-23 ENCOUNTER — Telehealth: Payer: Self-pay

## 2024-03-23 DIAGNOSIS — D250 Submucous leiomyoma of uterus: Secondary | ICD-10-CM

## 2024-03-23 NOTE — Addendum Note (Signed)
 Addended by: CHERRY CHIQUITA HERO on: 03/23/2024 12:48 PM   Modules accepted: Orders

## 2024-03-23 NOTE — Addendum Note (Signed)
 Addended by: [name], Ambri Miltner L on: 03/23/2024 01:29 PM   Modules accepted: Orders

## 2024-03-23 NOTE — Telephone Encounter (Signed)
   she does not have blood clots in the legs.   She does have an area in her calf that looks similar to a hematoma  (basically bruising) but imaging was unable to complete characterize. It does not appear to be an abscess   We can order a CT of the left calf to obtain more details about the area or refer to general surgery for evaluation and recommendations. Please let me know patient's preference

## 2024-03-23 NOTE — Telephone Encounter (Signed)
 Spoke with patient, she is requesting a new referral to OBGYN office within [facility] system. Referral pended for you

## 2024-03-23 NOTE — Telephone Encounter (Signed)
 Copied from CRM [id number]. Topic: Clinical - Lab/Test Results >> Mar 22, 2024 11:28 AM Ivette P wrote: Reason for CRM: PT had an 2 ultra sounds done on 10/08 and saw results on my chart and shows abnormal.   Pt said no one has reached out to explain so she would like to go over them and speak to someone regarding her results.   Called [facility] and spoke to [place], will forward to provider to make aware and have someone reach out to pt before end of day. >> Mar 23, 2024  9:10 AM Amy B wrote: Patient called again stating no one has called her regarding her abnormal ultrasound results.  Attempted warm transfer to [facility] and was advised no one was available to speak to her.   >> Mar 22, 2024  4:54 PM Lauren C wrote: Pt calling back, upset that the day is about to end and she has not received a call yet about her abnormal ultrasound results from 10/8.

## 2024-03-23 NOTE — Telephone Encounter (Signed)
 Copied from CRM [id number]. Topic: Clinical - Lab/Test Results >> Mar 22, 2024 11:28 AM Ivette P wrote: Reason for CRM: PT had an 2 ultra sounds done on 10/08 and saw results on my chart and shows abnormal.   Pt said no one has reached out to explain so she would like to go over them and speak to someone regarding her results.   Called [facility] and spoke to [place], will forward to provider to make aware and have someone reach out to pt before end of day. >> Mar 22, 2024  4:54 PM Lauren C wrote: Pt calling back, upset that the day is about to end and she has not received a call yet about her abnormal ultrasound results from 10/8.

## 2024-03-28 ENCOUNTER — Encounter: Payer: Self-pay | Admitting: Family Medicine

## 2024-03-28 DIAGNOSIS — D252 Subserosal leiomyoma of uterus: Secondary | ICD-10-CM

## 2024-03-31 DIAGNOSIS — E611 Iron deficiency: Secondary | ICD-10-CM | POA: Diagnosis not present

## 2024-03-31 DIAGNOSIS — D250 Submucous leiomyoma of uterus: Secondary | ICD-10-CM | POA: Diagnosis not present

## 2024-04-04 ENCOUNTER — Ambulatory Visit: Admitting: Family Medicine

## 2024-04-07 ENCOUNTER — Other Ambulatory Visit: Payer: Self-pay | Admitting: Family Medicine

## 2024-04-07 ENCOUNTER — Other Ambulatory Visit: Payer: Self-pay

## 2024-04-07 DIAGNOSIS — I10 Essential (primary) hypertension: Secondary | ICD-10-CM

## 2024-04-07 MED FILL — Metoprolol Succinate Tab ER 24HR 100 MG (Tartrate Equiv): ORAL | 90 days supply | Qty: 90 | Fill #0 | Status: AC

## 2024-04-17 DIAGNOSIS — D250 Submucous leiomyoma of uterus: Secondary | ICD-10-CM | POA: Diagnosis not present

## 2024-04-17 DIAGNOSIS — D252 Subserosal leiomyoma of uterus: Secondary | ICD-10-CM | POA: Diagnosis not present

## 2024-04-21 ENCOUNTER — Other Ambulatory Visit: Payer: Self-pay | Admitting: Family Medicine

## 2024-04-21 ENCOUNTER — Other Ambulatory Visit: Payer: Self-pay

## 2024-04-21 DIAGNOSIS — F32A Depression, unspecified: Secondary | ICD-10-CM

## 2024-04-24 ENCOUNTER — Other Ambulatory Visit: Payer: Self-pay

## 2024-04-24 MED ORDER — CITALOPRAM HYDROBROMIDE 40 MG PO TABS
40.0000 mg | ORAL_TABLET | Freq: Every day | ORAL | 1 refills | Status: AC
  Filled 2024-04-24: qty 90, 90d supply, fill #0
  Filled 2024-05-01: qty 30, 30d supply, fill #0
  Filled 2024-06-22: qty 30, 30d supply, fill #1

## 2024-05-01 ENCOUNTER — Other Ambulatory Visit: Payer: Self-pay

## 2024-05-12 ENCOUNTER — Other Ambulatory Visit: Payer: Self-pay

## 2024-05-12 ENCOUNTER — Encounter: Payer: Self-pay | Admitting: Family Medicine

## 2024-05-12 ENCOUNTER — Telehealth: Admitting: Family Medicine

## 2024-05-12 DIAGNOSIS — K047 Periapical abscess without sinus: Secondary | ICD-10-CM

## 2024-05-12 MED ORDER — AMOXICILLIN-POT CLAVULANATE 875-125 MG PO TABS
1.0000 | ORAL_TABLET | Freq: Two times a day (BID) | ORAL | 0 refills | Status: AC
  Filled 2024-05-12: qty 20, 10d supply, fill #0

## 2024-05-12 NOTE — Progress Notes (Signed)
 E-Visit for Dental Pain  We are sorry that you are not feeling well.  Here is how we plan to help!  Based on what you have shared with me in the questionnaire, it sounds like you have a dental infection.   Augmentin  875-125mg  twice a day for 7 days  It is imperative that you see a dentist within 10 days of this eVisit to determine the cause of the dental pain and be sure it is adequately treated  A toothache or tooth pain is caused when the nerve in the root of a tooth or surrounding a tooth is irritated. Dental (tooth) infection, decay, injury, or loss of a tooth are the most common causes of dental pain. Pain may also occur after an extraction (tooth is pulled out). Pain sometimes originates from other areas and radiates to the jaw, thus appearing to be tooth pain.Bacteria growing inside your mouth can contribute to gum disease and dental decay, both of which can cause pain. A toothache occurs from inflammation of the central portion of the tooth called pulp. The pulp contains nerve endings that are very sensitive to pain. Inflammation to the pulp or pulpitis may be caused by dental cavities, trauma, and infection.    HOME CARE:   For toothaches: Over-the-counter pain medications such as acetaminophen  or ibuprofen  may be used. Take these as directed on the package while you arrange for a dental appointment. Avoid very cold or hot foods, because they may make the pain worse. You may get relief from biting on a cotton ball soaked in oil of cloves. You can get oil of cloves at most drug stores.  For jaw pain:  Aspirin may be helpful for problems in the joint of the jaw in adults. If pain happens every time you open your mouth widely, the temporomandibular joint (TMJ) may be the source of the pain. Yawning or taking a large bite of food may worsen the pain. An appointment with your doctor or dentist will help you find the cause.     GET HELP RIGHT AWAY IF:  You have a high fever or  chills If you have had a recent head or face injury and develop headache, light headedness, nausea, vomiting, or other symptoms that concern you after an injury to your face or mouth, you could have a more serious injury in addition to your dental injury. A facial rash associated with a toothache: This condition may improve with medication. Contact your doctor for them to decide what is appropriate. Any jaw pain occurring with chest pain: Although jaw pain is most commonly caused by dental disease, it is sometimes referred pain from other areas. People with heart disease, especially people who have had stents placed, people with diabetes, or those who have had heart surgery may have jaw pain as a symptom of heart attack or angina. If your jaw or tooth pain is associated with lightheadedness, sweating, or shortness of breath, you should see a doctor as soon as possible. Trouble swallowing or excessive pain or bleeding from gums: If you have a history of a weakened immune system, diabetes, or steroid use, you may be more susceptible to infections. Infections can often be more severe and extensive or caused by unusual organisms. Dental and gum infections in people with these conditions may require more aggressive treatment. An abscess may need draining or IV antibiotics, for example.  MAKE SURE YOU   Understand these instructions. Will watch your condition. Will get help right away if you  are not doing well or get worse.  Thank you for choosing an e-visit.  Your e-visit answers were reviewed by a board certified advanced clinical practitioner to complete your personal care plan. Depending upon the condition, your plan could have included both over the counter or prescription medications.  Please review your pharmacy choice. Make sure the pharmacy is open so you can pick up prescription now. If there is a problem, you may contact your provider through [employer] and have the prescription routed to  another pharmacy.  Your safety is important to us . If you have drug allergies check your prescription carefully.   For the next 24 hours you can use MyChart to ask questions about today's visit, request a non-urgent call back, or ask for a work or school excuse. You will get an email in the next two days asking about your experience. I hope that your e-visit has been valuable and will speed your recovery.  I have spent 5 minutes in review of e-visit questionnaire, review and updating patient chart, medical decision making and response to patient.   Sabrinna Yearwood, FNP

## 2024-05-16 ENCOUNTER — Other Ambulatory Visit: Payer: Self-pay

## 2024-05-16 ENCOUNTER — Other Ambulatory Visit: Payer: Self-pay | Admitting: Family Medicine

## 2024-05-16 DIAGNOSIS — I10 Essential (primary) hypertension: Secondary | ICD-10-CM

## 2024-05-16 MED ORDER — AMLODIPINE BESYLATE 5 MG PO TABS
5.0000 mg | ORAL_TABLET | Freq: Every day | ORAL | 0 refills | Status: DC
  Filled 2024-05-16: qty 30, 30d supply, fill #0

## 2024-06-07 ENCOUNTER — Ambulatory Visit: Admitting: Family Medicine

## 2024-06-22 ENCOUNTER — Other Ambulatory Visit: Payer: Self-pay | Admitting: Family Medicine

## 2024-06-22 ENCOUNTER — Other Ambulatory Visit: Payer: Self-pay

## 2024-06-22 DIAGNOSIS — I10 Essential (primary) hypertension: Secondary | ICD-10-CM

## 2024-06-22 MED ORDER — AMLODIPINE BESYLATE 5 MG PO TABS
5.0000 mg | ORAL_TABLET | Freq: Every day | ORAL | 0 refills | Status: AC
  Filled 2024-06-22: qty 90, 90d supply, fill #0
